# Patient Record
Sex: Female | Born: 1955 | Race: Black or African American | Hispanic: No | Marital: Married | Smoking: Never smoker
Health system: Southern US, Community
[De-identification: ages and names within clinical notes are randomized; demographics above are authoritative.]

## PROBLEM LIST (undated history)

## (undated) DIAGNOSIS — F419 Anxiety disorder, unspecified: Secondary | ICD-10-CM

## (undated) DIAGNOSIS — M542 Cervicalgia: Secondary | ICD-10-CM

## (undated) DIAGNOSIS — G629 Polyneuropathy, unspecified: Secondary | ICD-10-CM

## (undated) DIAGNOSIS — R768 Other specified abnormal immunological findings in serum: Secondary | ICD-10-CM

## (undated) DIAGNOSIS — E785 Hyperlipidemia, unspecified: Secondary | ICD-10-CM

## (undated) DIAGNOSIS — D649 Anemia, unspecified: Secondary | ICD-10-CM

## (undated) DIAGNOSIS — R7689 Other specified abnormal immunological findings in serum: Secondary | ICD-10-CM

## (undated) DIAGNOSIS — J189 Pneumonia, unspecified organism: Secondary | ICD-10-CM

## (undated) DIAGNOSIS — M1990 Unspecified osteoarthritis, unspecified site: Secondary | ICD-10-CM

## (undated) DIAGNOSIS — T8859XA Other complications of anesthesia, initial encounter: Secondary | ICD-10-CM

## (undated) DIAGNOSIS — E042 Nontoxic multinodular goiter: Secondary | ICD-10-CM

## (undated) DIAGNOSIS — T4145XA Adverse effect of unspecified anesthetic, initial encounter: Secondary | ICD-10-CM

## (undated) DIAGNOSIS — IMO0002 Reserved for concepts with insufficient information to code with codable children: Secondary | ICD-10-CM

## (undated) DIAGNOSIS — M797 Fibromyalgia: Secondary | ICD-10-CM

## (undated) DIAGNOSIS — K219 Gastro-esophageal reflux disease without esophagitis: Secondary | ICD-10-CM

## (undated) HISTORY — DX: Nontoxic multinodular goiter: E04.2

## (undated) HISTORY — DX: Other specified abnormal immunological findings in serum: R76.89

## (undated) HISTORY — DX: Hyperlipidemia, unspecified: E78.5

## (undated) HISTORY — DX: Fibromyalgia: M79.7

## (undated) HISTORY — DX: Other specified abnormal immunological findings in serum: R76.8

## (undated) HISTORY — DX: Cervicalgia: M54.2

## (undated) HISTORY — DX: Polyneuropathy, unspecified: G62.9

## (undated) HISTORY — PX: DILATION AND CURETTAGE OF UTERUS: SHX78

## (undated) HISTORY — DX: Reserved for concepts with insufficient information to code with codable children: IMO0002

## (undated) HISTORY — DX: Anemia, unspecified: D64.9

## (undated) HISTORY — PX: TONSILLECTOMY: SUR1361

## (undated) HISTORY — PX: ABDOMINAL HYSTERECTOMY: SUR658

## (undated) HISTORY — PX: ABDOMINAL HYSTERECTOMY: SHX81

## (undated) HISTORY — DX: Unspecified osteoarthritis, unspecified site: M19.90

---

## 1997-10-09 ENCOUNTER — Encounter: Payer: Self-pay | Admitting: Emergency Medicine

## 1997-10-10 ENCOUNTER — Inpatient Hospital Stay (HOSPITAL_COMMUNITY): Admission: EM | Admit: 1997-10-10 | Discharge: 1997-10-10 | Payer: Self-pay | Admitting: Emergency Medicine

## 1998-01-31 ENCOUNTER — Ambulatory Visit (HOSPITAL_COMMUNITY): Admission: RE | Admit: 1998-01-31 | Discharge: 1998-01-31 | Payer: Self-pay | Admitting: Psychology

## 1998-09-12 ENCOUNTER — Encounter: Payer: Self-pay | Admitting: Internal Medicine

## 1998-09-12 ENCOUNTER — Ambulatory Visit (HOSPITAL_COMMUNITY): Admission: RE | Admit: 1998-09-12 | Discharge: 1998-09-12 | Payer: Self-pay | Admitting: Internal Medicine

## 1998-10-08 ENCOUNTER — Other Ambulatory Visit: Admission: RE | Admit: 1998-10-08 | Discharge: 1998-10-08 | Payer: Self-pay | Admitting: Obstetrics and Gynecology

## 1999-11-30 ENCOUNTER — Other Ambulatory Visit: Admission: RE | Admit: 1999-11-30 | Discharge: 1999-11-30 | Payer: Self-pay | Admitting: Obstetrics and Gynecology

## 2000-01-19 HISTORY — PX: MYOMECTOMY: SHX85

## 2000-03-13 ENCOUNTER — Encounter: Payer: Self-pay | Admitting: Internal Medicine

## 2000-03-13 ENCOUNTER — Ambulatory Visit (HOSPITAL_COMMUNITY): Admission: RE | Admit: 2000-03-13 | Discharge: 2000-03-13 | Payer: Self-pay | Admitting: Neurology

## 2000-03-13 ENCOUNTER — Encounter: Payer: Self-pay | Admitting: Neurology

## 2000-03-19 ENCOUNTER — Ambulatory Visit (HOSPITAL_COMMUNITY): Admission: RE | Admit: 2000-03-19 | Discharge: 2000-03-19 | Payer: Self-pay | Admitting: Orthopedic Surgery

## 2000-03-19 ENCOUNTER — Encounter: Payer: Self-pay | Admitting: Orthopedic Surgery

## 2000-08-17 ENCOUNTER — Encounter (INDEPENDENT_AMBULATORY_CARE_PROVIDER_SITE_OTHER): Payer: Self-pay

## 2000-08-17 ENCOUNTER — Ambulatory Visit (HOSPITAL_COMMUNITY): Admission: RE | Admit: 2000-08-17 | Discharge: 2000-08-17 | Payer: Self-pay | Admitting: Obstetrics and Gynecology

## 2000-10-26 ENCOUNTER — Inpatient Hospital Stay (HOSPITAL_COMMUNITY): Admission: RE | Admit: 2000-10-26 | Discharge: 2000-10-28 | Payer: Self-pay | Admitting: Obstetrics and Gynecology

## 2000-10-26 ENCOUNTER — Encounter (INDEPENDENT_AMBULATORY_CARE_PROVIDER_SITE_OTHER): Payer: Self-pay

## 2001-01-25 ENCOUNTER — Encounter (INDEPENDENT_AMBULATORY_CARE_PROVIDER_SITE_OTHER): Payer: Self-pay

## 2001-01-25 ENCOUNTER — Observation Stay (HOSPITAL_COMMUNITY): Admission: RE | Admit: 2001-01-25 | Discharge: 2001-01-26 | Payer: Self-pay | Admitting: Obstetrics and Gynecology

## 2002-01-18 HISTORY — PX: KNEE ARTHROSCOPY: SUR90

## 2002-12-21 ENCOUNTER — Emergency Department (HOSPITAL_COMMUNITY): Admission: EM | Admit: 2002-12-21 | Discharge: 2002-12-21 | Payer: Self-pay | Admitting: Emergency Medicine

## 2002-12-21 ENCOUNTER — Ambulatory Visit (HOSPITAL_COMMUNITY): Admission: RE | Admit: 2002-12-21 | Discharge: 2002-12-21 | Payer: Self-pay | Admitting: Orthopedic Surgery

## 2003-01-30 ENCOUNTER — Encounter: Payer: Self-pay | Admitting: Internal Medicine

## 2003-03-12 ENCOUNTER — Other Ambulatory Visit: Admission: RE | Admit: 2003-03-12 | Discharge: 2003-03-12 | Payer: Self-pay | Admitting: Obstetrics and Gynecology

## 2003-12-09 ENCOUNTER — Encounter: Payer: Self-pay | Admitting: Internal Medicine

## 2004-05-13 ENCOUNTER — Ambulatory Visit: Payer: Self-pay | Admitting: Internal Medicine

## 2004-05-19 ENCOUNTER — Ambulatory Visit: Payer: Self-pay | Admitting: Internal Medicine

## 2005-03-08 ENCOUNTER — Ambulatory Visit: Payer: Self-pay | Admitting: Internal Medicine

## 2005-03-10 ENCOUNTER — Ambulatory Visit (HOSPITAL_COMMUNITY): Admission: RE | Admit: 2005-03-10 | Discharge: 2005-03-10 | Payer: Self-pay | Admitting: Internal Medicine

## 2005-05-18 LAB — CONVERTED CEMR LAB: Pap Smear: NORMAL

## 2005-05-19 ENCOUNTER — Encounter: Payer: Self-pay | Admitting: Internal Medicine

## 2005-06-09 ENCOUNTER — Ambulatory Visit: Payer: Self-pay | Admitting: Cardiology

## 2005-06-09 ENCOUNTER — Emergency Department (HOSPITAL_COMMUNITY): Admission: EM | Admit: 2005-06-09 | Discharge: 2005-06-10 | Payer: Self-pay | Admitting: Emergency Medicine

## 2005-06-21 ENCOUNTER — Ambulatory Visit: Payer: Self-pay | Admitting: Internal Medicine

## 2005-06-22 ENCOUNTER — Ambulatory Visit: Payer: Self-pay | Admitting: Internal Medicine

## 2005-07-01 ENCOUNTER — Ambulatory Visit: Payer: Self-pay

## 2006-05-03 ENCOUNTER — Ambulatory Visit: Payer: Self-pay | Admitting: Internal Medicine

## 2006-10-31 IMAGING — CR DG CHEST 2V
2 series · 2 of 2 positions shown · non-contrast
Comparison: None.

CLINICAL DATA: 49-year-old with chest pain and anxiety.  
 CHEST - 2 VIEW:

[w chest pa]
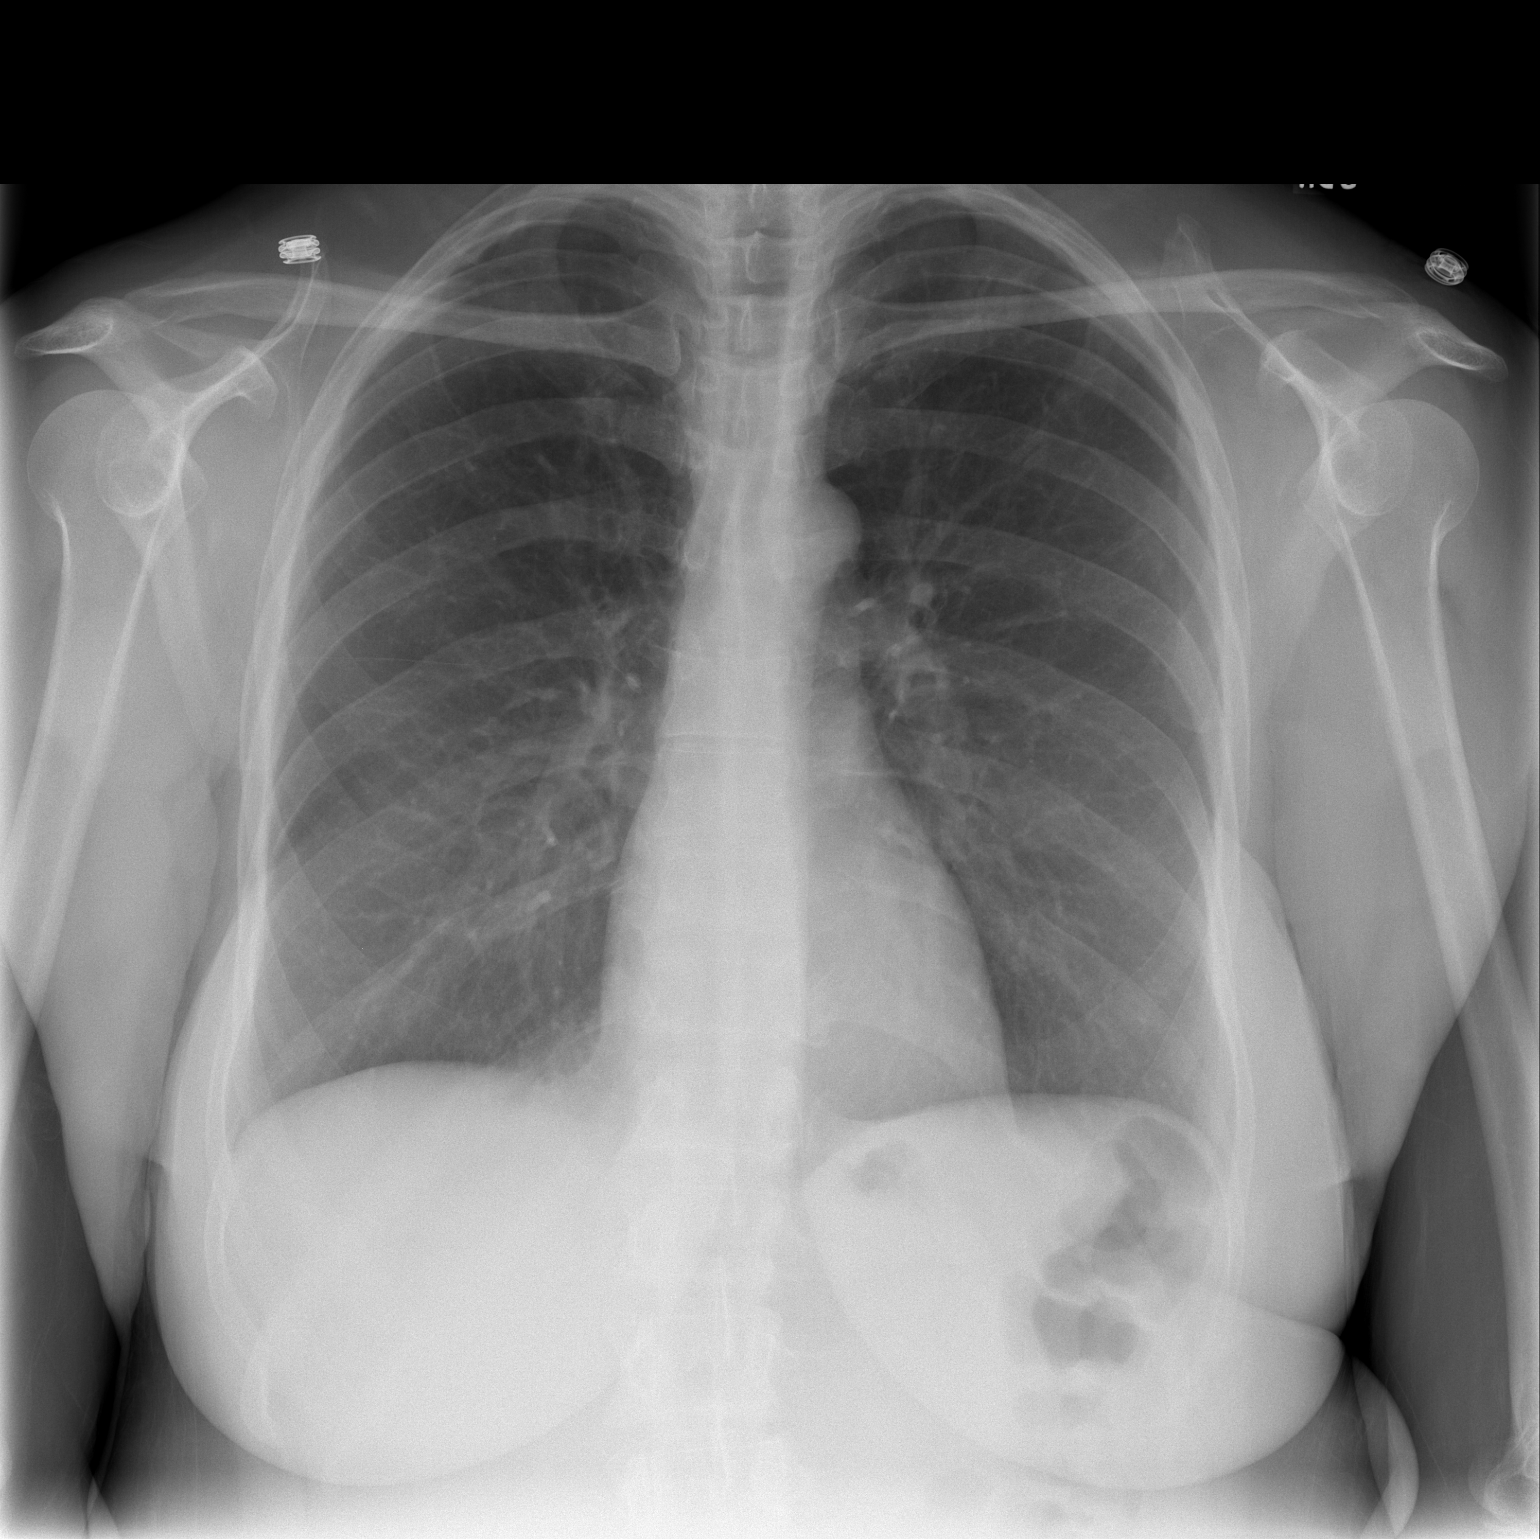

[w chest lat]
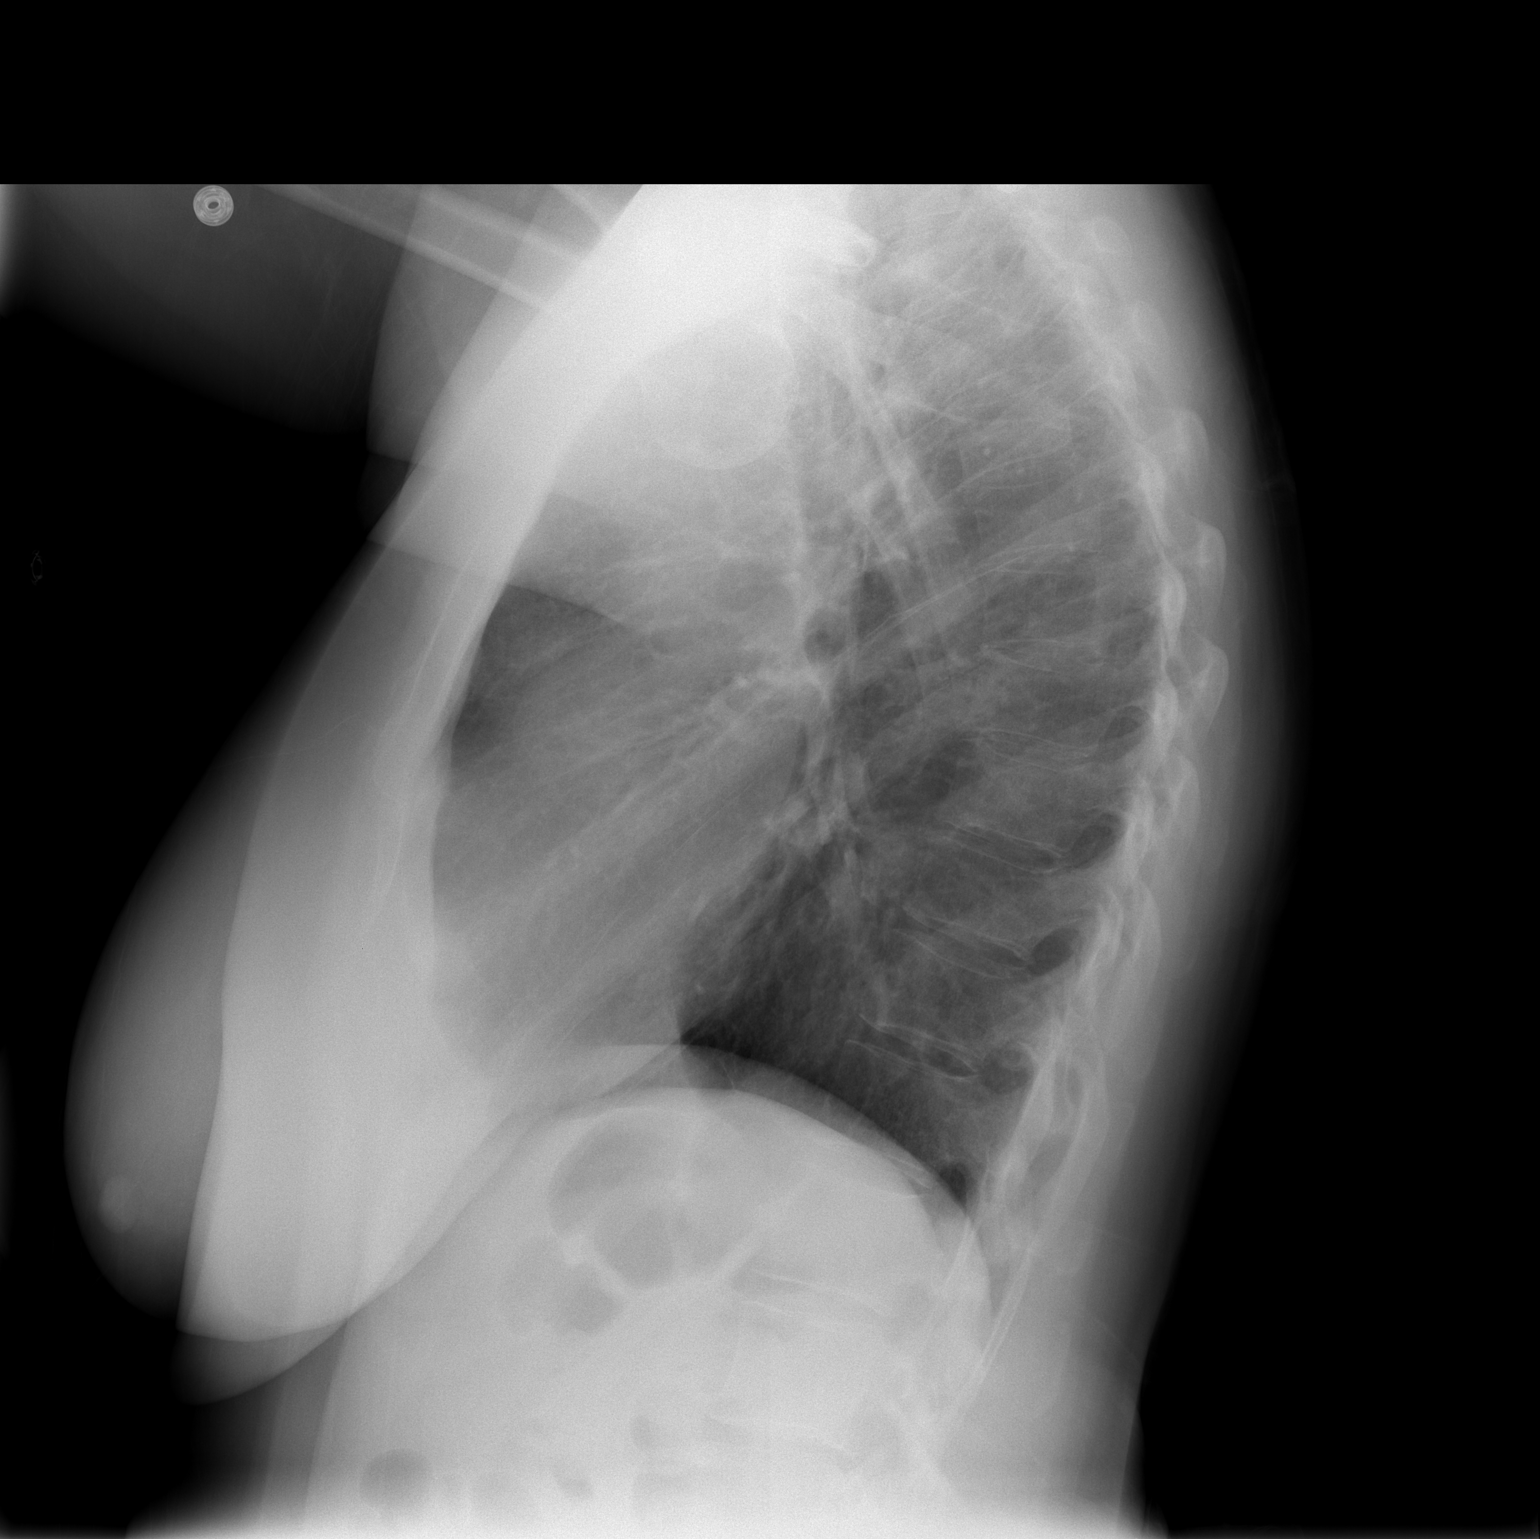

[2 of 2 positions shown; findings below may reference images not displayed]

The heart size and mediastinal contours are within normal limits.  Both lungs are clear.  The visualized skeletal structures are unremarkable.
IMPRESSION: No active cardiopulmonary disease.

## 2007-01-05 ENCOUNTER — Encounter (INDEPENDENT_AMBULATORY_CARE_PROVIDER_SITE_OTHER): Payer: Self-pay | Admitting: *Deleted

## 2007-01-05 ENCOUNTER — Ambulatory Visit: Payer: Self-pay | Admitting: Internal Medicine

## 2007-01-05 DIAGNOSIS — F519 Sleep disorder not due to a substance or known physiological condition, unspecified: Secondary | ICD-10-CM | POA: Insufficient documentation

## 2007-01-05 DIAGNOSIS — M545 Low back pain, unspecified: Secondary | ICD-10-CM | POA: Insufficient documentation

## 2007-01-05 DIAGNOSIS — E785 Hyperlipidemia, unspecified: Secondary | ICD-10-CM | POA: Insufficient documentation

## 2007-01-05 DIAGNOSIS — D509 Iron deficiency anemia, unspecified: Secondary | ICD-10-CM | POA: Insufficient documentation

## 2007-01-05 DIAGNOSIS — J309 Allergic rhinitis, unspecified: Secondary | ICD-10-CM | POA: Insufficient documentation

## 2007-01-05 DIAGNOSIS — M797 Fibromyalgia: Secondary | ICD-10-CM | POA: Insufficient documentation

## 2007-01-05 DIAGNOSIS — K581 Irritable bowel syndrome with constipation: Secondary | ICD-10-CM | POA: Insufficient documentation

## 2007-01-05 DIAGNOSIS — L989 Disorder of the skin and subcutaneous tissue, unspecified: Secondary | ICD-10-CM | POA: Insufficient documentation

## 2007-01-05 LAB — CONVERTED CEMR LAB
ALT: 16 units/L (ref 0–35)
AST: 18 units/L (ref 0–37)
Alkaline Phosphatase: 45 units/L (ref 39–117)
Bilirubin Urine: NEGATIVE
Bilirubin, Direct: 0.2 mg/dL (ref 0.0–0.3)
CO2: 27 meq/L (ref 19–32)
Calcium: 10.1 mg/dL (ref 8.4–10.5)
Chloride: 103 meq/L (ref 96–112)
Cholesterol: 204 mg/dL (ref 0–200)
Creatinine, Ser: 0.6 mg/dL (ref 0.4–1.2)
Direct LDL: 111.9 mg/dL
Eosinophils Absolute: 0 10*3/uL (ref 0.0–0.6)
Eosinophils Relative: 1 % (ref 0.0–5.0)
Ketones, ur: NEGATIVE mg/dL
Lymphocytes Relative: 42.1 % (ref 12.0–46.0)
MCV: 85.7 fL (ref 78.0–100.0)
Monocytes Absolute: 0.3 10*3/uL (ref 0.2–0.7)
Monocytes Relative: 9.4 % (ref 3.0–11.0)
Neutro Abs: 1.8 10*3/uL (ref 1.4–7.7)
Potassium: 3.7 meq/L (ref 3.5–5.1)
RDW: 11.8 % (ref 11.5–14.6)
Sodium: 137 meq/L (ref 135–145)
Triglycerides: 45 mg/dL (ref 0–149)
Urine Glucose: NEGATIVE mg/dL
Urobilinogen, UA: 0.2 (ref 0.0–1.0)
pH: 7 (ref 5.0–8.0)

## 2007-03-16 ENCOUNTER — Ambulatory Visit: Payer: Self-pay | Admitting: Gastroenterology

## 2007-03-16 LAB — CONVERTED CEMR LAB
Ferritin: 100.9 ng/mL (ref 10.0–291.0)
Folate: 11.4 ng/mL
Iron: 119 ug/dL (ref 42–145)
Saturation Ratios: 36.8 % (ref 20.0–50.0)
Sed Rate: 11 mm/hr (ref 0–25)
Transferrin: 230.8 mg/dL (ref >=212.0)
Vitamin B-12: 447 pg/mL (ref 211–911)

## 2007-04-10 ENCOUNTER — Ambulatory Visit: Payer: Self-pay | Admitting: Gastroenterology

## 2007-04-24 ENCOUNTER — Ambulatory Visit: Payer: Self-pay | Admitting: Gastroenterology

## 2007-04-24 ENCOUNTER — Encounter: Payer: Self-pay | Admitting: Internal Medicine

## 2007-05-11 ENCOUNTER — Encounter: Payer: Self-pay | Admitting: Internal Medicine

## 2007-09-08 ENCOUNTER — Telehealth (INDEPENDENT_AMBULATORY_CARE_PROVIDER_SITE_OTHER): Payer: Self-pay | Admitting: *Deleted

## 2007-10-03 ENCOUNTER — Ambulatory Visit: Payer: Self-pay | Admitting: Internal Medicine

## 2007-10-03 DIAGNOSIS — G609 Hereditary and idiopathic neuropathy, unspecified: Secondary | ICD-10-CM | POA: Insufficient documentation

## 2007-10-18 ENCOUNTER — Encounter: Payer: Self-pay | Admitting: Internal Medicine

## 2008-05-11 ENCOUNTER — Encounter: Payer: Self-pay | Admitting: Internal Medicine

## 2008-07-11 ENCOUNTER — Ambulatory Visit: Payer: Self-pay | Admitting: Internal Medicine

## 2008-08-15 ENCOUNTER — Encounter: Payer: Self-pay | Admitting: Internal Medicine

## 2008-11-04 ENCOUNTER — Telehealth: Payer: Self-pay | Admitting: Gastroenterology

## 2009-01-18 HISTORY — PX: BUNIONECTOMY: SHX129

## 2009-06-04 ENCOUNTER — Ambulatory Visit: Payer: Self-pay | Admitting: Internal Medicine

## 2009-06-05 LAB — CONVERTED CEMR LAB
Alkaline Phosphatase: 66 units/L (ref 39–117)
Basophils Absolute: 0 10*3/uL (ref 0.0–0.1)
Basophils Relative: 0.9 % (ref 0.0–3.0)
Bilirubin, Direct: 0.1 mg/dL (ref 0.0–0.3)
Creatinine, Ser: 0.5 mg/dL (ref 0.4–1.2)
Direct LDL: 127.9 mg/dL
Eosinophils Absolute: 0.1 10*3/uL (ref 0.0–0.7)
GFR calc non Af Amer: 170.6 mL/min (ref >60)
Hemoglobin, Urine: NEGATIVE
Hemoglobin: 12.4 g/dL (ref 12.0–15.0)
Ketones, ur: NEGATIVE mg/dL
Lymphs Abs: 2 10*3/uL (ref 0.7–4.0)
MCHC: 33.7 g/dL (ref 30.0–36.0)
MCV: 85.5 fL (ref 78.0–100.0)
Monocytes Absolute: 0.4 10*3/uL (ref 0.1–1.0)
Monocytes Relative: 8.5 % (ref 3.0–12.0)
Neutro Abs: 2.2 10*3/uL (ref 1.4–7.7)
Neutrophils Relative %: 46.3 % (ref 43.0–77.0)
Platelets: 333 10*3/uL (ref 150.0–400.0)
Potassium: 4.1 meq/L (ref 3.5–5.1)
RDW: 12.9 % (ref 11.5–14.6)
TSH: 1.29 microintl units/mL (ref 0.35–5.50)
Total Protein, Urine: NEGATIVE mg/dL
Urine Glucose: NEGATIVE mg/dL
VLDL: 10.6 mg/dL (ref 0.0–40.0)
WBC: 4.7 10*3/uL (ref 4.5–10.5)
pH: 7 (ref 5.0–8.0)

## 2010-02-02 ENCOUNTER — Encounter: Payer: Self-pay | Admitting: Internal Medicine

## 2010-02-17 NOTE — Assessment & Plan Note (Signed)
Summary: f/u appt/#/cd   Vital Signs:  Patient profile:   55 year old female Height:      63.5 inches Weight:      167.13 pounds BMI:     29.25 O2 Sat:      95 % on Room air Temp:     97.6 degrees F oral Pulse rate:   66 / minute BP sitting:   110 / 70  (left arm) Cuff size:   regular  Vitals Entered ByZella Ball Ewing (2009/06/13 4:13 PM)  O2 Flow:  Room air  Preventive Care Screening  Colonoscopy:    Date:  04/19/2007    Next Due:  04/2017    Results:  normal   Mammogram:    Date:  05/24/2009    Results:  normal   Pap Smear:    Date:  06/18/2008    Results:  normal   CC: followup/RE   CC:  followup/RE.  History of Present Illness: overalldoing well, no complaints.  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 3 Times Daily 2)  Savella 50 Mg Tabs (Milnacipran Hcl) .Marland Kitchen.. 1 By Mouth Two Times A Day 3)  Tussionex Pennkinetic Er 8-10 Mg/23ml Lqcr (Chlorpheniramine-Hydrocodone) .Marland Kitchen.. 1 Tsp By Mouth Two Times A Day As Needed  Current Medications (verified): 1)  Lyrica 100 Mg Caps (Pregabalin) .... 5 Times Daily  Allergies (verified): 1)  ! Penicillin 2)  ! Azithromycin (Azithromycin) 3)  ! * Cymbalta 4)  ! Ambien Cr (Zolpidem Tartrate)  Past History:  Past Medical History: Last updated: 07/11/2008 fibromyalgia DJD Low back pain chronic cervicalgia DDD  cervical Allergic rhinitis Anemia-iron deficiency Hyperlipidemia ANA positive multinodular thyroid Peripheral neuropathy  Past Surgical History: Last updated: 01/05/2007 Hysterectomy  Family History: Last updated: 06/13/2009 father and uncle died with heart disease in their 27's sickle cell trait lung cancer goiter mother with DM aunt with colon cancer  Social History: Last updated: 2009-06-13 Never Smoked Alcohol use-yes Married 2 children work - [employer]  Risk Factors: Smoking Status: never (01/05/2007)  Family History: Reviewed history from 01/05/2007 and no changes required. father and uncle died with heart disease in their 75's sickle cell trait lung cancer goiter mother with DM aunt with colon cancer  Social History: Reviewed history from 10/03/2007 and no changes required. Never Smoked Alcohol use-yes Married 2 children work - [employer]  Review of Systems  The patient denies anorexia, fever, weight loss, vision loss, decreased hearing, hoarseness, chest pain, syncope, dyspnea on exertion, peripheral edema, prolonged cough, headaches, hemoptysis, abdominal pain, melena, hematochezia, severe indigestion/heartburn, hematuria, muscle weakness, transient blindness, difficulty walking, depression, unusual weight change, abnormal bleeding, enlarged lymph nodes, angioedema, and breast masses.         all otherwise negative per pt -  still wtih right distal leg skin lesion - requests derm eval  Physical Exam  General:  alert and  overweight-appearing.   Head:  normocephalic and atraumatic.   Eyes:  vision grossly intact, pupils equal, and pupils round.   Ears:  R ear normal and L ear normal.   Nose:  no external deformity and no nasal discharge.   Mouth:  no gingival abnormalities and pharynx pink and moist.   Neck:  supple and no masses.   Lungs:  normal respiratory effort and normal breath sounds.   Heart:  normal rate and regular rhythm.    Abdomen:  soft, non-tender, and normal bowel sounds.   Msk:  no joint tenderness and no joint swelling.   Extremities:  no edema, no erythema  Neurologic:  cranial nerves II-XII intact and strength normal in all extremities.   Skin:  color normal.  , has several lesions to right ant distal thigh, dark, small somewhat raised Psych:  memory intact for recent and remote, normally interactive, and slightly anxious.     Impression & Recommendations:  Problem # 1:  Preventive Health Care (ICD-V70.0)  Overall doing well, age appropriate education and counseling updated and referral for appropriate preventive services done unless declined, immunizations up to date or declined, diet counseling done if overweight, urged to quit smoking if smokes , most recent labs reviewed and current ordered if appropriate, ecg reviewed or declined (interpretation per ECG scanned in the EMR if done); information regarding Medicare Prevention requirements given if appropriate; speciality referrals updated as appropriate   Orders: TLB-BMP (Basic Metabolic Panel-BMET) (80048-METABOL) TLB-CBC Platelet - w/Differential (85025-CBCD) TLB-Hepatic/Liver Function Pnl (80076-HEPATIC) TLB-Lipid Panel (80061-LIPID) TLB-TSH (Thyroid Stimulating Hormone) (84443-TSH) TLB-Udip ONLY (81003-UDIP) T-Vitamin D (25-Hydroxy) (32355-73220)  Problem # 2:  SKIN LESION (ICD-709.9)  right distal leg - refer to derm  Orders: Dermatology Referral (Derma)  Complete Medication List: 1)  Lyrica 100 Mg Caps (Pregabalin) .... 5 times daily  Patient Instructions: 1)  Continue all previous medications as before this visit  2)  Please go to the Lab in the basement for your blood and/or urine tests today 3)  You will be contacted about the referral(s) to: dermatology 4)  Please schedule a follow-up appointment in 1 year or sooner if needed

## 2010-02-18 ENCOUNTER — Encounter: Payer: Self-pay | Admitting: Internal Medicine

## 2010-02-19 NOTE — Letter (Signed)
Summary: [facility]  [facility]   Imported By: Lennie Odor 02/11/2010 15:15:49  _____________________________________________________________________  External Attachment:    Type:   Image     Comment:   External Document

## 2010-02-20 ENCOUNTER — Encounter: Payer: Self-pay | Admitting: Internal Medicine

## 2010-03-05 NOTE — Letter (Signed)
Summary: [facility]  [facility]   Imported By: Sherian Rein 02/27/2010 11:27:55  _____________________________________________________________________  External Attachment:    Type:   Image     Comment:   External Document

## 2010-03-05 NOTE — Letter (Signed)
Summary: [facility]  External Correspondence   Imported By: Sherian Rein 02/25/2010 15:47:46  _____________________________________________________________________  External Attachment:    Type:   Image     Comment:   External Document

## 2010-06-02 NOTE — Assessment & Plan Note (Signed)
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 She is referred today through the courtesy of Dr.  Jonny Ruiz for evaluation of refractory constipation going back to childhood.   Gabrielle Benson has had constipation and had to use laxatives and enemas all  of her life. She has recently had left lower quadrant pain radiating  into her left back and down her left hip area with some hemorrhoidal  type bleeding. She may go as long as a week without having a bowel  movement and then has associated abdominal gas, bloating and left lower  quadrant discomfort, but no nausea or vomiting or systemic complaints.  She has voluntarily lost 30 pounds in the last 18 months because of her  caloric restriction and has a good appetite and denies any specific food  intolerances. She has no upper GI or hepatobiliary complaints except for  some occasional dysphagia in her throat related to apparently scoliosis  and thyromegaly with a known asymptomatic thyroid goiter. The patient  has never had barium studies or colonoscopy examinations.   She denies clay-colored stools, dark urine, icterus, fever or chills.  She has no sense of inflammatory bowel disease except for occasional  aphthous erosions in her mouth.   PAST MEDICAL HISTORY:  1. Chronic thyroid dysfunction and an asymptomatic goiter.  2. Essential hypertension.  3. Hypercholesterolemia.  4. Fibromyalgia.  5. History of chronic anemia.  6. History of degenerative disease of her back and degenerative      arthritis.  7. History of positive ANA of questionable significance.  8. History of previous partial hysterectomy and myomectomy.  9. She has suffered from uterine  fibroids.   CURRENT MEDICATIONS:  1. Lyrica 50 mg twice a day.  2. P.r.n. trazodone.  3. Dulcolax.  4. Fleets enemas.  5. Preparation H.   In the past has had reactions to:  PENICILLIN AND AMPICILLIN.   FAMILY HISTORY:  Remarkable for an aunt with colon cancer in her mid  62s. Family history is also remarkable for diabetes in her mother,  atherosclerosis in her father and breast cancer in an aunt.   SOCIAL HISTORY:  She is married and lives with her husband and children.  She has college education. She does not smoke and uses ethanol socially.   REVIEW OF SYSTEMS:  Remarkable for chronic low back pain, scoliosis of  her spine and chronic fatigue. She denies any current cardiovascular,  pulmonary, genitourinary, neurologic, orthopedic or psychiatric  problems.   She is a healthy-appearing African American female in no acute distress,  appearing her stated age. She is 5 feet, 3 inches and weighs 146 pounds.  Blood pressure 110/70 and pulse was 78 and regular. I cannot appreciate  stigmata of chronic liver disease. She did have a symmetrically +1-+2  enlarged thyroid goiter which was nontender.  CHEST: Was entirely clear.  She was  in a regular rhythm without murmurs, gallops or rubs.  There is no abdominal distention, organomegaly, masses, or tenderness.  Bowel sounds were positive.  Inspection of the rectum was unremarkable as was rectal examination. She  did have a rather weak anal squeeze pressure. There were no rectal  masses or tenderness and stool was guaiac negative. There was no  evidence of a fecal impaction.  Peripheral extremities were unremarkable.  Mental status was normal.   ASSESSMENT:  1. 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 There is certainly      nothing to suggest inflammatory bowel disease, but she has not had      colonoscopy screening. Recent blood work and thyroid function  tests      were normal except for slight anemia with a hemoglobin of 11.5.  2. Normochromic normocytic anemia, rule out mixed anemia.  3. History of scoliosis and cervical neck deformity causing some minor      dysphagia. The patient has no history of chronic acid reflux      symptoms.  4. History of PENICILLIN AND AMPICILLIN ALLERGY.  5. Family history of colon carcinoma in an aunt in her mid 53s.  6. Family history of breast cancer in an aunt.  7. History of fibromyalgia and degenerative disease of her neck and      spine.  8  Nodular goiter   RECOMMENDATIONS:  1. High fiber diet with daily Benefiber and liberal p.o. fluids.  2. Trial of MiraLax 8 ounces at bedtime.  3. Check anemia profile.  4. Outpatient colonoscopy.  5. Consider Amitiza 24 mcg twice a day.  6. Continue other medications per Dr. Jonny Ruiz.     Gabrielle Benson. Gabrielle Motto, MD, Caleen Essex, FAGA  Electronically Signed    DRP/MedQ  DD: 03/16/2007  DT: 03/16/2007  Job #: [id number]   cc:   Corwin Levins, MD  Eliberto Ivory Rosalio Macadamia, M.D.

## 2010-06-05 NOTE — Op Note (Signed)
[facility]  Patient:    Gabrielle Benson, Gabrielle Benson Visit Number: [id number] MRN: [id number]          Service Type: GYN Location: 9300 [id number] Attending Physician:  Morene Antu Dictated by:   Sherry A. Rosalio Macadamia, M.D. Proc. Date: 10/26/00 Admit Date:  10/26/2000                             Operative Report  PREOPERATIVE DIAGNOSES:       1. Fibroid uterus.                               2. Pelvic pain.                               3. Menorrhagia.  POSTOPERATIVE DIAGNOSES:      1. Fibroid uterus.                               2. Pelvic pain.                               3. Menorrhagia.  PROCEDURE:                    Myomectomy.  SURGEON:                      Sherry A. Rosalio Macadamia, M.D.  ASSISTANT:                    Sheronette A. Cherly Hensen, M.D.  ANESTHESIA:                   General.  INDICATIONS:                  This is a 55 year old, G4, P2-0-2-2, woman who has had known fibroids for many years. The patient started with excessively heavy bleeding approximately a year ago. She was evaluated with ultrasound which diagnosed large fibroids present. Initially, the patient was treated with Lupron x 3 doses, then she was brought to the operating room in August for a D&C/hysteroscopy with a resectoscope. Large amount of submucosal fibroid tissue was removed. Postoperatively, the patient did well, however, as the Lupron was wearing off the patient started getting significant pelvic pain. Because of the presence of the pelvic pain and increased size fibroids, the patient has requested removal of the fibroids. Therefore, she is brought to the operating room for myomectomy. The patient requests not having her uterus removed unless absolutely necessary.  FINDINGS:                     A 10 to 11-week size uterus with multiple fibroids present, the largest being approximately 6 to 7 cm each. Normal fallopian tubes and ovaries, normal  appendix.  DESCRIPTION OF PROCEDURE:     The patient was brought into the operating room and given adequate general anesthesia. She was placed in a frog leg position. Her abdomen and vagina were washed with Betadine. The bladder was catheterized. A speculum was placed within the vagina. The cervix was grasped with a single tooth tenaculum. Pediatric Foley was introduced into the endometrial cavity and balloon was inflated. Dye was  attempted to be introduced, however, the mechanism was leaking, therefore, the pediatric Foley was removed and a single acorn cannula was placed in the endocervical cavity. The speculum was removed. The surgeons gown and gloves were changed. The patient was draped in a sterile fashion. Using 1% Marcaine the area that was later incised was infiltrated. A Pfannenstiel incision was made and brought down sharply to the fascia. The fascia was incised. The fascia was elevated off the rectus muscles sharply. Bleeders were cauterized. Rectus muscles were separated bluntly. The peritoneum was identified and entered bluntly. The peritoneal incision was made superiorly and inferiorly. Balfour retractor was placed in the abdominal cavity. The bowel was packed off with wet lap pads. The uterus was inspected. It was felt that the largest fibroid was posterior, fundal. Using Pitressin solution, the serosa over the fibroid was infiltrated. The serosa was then incised. A corkscrew was placed in the fibroid. The fibroid was dissected with blunt and sharp dissection. Both fibroids that were degenerating seem to be attached and the fibroids were removed together. Dye was injected into the acorn cannula and the endometrial cavity was identified. It was felt that the fibroids had been removed from the endometrial cavity as well. The uterus was inspected. Some bleeders were cauterized and some significant bleeders were closed with O Vicryl figure-of-eight stitches  for hemostasis.  The uterus was then closed by identifying the endometrial cavity and closing the myometrium above it. This was done in interrupted stitches. Once the defect was completely closed in this fashion except for the serosa, the serosa was then closed with 3-0 Maxon in a baseball-type stitch. Adequate hemostasis was present. Two small superficial fibroids were cauterized to dessicate them. Although there were some other 1 to 2-cm fibroids present, we decided not to remove those. It was felt that they were not involved with either her pain or her heavy bleeding. It was felt that is these fibroids were removed, it could cause more difficulty with her attempting pregnancy in the future; therefore, they were left in place. The abdominal cavity was irrigated with large amounts of warm saline. Fallopian tubes and ovaries were inspected and felt to be normal. The appendix was visualized; it was normal. All packs were removed from the abdomen. The Balfour retractor was removed. Interceed was placed against the uterine incision to prevent adhesions. Bleeders were cauterized along the peritoneal and fascial edges. The fascia was cauterized for any bleeders. The fascia was then closed with O Vicryl in running lock stitches running laterally to midline. Incision was irrigated. Bleeders were cauterized. The skin was closed with staples. A sterile bandage was placed over the wound. The acorn tenaculum and single tooth tenaculum were removed from the vagina. The patient was then awakened. She was extubated. She was moved from the operating table to a stretcher in stable condition.  COMPLICATIONS:                None.  ESTIMATED BLOOD LOSS:         50 cc. Dictated by:   Sherry A. Rosalio Macadamia, M.D. Attending Physician:  Morene Antu DD:  10/26/00 TD:  10/26/00 Job: [id number]

## 2010-06-05 NOTE — Op Note (Signed)
[facility]  Patient:    Gabrielle Benson, Gabrielle Benson                 MRN: [id number] Proc. Date: 08/17/00 Adm. Date:  [id number] Attending:  Morene Antu                           Operative Report  PREOPERATIVE DIAGNOSES:       1. Menorrhagia.                               2. Fibroid uterus.  POSTOPERATIVE DIAGNOSES:      1. Menorrhagia.                               2. Fibroid uterus.  PROCEDURE:                    Dilation and curettage hysteroscopy with the resectoscope.  SURGEON:                      Sherry A. Rosalio Macadamia, M.D.  ANESTHESIA:                   General.  INDICATIONS:                  This is a 55 year old G79, P2-0-2-2 woman who is complaining of excessively heavy bleeding for several years.  It has become significantly worse over the previous nine months.  The patient had ultrasound performed, which showed multiple fibroids.  The largest was 6.3 x 6.5 x 5.3 and was felt to be submucosal.  The patient was treated with Lupron therapy for approximately four months and repeat ultrasound was performed.  The fibroids had decreased in size.  The submucosal fibroid was 5.7 x 4.4 x 5.7 and was felt to be at the fundus of the uterus.  The patient had elected to have a D&C hysteroscopy with resectoscopic excision of fibroids.  Therefore, she is brought to the operating room for this procedure.  FINDINGS:                     Nine-week-sized, anteflexed uterus.  No adnexal mass.  Small submucosal fibroid present at the top of the endometrial cavity. A large intramural fibroid at the fundus and to the left of the cavity that is partially submucosal.  DESCRIPTION OF PROCEDURE:     The patient was brought into the operating room and given adequate general anesthesia.  She was placed in the dorsal lithotomy position.  The perineum and vagina were washed with Betadine.  The bladder was in-and-out catheterized.  Pelvic examination was  performed.  The patient was draped in a sterile fashion.  A speculum was placed within the vagina. Paracervical block was administered with 1% Nesacaine.  The anterior lip of the cervix was grasped with a single-tooth tenaculum.  The cervix was sounded. The cervix was dilated with Shawnie Pons dilators to a #29.  The hysteroscope was introduced into the endometrial cavity.  Pictures were obtained.  Using a double-loop, right-angle resector, the small submucosal fibroid and a portion of the submucosal/intramural fibroid were removed.  All bleeders were cauterized.  Pictures were obtained before and after surgery.  The entire submucosal intramural fibroid could not be removed because it was felt to be dangerous  to digoxin into that portion of the cavity.  Once adequate hemostasis was present, all instruments were removed from the vagina.  The patient was taken out of the dorsal lithotomy position.  She was awakened, extubated and moved from the operating table to a stretcher in stable condition.  COMPLICATIONS:                None.  ESTIMATED BLOOD LOSS:         Less than 5 cc.  SORBITOL DIFFERENTIAL:        -20 cc. DD:  08/17/00 TD:  08/17/00 Job: [id number]

## 2010-06-05 NOTE — Discharge Summary (Signed)
[facility]  Patient:    Gabrielle Benson, Gabrielle Benson Visit Number: [id number] MRN: [id number]          Service Type: GYN Location: 9300 [id number] Attending Physician:  Morene Antu Dictated by:   Sherry A. Rosalio Macadamia, M.D. Admit Date:  10/26/2000 Discharge Date: 10/28/2000                             Discharge Summary  PROBLEM:  Fibroid uterus.  HISTORY OF PRESENT ILLNESS:  The patient is a 55 year old, G4, P2-0-2-2 woman who has had known fibroids for many years.  The patient has had excessively heavy bleeding which has started approximately one year ago.  She was evaluated with an ultrasound which diagnosed large fibroids.  She was initially treated with Lupron, and had a D&C hysteroscopy with resectoscope. A large amount of submucosal fibroid tissue was removed, however, as the Lupron was wearing off the patient started to have significant pain and heavy bleeding again.  The fibroids increased in size as the Lupron was wearing off. Therefore, the patient is brought to the operating room for myomectomy.  The patient request conservative surgery only.  PHYSICAL EXAMINATION:  HEENT:  Within normal limits.  NECK:  Without any lymphadenopathy, thyroid without nodule.  CHEST:  Clear to auscultation.  HEART:  Regular rhythm without murmur.  BREASTS:  Without mass.  ABDOMEN:  Soft, nontender, without mass.  PELVIC:  External genitalia within normal limits.  Vagina within normal limits.  Cervix normal.  Uterus 9 to 10 weeks size.  Adnexa without mass.  HOSPITAL COURSE:  The patient was admitted and brought to the operating room where an abdominal myomectomy was performed.  There were no complications from surgery, and postoperatively the patient did well.  She remained afebrile, and her vital signs remained stable.  Preoperative hematocrit was 35.7, postoperative hematocrit on the first postoperative day was 27.9.  Pathology report was  reported several weeks after surgery including cellular, mytotically active leiomyoma as read out by Dr. Ursula Beath at [facility].  The patient was discharged to home on her second postoperative day.  ASSESSMENT:  Stable, status post abdominal myomectomy.  PLAN:  Follow up in the office in three weeks.  The patient will call if she has a temperature greater than 101, heavy bleeding, or severe pain.  DISCHARGE MEDICATIONS:  She will use Advil, Aleve, or Darvocet-N 100 for pain relief postoperatively. Dictated by:   Sherry A. Rosalio Macadamia, M.D. Attending Physician:  Morene Antu DD:  12/07/00 TD:  12/07/00 Job: [id number]

## 2010-06-05 NOTE — Op Note (Signed)
[facility]  Patient:    Gabrielle Benson, Gabrielle Benson Visit Number: [id number] MRN: [id number]          Service Type: DSU Location: [id number] Attending Physician:  Morene Antu Dictated by:   Sherry A. Rosalio Macadamia, M.D. Proc. Date: 01/25/01 Admit Date:  01/25/2001                             Operative Report  PREOPERATIVE DIAGNOSES:       Cellular mitotically active leiomyoma.  POSTOPERATIVE DIAGNOSES:      Cellular mitotically active leiomyoma.  PROCEDURE:                    Laparoscopically assisted vaginal hysterectomy.  SURGEON:                      Sherry A. Rosalio Macadamia, M.D., Sheronette A. Cherly Hensen, M.D.  ANESTHESIA:                   General.  INDICATIONS:                  This is a 55 year old G27, P2-0-2-2 woman who has had a history of fibroid uterus.  Patient underwent a myomectomy in October 2002.  Diagnosis showed cellular mitotically active leiomyoma.  Although this was not a true sarcoma, it was felt to be dangerous to leave the uterus in place with probable remaining tissue from this fibroid.  It was felt that it could develop into a more serious problem.  Therefore, the patient was brought to the operating room for LAVH.  FINDINGS:                     A 9 week sized uterus with soft tissue present, normal tubes and ovaries, normal appendix, omental adhesions to the anterior abdominal wall.  PROCEDURE:                    Patient was brought into the operating room and given adequate general anesthesia.  She was placed in the dorsal lithotomy position.  Abdomen and vagina were washed with Hibiclens.  Foley catheter was inserted into the bladder.  Speculum was placed within the vagina.  Cervix was grasped with a single tooth Hulka tenaculum.  Speculum was removed.  Patient was draped in a sterile fashion.  Surgeons gown and gloves were changed. Subumbilical area was infiltrated with 0.25% Marcaine.  A subumbilical incision  was made.  This was brought down sharply to the fascia.  Fascia was grasped with Kocher clamps.  Fascia was incised.  Peritoneal cavity was entered bluntly.  There were some omental adhesions above the incision.  The fascia was grasped with 0 Vicryl figure-of-eight stitches x2.  The Hasson trocar was introduced into the peritoneal cavity.  Carbon dioxide was insufflated.  A right lateral incision was made after infiltrating with 0.25% Marcaine and visualizing for any vessels.  A trocar was placed under direct visualization.  Same procedure was performed on the left.  The pelvis was inspected.  The right round ligament was grasped and cauterized with tripolar and cut in the middle.  The right fallopian tube was grasped, cut, cauterized x3, and cut in the middle and the utero-ovarian ligaments were continued to be cauterized and cut in this fashion.  The ureter was identified well below the dissection.  The dissection and cautery was continued to the  uterus just above the uterine arteries.  The same procedure was then performed on the left.  A small incision was made at the junction of the lower uterine segment in the peritoneum.  This was hydroinsufflated and the peritoneum was incised. Adequate hemostasis was felt to be present.  Irrigation was suctioned.  All carbon dioxide was allowed to escape and the vaginal procedure was then performed.  The patient was draped for the vaginal procedure.  Weighted speculum was placed within the vagina.  Using 1% Xylocaine with epinephrine the cervix was infiltrated circumferentially.  Cervix was incised circumferentially.  The vaginal mucosa was dissected off of the cervix with blunt dissection.  The posterior cul-de-sac was identified and entered sharply.  Uterosacral ligaments were clamped, cut, and suture ligated with 0 Vicryl ligatures.  The posterior cuff was then closed with 0 Vicryl in a running lock stitched.  Long nosed weighted speculum was  then placed within the vagina into the peritoneal cavity.  The bladder was dissected off of the cervix with blunt dissection and the peritoneal cavity was entered anteriorly. Using a LigaSure the cardinal ligaments were clamped, cauterized, and then cut on alternating sides.  Uterine arteries were cauterized in a double cautery before cutting.  The uterus was then removed through the vagina.  Small amount of bleeding was present around the cuff.  This was closed with 0 Vicryl in figure-of-eight stitches.  Adequate hemostasis was present.  The vaginal cuff was then closed with 0 Vicryl figure-of-eight stitches in a vertical fashion. Adequate hemostasis was present.  All instruments were removed from the vagina.  Surgeons gloves were changed.  The laparoscope was reintroduced into the abdominal cavity and the carbon dioxide was reinsufflated.  The pelvis was inspected.  A very small amount of bleeding near the right ovary and round ligament.  These were cauterized.  Adequate hemostasis was present.  Pictures were obtained throughout.  Carbon dioxide was allowed to escape.  The lateral ports were removed under direct visualization with no bleeding present.  The remaining carbon dioxide was allowed to escape.  The Hasson trocar was removed.  The fascia was closed by tying off the stay sutures that were present.  No further suturing was necessary.  The skin incision was closed subumbilically with 4-0 Monocryl in a subcuticular stitch.  Using Dermabond all three incisions were closed with Dermabond.  Adequate hemostasis was present.  The patient was taken out of the dorsal lithotomy position.  She was awakened.  She was moved from the operating table to a stretcher in stable condition.  COMPLICATIONS:                None.  ESTIMATED BLOOD LOSS:         100 cc. Dictated by:   Sherry A. Rosalio Macadamia, M.D. Attending Physician:  Morene Antu DD:  01/25/01 TD:  01/25/01 Job:  [id number]

## 2010-06-05 NOTE — Assessment & Plan Note (Signed)
[facility]                                 ON-CALL NOTE   HADASA, GASNER                 MRN:          [id number]  DATE:04/02/2006                            DOB:          03-Oct-1955    PRIMARY CARE PHYSICIAN:  Dr. Jonny Ruiz.   Patient called in today, stating that she had attempted to get a  prescription refill for Ambien CR.  She did call the office and was told  that the Ambien CR was filled on 03/23/2006.  She had attempted to pick  up a prescription at [facility] last night, and they advised her that  they no longer use electronic prescription and that they will need a  written or faxed prescription from her primary care physician.   PLAN:  I was able to obtain her information from [facility] and  confirmed that her Ambien CR 12.5 mg one p.o. q.h.s. p.r.n. was filled  on 0305/2008 #30 with three refills at the [facility] [address].  I did  advise patient that as far as I was aware [facility] continues to [facility] from [facility] and that I would recommend  that she [facility] [address] advise them that the prescription  has been filled electronically since 03/23/2006; if there is still  issues she should call the office back during the week to discuss the  above.     Leanne Chang, M.D.  Electronically Signed    LA/MedQ  DD: 04/02/2006  DT: 04/03/2006  Job #: [id number]

## 2010-06-05 NOTE — H&P (Signed)
Gabrielle Benson, Gabrielle Benson NO.:  [id number]   MEDICAL RECORD NO.:  [id number]          PATIENT TYPE:  INP   LOCATION:  0106                         FACILITY:  [facility]   PHYSICIAN:  Lorain Childes, M.D. LHCDATE OF BIRTH:  1955-03-25   DATE OF ADMISSION:  06/09/2005  DATE OF DISCHARGE:                                HISTORY & PHYSICAL   PRIMARY CARE PHYSICIAN:  Corwin Levins, M.D. [facility]   CHIEF COMPLAINT:  Chest pain.   HISTORY OF PRESENT ILLNESS:  The patient is a 55 year old female with  diffuse pain syndrome, who has had increased stress recently and reports  chest pain beginning at 4:30 p.m.  The patient reports the pain is constant  and cramping radiating to her left arm.  She also complains of some mild  shortness of breath and lightheadedness.  This persisted throughout her  evaluation and now chest pain is improved after she received some Ativan.  She reports that she continues to have her chronic back pain and  fibromyalgia pains, but this is unchanged from her baseline.  She had a  similar episode of chest pain in the setting of increased stress back in  1999.  Cardiac enzymes were checked at the time and were negative.  She had  a stress test done that was also negative.  The patient reports that her  work is very stressful and typically she does not get mad, but today she  became quite upset and then had this chest pain episode.  In the emergency  room, she has received Ativan initially which has improved her discomfort.  Her blood pressure was also noted to be elevated at 180/100.  She received  Clonidine 0.2 mg p.o.  Her blood pressure now has improved.   PAST MEDICAL HISTORY:  1.  Chest pain evaluated in 1999 here.  EKG was normal.  She had negative      stress test and negative cardiac enzymes.  2.  Fibromyalgia.  She has been on multiple narcotics which do not      significantly help her symptoms.  She stopped taking all of those.  She      also has  been on Neurontin which did not help her pain.  She is being      managed currently with Flexeril and Ambien at bedtime to assist her with      sleep.  3.  Degenerative disk disease mostly effecting her neck causing chronic      headaches.  4.  History of fibroids.  5.  Status post laparoscopically-assisted vaginal hysterectomy done in      January of 2003.  6.  Status post right knee surgery.   SOCIAL HISTORY:  She lives in [place] with her husband.  She has a  daughter and a son who are healthy.  She works as a Print production planner.  She  reports significant stress with this job.  She denies any tobacco.  She  drinks maybe one glass of wine on the weekends.  She denies any drugs or  herbal medication use.  She follows a regular diet.  For exercise, she  belongs to [employer] which she has not been going for the past couple of weeks  because she has been busy.   MEDICATIONS:  1.  Ambien 10 mg p.o. q.h.s.  2.  Flexeril 10 mg p.o. q.h.s.   ALLERGIES:  PENICILLIN, AMPICILLIN.   FAMILY HISTORY:  Mother is alive at the age of 38, but on her mother's side  she reports a history of multiple cancers.  Father died at the age 79 from  an MI.  She had an uncle who also died at the age of 76 from an MI.  She has  no siblings.   REVIEW OF SYSTEMS:  She denies any fevers or chills.  No sweats.  She has  been losing weight, but this is intentional by watching her diet and  exercising.  She has chronic headaches related to her neck pain, but these  have not changed recently.  She denies any visual changes, no focal  weakness.  She has numbness which is intermittent and unchanged.  She  reports chest pain as stated in the HPI.  No shortness of breath, no dyspnea  on exertion, no orthopnea, no PND, no lower extremity edema, no  palpitations, no syncopal events, no coughing or wheezing.  She denies any  urinary symptoms, no hematuria, no dysuria.  NEUROPSYCH:  No focal weakness.  She does report some  numbness and tingling which is chronic and unchanged.  She denies any nausea or vomiting.  No diarrhea, no bright red blood per  rectum, no melena, no GERD symptoms.  All other systems are negative.   PHYSICAL EXAMINATION:  VITAL SIGNS:  Temperature 97.1, pulse 82,  respirations 18, blood pressure currently 144/73, she is saturating 100% on  room air.  GENERAL:  She is an anxious female, comfortable, lying in bed in no acute  distress.  HEENT:  Normocephalic and atraumatic.  Oropharynx is clear.  NECK:  JVP is approximately 6-7 cm.  She has no bruits.  She has no  lymphadenopathy.  CARDIOVASCULAR:  Normal S1, split S2. She has no S3 and no S4. She is  regular rate and rhythm.  She has no murmurs appreciated.  Her PMI is  nondisplaced.  Her pulses are equal throughout and 2+.  LUNGS:  Clear to auscultation bilaterally.  ABDOMEN:  Soft and nontender, positive bowel sounds throughout.  No  organomegaly.  EXTREMITIES:  She has no edema.  She has 2+ distal pulses.  NEUROLOGY:  She is alert and oriented x3.  Cranial nerves II-XII grossly  intact.  Strength is 5/5 upper and lower extremities bilaterally.   Chest x-ray shows no acute disease.   EKG shows rate of 78, sinus rhythm, normal axis, normal intervals, no  ischemic changes.  This is unchanged from EKG dated 1999, September 23.   LABORATORY DATA:  White count 5.9, hematocrit 37.4, platelet count 375.  Potassium 3.4, creatinine 0.7, glucose 109.  CK-MB 1.3, troponin less than  0.05, myoglobin 49.5.   ASSESSMENT:  The patient is a 55 year old female with family history of  early coronary artery disease, who presents with chest pain beginning after  a stressful event.   Problem 1.  Chest pain.  She has both typical and atypical features and is  improved after Ativan.  I suspect this is more stressful anxiety related,  however, she does have a significant family history.  I will assign her to observation status and follow on  telemetry.  We will cycle her cardiac  enzymes  to rule her out.  I will give her aspirin and nitroglycerin p.r.n.  We will discuss a stress test with cardiology evaluation as an outpatient or  inpatient.   Problem 2.  Chronic pain.  We will continue her home medications of Flexeril  and Ambien at bedtime.   Problem 3.  Hypertension.  Her blood pressure is elevated on arrival.  This  is likely related to her stress and the pain.  Her blood pressure has now  improved.  I have written her for some nitro paste and we will monitor it  closely and start an agent if needed.           ______________________________  Lorain Childes, M.D. [facility]     CGF/MEDQ  D:  06/10/2005  T:  06/10/2005  Job:  [id number]

## 2010-06-12 ENCOUNTER — Encounter: Payer: Self-pay | Admitting: Internal Medicine

## 2010-06-18 ENCOUNTER — Telehealth: Payer: Self-pay | Admitting: Gastroenterology

## 2010-06-18 NOTE — Telephone Encounter (Signed)
lmom for pt to call back. Last OV 03/16/2007; last COLON 04/24/2007 that was normal.

## 2010-06-18 NOTE — Telephone Encounter (Signed)
Pt called for same problem in 2010 per [employer]. Pt uses enemas, Dulcolax and Suppositories for constipation. Today, pt used an enema and had some rectal bleeding and every time she has wiped today, there has been blood on the tissue. Pt given an appt with Willette Cluster, NP on 06/23/10 at 3pm.

## 2010-06-23 ENCOUNTER — Telehealth: Payer: Self-pay | Admitting: Gastroenterology

## 2010-06-23 ENCOUNTER — Ambulatory Visit: Payer: Self-pay | Admitting: Nurse Practitioner

## 2011-03-11 NOTE — Telephone Encounter (Signed)
COMPLETED

## 2011-03-13 ENCOUNTER — Encounter: Payer: Self-pay | Admitting: Internal Medicine

## 2011-03-13 DIAGNOSIS — Z0001 Encounter for general adult medical examination with abnormal findings: Secondary | ICD-10-CM | POA: Insufficient documentation

## 2011-03-13 DIAGNOSIS — Z0000 Encounter for general adult medical examination without abnormal findings: Secondary | ICD-10-CM | POA: Insufficient documentation

## 2011-03-18 ENCOUNTER — Ambulatory Visit (INDEPENDENT_AMBULATORY_CARE_PROVIDER_SITE_OTHER): Payer: 59 | Admitting: Internal Medicine

## 2011-03-18 ENCOUNTER — Encounter: Payer: Self-pay | Admitting: Internal Medicine

## 2011-03-18 VITALS — BP 120/80 | HR 76 | Temp 98.2°F | Ht 63.0 in | Wt 170.2 lb

## 2011-03-18 DIAGNOSIS — J309 Allergic rhinitis, unspecified: Secondary | ICD-10-CM

## 2011-03-18 DIAGNOSIS — Z0000 Encounter for general adult medical examination without abnormal findings: Secondary | ICD-10-CM

## 2011-03-18 DIAGNOSIS — E559 Vitamin D deficiency, unspecified: Secondary | ICD-10-CM | POA: Insufficient documentation

## 2011-03-18 MED ORDER — FEXOFENADINE HCL 180 MG PO TABS: 180.0000 mg | ORAL_TABLET | Freq: Every day | ORAL | Status: DC

## 2011-03-18 MED ORDER — ALBUTEROL SULFATE HFA 108 (90 BASE) MCG/ACT IN AERS: 2.0000 | INHALATION_SPRAY | Freq: Four times a day (QID) | RESPIRATORY_TRACT | Status: DC | PRN

## 2011-03-18 MED ORDER — FLUTICASONE PROPIONATE 50 MCG/ACT NA SUSP: 2.0000 | Freq: Every day | NASAL | Status: DC

## 2011-03-18 MED ORDER — METHYLPREDNISOLONE ACETATE PF 80 MG/ML IJ SUSP
120.0000 mg | Freq: Once | INTRAMUSCULAR | Status: AC
  Administered 2011-03-18: 120 mg via INTRAMUSCULAR

## 2011-03-18 NOTE — Progress Notes (Signed)
Subjective:    Patient ID: Gabrielle Benson, female    DOB: 11/14/1955, 56 y.o.   MRN: [id number]  HPI  Here for wellness and f/u;  Overall doing ok;  Pt denies CP, worsening SOB, DOE, wheezing, orthopnea, PND, worsening LE edema, palpitations, dizziness or syncope.  Pt denies neurological change such as new Headache, facial or extremity weakness.  Pt denies polydipsia, polyuria, or low sugar symptoms. Pt states overall good compliance with treatment and medications, good tolerability, and trying to follow lower cholesterol diet.  Pt denies worsening depressive symptoms, suicidal ideation or panic. No fever, wt loss, night sweats, loss of appetite, or other constitutional symptoms.  Pt states good ability with ADL's, low fall risk, home safety reviewed and adequate, no significant changes in hearing or vision, and occasionally active with exercise.  Does have several wks ongoing nasal allergy symptoms with clear congestion, itch and sneeze, without fever, pain, ST, or wheezing, but with cough worse at night to lie down , no fever. Did see GYn recent for routine care Past Medical History  Diagnosis Date  . Fibromyalgia   . DJD (degenerative joint disease)   . Cervicalgia     Chronic  . DDD (degenerative disc disease)     Cervical  . Allergic rhinitis   . Anemia     Iron deficiency  . Hyperlipidemia   . ANA positive   . Multinodular thyroid   . Neuropathy, peripheral   . Vitamin d deficiency 03/18/2011   Past Surgical History  Procedure Date  . Abdominal hysterectomy     reports that she has never smoked. She does not have any smokeless tobacco history on file. She reports that she drinks alcohol. Her drug history not on file. family history includes Colon cancer in an unspecified family member; Diabetes in her mother; Heart disease in her father; Lung cancer in an unspecified family member; and Sickle cell trait in an unspecified family member. Allergies  Allergen Reactions  .  Azithromycin   . Duloxetine     REACTION: dizzy, nervous  . Penicillins   . Zolpidem Tartrate     REACTION: memory problem   No current outpatient prescriptions on file prior to visit.    Review of Systems Review of Systems  Constitutional: Negative for diaphoresis, activity change, appetite change and unexpected weight change.  HENT: Negative for hearing loss, ear pain, facial swelling, mouth sores and neck stiffness.   Eyes: Negative for pain, redness and visual disturbance.  Respiratory: Negative for shortness of breath and wheezing.   Cardiovascular: Negative for chest pain and palpitations.  Gastrointestinal: Negative for diarrhea, blood in stool, abdominal distention and rectal pain.  Genitourinary: Negative for hematuria, flank pain and decreased urine volume.  Musculoskeletal: Negative for myalgias and joint swelling.  Skin: Negative for color change and wound.  Neurological: Negative for syncope and numbness.  Hematological: Negative for adenopathy.  Psychiatric/Behavioral: Negative for hallucinations, self-injury, decreased concentration and agitation.      Objective:   Physical Exam BP 120/80  Pulse 76  Temp(Src) 98.2 F (36.8 C) (Oral)  Ht 5\' 3"  (1.6 m)  Wt 170 lb 4 oz (77.225 kg)  BMI 30.16 kg/m2  SpO2 97% Physical Exam  VS noted Constitutional: Pt is oriented to person, place, and time. Appears well-developed and well-nourished.  HENT:  Head: Normocephalic and atraumatic.  Right Ear: External ear normal.  Left Ear: External ear normal.  Nose: Nose normal.  Mouth/Throat: Oropharynx is clear and moist.  Bilat tm's mild  erythema.  Sinus nontender.  Pharynx mild erythema Eyes: Conjunctivae and EOM are normal. Pupils are equal, round, and reactive to light.  Neck: Normal range of motion. Neck supple. No JVD present. No tracheal deviation present.  Cardiovascular: Normal rate, regular rhythm, normal heart sounds and intact distal pulses.   Pulmonary/Chest:  Effort normal and breath sounds normal.  Abdominal: Soft. Bowel sounds are normal. There is no tenderness.  Musculoskeletal: Normal range of motion. Exhibits no edema.  Lymphadenopathy:  Has no cervical adenopathy.  Neurological: Pt is alert and oriented to person, place, and time. Pt has normal reflexes. No cranial nerve deficit.  Skin: Skin is warm and dry. No rash noted.  Psychiatric:  Has  normal mood and affect. Behavior is normal.     Assessment & Plan:

## 2011-03-18 NOTE — Assessment & Plan Note (Signed)

## 2011-03-18 NOTE — Patient Instructions (Addendum)
Please remember to followup with your GYN for the yearly pap smear and/or mammogram Please go to LAB in the Basement for the blood and/or urine tests to be done at your convenience Please call the phone number [phone] (the PhoneTree System) for results of testing in 2-3 days;  When calling, simply dial the number, and when prompted enter the MRN number above (the Medical Record Number) and the # key, then the message should start. You had the steroid shot today Take all new medications as prescribed Continue all other medications as before Please return in 1 year for your yearly visit, or sooner if needed, with Lab testing done 3-5 days before

## 2011-03-21 ENCOUNTER — Encounter: Payer: Self-pay | Admitting: Internal Medicine

## 2011-03-21 NOTE — Assessment & Plan Note (Signed)
Mild to mod, for allegra/flonase asd,  to f/u any worsening symptoms or concerns  

## 2011-04-01 ENCOUNTER — Telehealth: Payer: Self-pay | Admitting: *Deleted

## 2011-04-01 MED ORDER — HYDROCODONE-HOMATROPINE 5-1.5 MG/5ML PO SYRP: 5.0000 mL | ORAL_SOLUTION | Freq: Four times a day (QID) | ORAL | Status: AC | PRN

## 2011-04-01 NOTE — Telephone Encounter (Signed)
Called the patient left message prescription sent to pharmacy.

## 2011-04-01 NOTE — Telephone Encounter (Signed)
Done hardcopy to robin  

## 2011-04-01 NOTE — Telephone Encounter (Signed)
Left msg on vm saw md on 03/18/11. Was giving injection for her cough & post nasal drip. Pt states she is still having the symptoms and not able to perform. Wanting to get rx for cough syrup call into her pharmacy... 04/01/11@3 :33pm/LMB

## 2011-05-07 ENCOUNTER — Telehealth: Payer: Self-pay

## 2011-05-07 MED ORDER — FEXOFENADINE-PSEUDOEPHED ER 60-120 MG PO TB12: 1.0000 | ORAL_TABLET | Freq: Two times a day (BID) | ORAL | Status: AC

## 2011-05-07 NOTE — Telephone Encounter (Signed)
Pt called stating that Allegra is not helping allergy sxs. Pt is requesting new Rx for Allegra D, okay to change?

## 2011-05-07 NOTE — Telephone Encounter (Signed)
Pt advised of Rx/pharmacy 

## 2011-05-07 NOTE — Telephone Encounter (Signed)
Done per emr 

## 2011-06-18 ENCOUNTER — Encounter: Payer: Self-pay | Admitting: Internal Medicine

## 2011-07-01 ENCOUNTER — Encounter: Payer: Self-pay | Admitting: Internal Medicine

## 2011-07-01 ENCOUNTER — Ambulatory Visit (INDEPENDENT_AMBULATORY_CARE_PROVIDER_SITE_OTHER): Payer: 59 | Admitting: Internal Medicine

## 2011-07-01 ENCOUNTER — Other Ambulatory Visit (INDEPENDENT_AMBULATORY_CARE_PROVIDER_SITE_OTHER): Payer: 59

## 2011-07-01 VITALS — BP 112/62 | HR 76 | Temp 98.0°F | Ht 63.0 in | Wt 167.8 lb

## 2011-07-01 DIAGNOSIS — J309 Allergic rhinitis, unspecified: Secondary | ICD-10-CM

## 2011-07-01 DIAGNOSIS — Z23 Encounter for immunization: Secondary | ICD-10-CM

## 2011-07-01 DIAGNOSIS — Z0000 Encounter for general adult medical examination without abnormal findings: Secondary | ICD-10-CM

## 2011-07-01 DIAGNOSIS — F419 Anxiety disorder, unspecified: Secondary | ICD-10-CM | POA: Insufficient documentation

## 2011-07-01 DIAGNOSIS — E785 Hyperlipidemia, unspecified: Secondary | ICD-10-CM

## 2011-07-01 DIAGNOSIS — F411 Generalized anxiety disorder: Secondary | ICD-10-CM

## 2011-07-01 LAB — TSH: TSH: 0.46 u[IU]/mL (ref 0.35–5.50)

## 2011-07-01 LAB — CBC WITH DIFFERENTIAL/PLATELET
Eosinophils Relative: 0.6 % (ref 0.0–5.0)
HCT: 36.3 % (ref 36.0–46.0)
Lymphocytes Relative: 21.9 % (ref 12.0–46.0)
Lymphs Abs: 1.2 10*3/uL (ref 0.7–4.0)
Monocytes Relative: 7.6 % (ref 3.0–12.0)
Platelets: 345 10*3/uL (ref 150.0–400.0)
WBC: 5.4 10*3/uL (ref 4.5–10.5)

## 2011-07-01 LAB — URINALYSIS, ROUTINE W REFLEX MICROSCOPIC
Bilirubin Urine: NEGATIVE
Hgb urine dipstick: NEGATIVE
Total Protein, Urine: NEGATIVE
Urine Glucose: NEGATIVE

## 2011-07-01 LAB — BASIC METABOLIC PANEL
BUN: 13 mg/dL (ref 6–23)
CO2: 30 mEq/L (ref 19–32)
Calcium: 9.9 mg/dL (ref 8.4–10.5)
Creatinine, Ser: 0.7 mg/dL (ref 0.4–1.2)
GFR: 120.02 mL/min (ref >60.00)
Glucose, Bld: 76 mg/dL (ref 70–99)
Sodium: 144 mEq/L (ref 135–145)

## 2011-07-01 LAB — HEPATIC FUNCTION PANEL
ALT: 15 U/L (ref 0–35)
AST: 20 U/L (ref 0–37)
Alkaline Phosphatase: 71 U/L (ref 39–117)
Bilirubin, Direct: 0.1 mg/dL (ref 0.0–0.3)
Total Bilirubin: 0.5 mg/dL (ref 0.3–1.2)

## 2011-07-01 LAB — LDL CHOLESTEROL, DIRECT: Direct LDL: 122.7 mg/dL

## 2011-07-01 MED ORDER — FLUTICASONE PROPIONATE 50 MCG/ACT NA SUSP: 2.0000 | Freq: Every day | NASAL | Status: DC

## 2011-07-01 NOTE — Progress Notes (Signed)
Subjective:    Patient ID: Gabrielle Benson, female    DOB: 1955/11/22, 56 y.o.   MRN: [id number]  HPI  Here to f/u;  Does have several wks ongoing nasal allergy symptoms with clear congestion, itch and sneeze, without fever, pain, ST, cough or wheezing.  Has appt with Dr Walhalla Callas for eval in July.  Has significant post nasal gtt with cough, and fleeting sharp chest pains and back pains.  Out of flonase, asks for refill.  Pt denies other chest pain, increased sob or doe, wheezing, orthopnea, PND, increased LE swelling, palpitations, dizziness or syncope.  Pt denies new neurological symptoms such as new headache, or facial or extremity weakness or numbness   Pt denies polydipsia, polyuria  Pt states overall good compliance with meds, trying to follow lower cholesterol diet, wt overall stable.  Denies worsening depressive symptoms, suicidal ideation, or panic, though has ongoing anxiety, not increased recently.  Cont's to work at [employer] for [employer].  Asks to get labs done she could not last visit Past Medical History  Diagnosis Date  . Fibromyalgia   . DJD (degenerative joint disease)   . Cervicalgia     Chronic  . DDD (degenerative disc disease)     Cervical  . Allergic rhinitis   . Anemia     Iron deficiency  . Hyperlipidemia   . ANA positive   . Multinodular thyroid   . Neuropathy, peripheral   . Vitamin d deficiency 03/18/2011   Past Surgical History  Procedure Date  . Abdominal hysterectomy     reports that she has never smoked. She does not have any smokeless tobacco history on file. She reports that she drinks alcohol. Her drug history not on file. family history includes Colon cancer in an unspecified family member; Diabetes in her mother; Heart disease in her father; Lung cancer in an unspecified family member; and Sickle cell trait in an unspecified family member. Allergies  Allergen Reactions  . Azithromycin   . Duloxetine     REACTION: dizzy, nervous  . Penicillins   .  Zolpidem Tartrate     REACTION: memory problem   \ Current Outpatient Prescriptions on File Prior to Visit  Medication Sig Dispense Refill  . albuterol (PROVENTIL HFA;VENTOLIN HFA) 108 (90 BASE) MCG/ACT inhaler Inhale 2 puffs into the lungs every 6 (six) hours as needed for wheezing.  1 Inhaler  1  . fexofenadine-pseudoephedrine (ALLEGRA-D 12 HOUR) 60-120 MG per tablet Take 1 tablet by mouth 2 (two) times daily.  60 tablet  1  . Milnacipran HCl (SAVELLA) 25 MG TABS Take by mouth 3 (three) times daily.      . pregabalin (LYRICA) 50 MG capsule Take 50 mg by mouth 3 (three) times daily.      Marland Kitchen DISCONTD: fluticasone (FLONASE) 50 MCG/ACT nasal spray Place 2 sprays into the nose daily.  16 g  5   Review of Systems Review of Systems  Constitutional: Negative for diaphoresis and unexpected weight change.  HENT: Negative for drooling and tinnitus.   Eyes: Negative for photophobia and visual disturbance.  Respiratory: Negative for choking and stridor.   Gastrointestinal: Negative for vomiting and blood in stool.  Genitourinary: Negative for hematuria and decreased urine volume.  Musculoskeletal: Negative for gait problem.  Psychiatric/Behavioral: Negative for decreased concentration. The patient is not hyperactive.      Objective:   Physical Exam BP 112/62  Pulse 76  Temp 98 F (36.7 C) (Oral)  Ht 5\' 3"  (1.6 m)  Wt  167 lb 12 oz (76.091 kg)  BMI 29.72 kg/m2  SpO2 96% Physical Exam  VS noted Constitutional: Pt appears well-developed and well-nourished.  HENT: Head: Normocephalic.  Right Ear: External ear normal.  Left Ear: External ear normal.  Bilat tm's mild erythema.  Sinus nontender.  Pharynx mild erythema Eyes: Conjunctivae and EOM are normal. Pupils are equal, round, and reactive to light.  Neck: Normal range of motion. Neck supple.  Cardiovascular: Normal rate and regular rhythm.   Pulmonary/Chest: Effort normal and breath sounds normal.  Neurological: Pt is alert. Not  confused Skin: Skin is warm. No erythema.  Psychiatric: Pt behavior is normal. Thought content normal. 1+ nervous    Assessment & Plan:

## 2011-07-01 NOTE — Patient Instructions (Addendum)
You had the tetanus shot today Your EKG was ok today Please go to LAB in the Basement for the blood and/or urine tests to be done today You will be contacted by phone if any changes need to be made immediately.  Otherwise, you will receive a letter about your results with an explanation. Continue all other medications as before Your flonase refill was done as reqeusted Please have the pharmacy call with any other refills you may need. Please continue your efforts at being more active, low cholesterol diet, and weight control. Please return in 1 year for your yearly visit, or sooner if needed, with Lab testing done 3-5 days before

## 2011-07-01 NOTE — Assessment & Plan Note (Signed)
Mild to mod, declines further tx,  to f/u any worsening symptoms or concerns

## 2011-07-01 NOTE — Assessment & Plan Note (Signed)
stable overall by hx and exam,  and pt to continue medical treatment as before, goal ldl < 100, for f/u lab next visit

## 2011-07-01 NOTE — Assessment & Plan Note (Signed)
For flonase re-start, mild to mod, has f/u planned with allergy next month

## 2011-07-01 NOTE — Assessment & Plan Note (Signed)

## 2011-07-12 ENCOUNTER — Ambulatory Visit: Payer: 59 | Admitting: Internal Medicine

## 2012-04-19 ENCOUNTER — Other Ambulatory Visit: Payer: Self-pay | Admitting: Pain Medicine

## 2012-04-19 DIAGNOSIS — M542 Cervicalgia: Secondary | ICD-10-CM

## 2012-04-29 ENCOUNTER — Ambulatory Visit
Admission: RE | Admit: 2012-04-29 | Discharge: 2012-04-29 | Disposition: A | Discharge status: Home / Self Care | Payer: 59 | Source: Ambulatory Visit | Attending: Pain Medicine | Admitting: Pain Medicine

## 2012-04-29 DIAGNOSIS — M542 Cervicalgia: Secondary | ICD-10-CM

## 2012-05-12 ENCOUNTER — Other Ambulatory Visit: Payer: Self-pay | Admitting: Pain Medicine

## 2012-05-12 ENCOUNTER — Ambulatory Visit
Admission: RE | Admit: 2012-05-12 | Discharge: 2012-05-12 | Disposition: A | Discharge status: Home / Self Care | Payer: 59 | Source: Ambulatory Visit | Attending: Pain Medicine | Admitting: Pain Medicine

## 2012-05-12 ENCOUNTER — Ambulatory Visit: Payer: 59 | Admitting: Internal Medicine

## 2012-05-12 DIAGNOSIS — M79642 Pain in left hand: Secondary | ICD-10-CM

## 2012-05-12 DIAGNOSIS — M25532 Pain in left wrist: Secondary | ICD-10-CM

## 2012-06-17 LAB — HM MAMMOGRAPHY

## 2012-06-27 ENCOUNTER — Encounter: Payer: 59 | Admitting: Internal Medicine

## 2012-11-02 ENCOUNTER — Ambulatory Visit (INDEPENDENT_AMBULATORY_CARE_PROVIDER_SITE_OTHER): Payer: 59 | Admitting: Internal Medicine

## 2012-11-02 ENCOUNTER — Encounter: Payer: Self-pay | Admitting: Internal Medicine

## 2012-11-02 VITALS — BP 132/90 | HR 70 | Temp 98.2°F | Ht 63.0 in | Wt 173.0 lb

## 2012-11-02 DIAGNOSIS — K219 Gastro-esophageal reflux disease without esophagitis: Secondary | ICD-10-CM

## 2012-11-02 MED ORDER — PANTOPRAZOLE SODIUM 40 MG PO TBEC: 40.0000 mg | DELAYED_RELEASE_TABLET | Freq: Every day | ORAL | Status: DC

## 2012-11-02 NOTE — Progress Notes (Signed)
Subjective:    Patient ID: Gabrielle Benson, female    DOB: [birth year], 57 y.o.   MRN: [id number]  HPI  Here with 2-3 mo symptoms of ? difficulty swallowing with burning with hot solids or liquids, small pills get stuck just past the throat,  Chokes on liquids unless chin tuck for several yrs.  Denies worsening abd pain, dysphagia, n/v, bowel change or blood.  Not tried antacid.  Also Not really taking the nucynta.  S/p right knee cortisone per Dr Thomasena Edis yesterday, better now.  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 Fibromyalgia   . DJD (degenerative joint disease)   . Cervicalgia     Chronic  . DDD (degenerative disc disease)     Cervical  . Allergic rhinitis   . Anemia     Iron deficiency  . Hyperlipidemia   . ANA positive   . Multinodular thyroid   . Neuropathy, peripheral   . Vitamin D deficiency 03/18/2011   Past Surgical History  Procedure Laterality Date  . Abdominal hysterectomy      reports that she has never smoked. She does not have any smokeless tobacco history on file. She reports that she drinks alcohol. Her drug history is not on file. family history includes Colon cancer in an other family member; Diabetes in her mother; Heart disease in her father; Lung cancer in an other family member; Sickle cell trait in an other family member. Allergies  Allergen Reactions  . Azithromycin   . Duloxetine     REACTION: dizzy, nervous  . Penicillins   . Zolpidem Tartrate     REACTION: memory problem  \ Current Outpatient Prescriptions on File Prior to Visit  Medication Sig Dispense Refill  . albuterol (PROVENTIL HFA;VENTOLIN HFA) 108 (90 BASE) MCG/ACT inhaler Inhale 2 puffs into the lungs every 6 (six) hours as needed for wheezing.  1 Inhaler  1  . fluticasone (FLONASE) 50 MCG/ACT nasal spray Place 2 sprays into the nose daily.  48 g  3   No current  facility-administered medications on file prior to visit.   Review of Systems  Constitutional: Negative for unexpected weight change, or unusual diaphoresis  HENT: Negative for tinnitus.   Eyes: Negative for photophobia and visual disturbance.  Respiratory: Negative for choking and stridor.   Gastrointestinal: Negative for vomiting and blood in stool.  Genitourinary: Negative for hematuria and decreased urine volume.  Musculoskeletal: Negative for acute joint swelling Skin: Negative for color change and wound.  Neurological: Negative for tremors and numbness other than noted  Psychiatric/Behavioral: Negative for decreased concentration or  hyperactivity.       Objective:   Physical Exam BP 132/90  Pulse 70  Temp(Src) 98.2 F (36.8 C) (Oral)  Ht 5\' 3"  (1.6 m)  Wt 173 lb (78.472 kg)  BMI 30.65 kg/m2  SpO2 98% VS noted,  Constitutional: Pt appears well-developed and well-nourished.  HENT: Head: NCAT.  Right Ear: External ear normal.  Left Ear: External ear normal.  Eyes: Conjunctivae and EOM are normal. Pupils are equal, round, and reactive to light.  Neck: Normal range of motion. Neck supple.  Cardiovascular: Normal rate and regular rhythm.   Pulmonary/Chest: Effort normal and breath sounds normal.  Abd:  Soft, NT, non-distended, + BS Neurological: Pt is alert. Not confused  Skin: Skin is warm. No erythema.  Psychiatric: Pt behavior is normal. Thought content normal.  Assessment & Plan:

## 2012-11-02 NOTE — Patient Instructions (Signed)
Please take all new medication as prescribed  - the protonix Please call for GI referral if not improved in 1-2 wks Please continue all other medications as before, and refills have been done if requested. Please have the pharmacy call with any other refills you may need. Please keep your appointments with your specialists as you have planned  Please remember to sign up for My Chart if you have not done so, as this will be important to you in the future with finding out test results, communicating by private email, and scheduling acute appointments online when needed.

## 2012-11-02 NOTE — Assessment & Plan Note (Signed)
Mild to mod, for PPI,,  to f/u any worsening symptoms or concerns such as dysphagia, wt loss, n/v or blood

## 2012-12-01 ENCOUNTER — Ambulatory Visit (INDEPENDENT_AMBULATORY_CARE_PROVIDER_SITE_OTHER): Payer: 59 | Admitting: Gastroenterology

## 2012-12-01 ENCOUNTER — Encounter: Payer: Self-pay | Admitting: Gastroenterology

## 2012-12-01 VITALS — BP 110/80 | HR 80 | Ht 63.0 in | Wt 169.0 lb

## 2012-12-01 DIAGNOSIS — IMO0001 Reserved for inherently not codable concepts without codable children: Secondary | ICD-10-CM

## 2012-12-01 DIAGNOSIS — R109 Unspecified abdominal pain: Secondary | ICD-10-CM

## 2012-12-01 DIAGNOSIS — R1310 Dysphagia, unspecified: Secondary | ICD-10-CM

## 2012-12-01 DIAGNOSIS — K5900 Constipation, unspecified: Secondary | ICD-10-CM

## 2012-12-01 DIAGNOSIS — M797 Fibromyalgia: Secondary | ICD-10-CM

## 2012-12-01 DIAGNOSIS — K219 Gastro-esophageal reflux disease without esophagitis: Secondary | ICD-10-CM

## 2012-12-01 MED ORDER — SOD PICOSULFATE-MAG OX-CIT ACD 10-3.5-12 MG-GM-GM PO PACK: PACK | ORAL | Status: DC

## 2012-12-01 NOTE — Progress Notes (Signed)
History of Present Illness:  This is a 57 year old African American female who has had chronic constipation since childhood. She had colonoscopy that was unremarkable in April 2009. She currently goes long as a week without a bowel movement and develops pain in her pelvic areas radiating to her back bilaterally with some distention, and bloating but no severe abdominal pain, no nausea and vomiting. She denies melena hematochezia, has not been anemic. She also describes acid reflux with burning substernal chest pain and some intermittent dysphagia. She's been on PPI therapy for the last several weeks with mild improvement. Patient tries to follow a high fiber diet and allegedly drinks plenty of by mouth fluids. She has a history of fibromyalgia and peripheral neuropathy and is on multiple medications listed and reviewed in her record, but she is not on codeine preparations or other analgesics. She denies fever, chills, skin rashes, visual problems, but she has had some recurrent oral stomatitis. Other problems include scoliosis, nonspecific thyroid dysfunction, and a history of fibromyalgia. She does not abuse alcohol, cigarettes, or NSAIDs. There is been no anorexia, weight loss, or specific food intolerances noted.  I have reviewed this patient's present history, medical and surgical past history, allergies and medications.     ROS:   All systems were reviewed and are negative unless otherwise stated in the HPI.    Physical Exam: Blood pressure 110/80, pulse 80 and regular, and weight 169 with a BMI of 29.94. General well developed well nourished patient in no acute distress, appearing their stated age Eyes PERRLA, no icterus, fundoscopic exam per opthamologist Skin no lesions noted Neck supple, no adenopathy, no thyroid enlargement, no tenderness... thyroid is palpable but is nonnodular nontender. Chest clear to percussion and auscultation Heart no significant murmurs, gallops or rubs noted Abdomen  no hepatosplenomegaly masses or tenderness, BS normal.  Rectal inspection normal no fissures, or fistulae noted.  No masses or tenderness on digital exam. Stool guaiac negative. Squeeze pressure appears diminished. Extremities no acute joint lesions, edema, phlebitis or evidence of cellulitis. Neurologic patient oriented x 3, cranial nerves intact, no focal neurologic deficits noted. Psychological mental status normal and normal affect.  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 I've scheduled her for colonoscopy followup with a" double prep", and also at the time of her colonoscopy we'll do endoscopy because of her reflux and dysphasia. She will be a good candidate for probable Amitiza therapy. If she fails this we will do Sitz marker studies to exclude colonic atony. I've asked continue as tolerated high fiber diet with liberal by mouth fluids and to continue her laxatives as needed. Also asked her continue her Protonix and her antireflux regime pending endoscopic review.

## 2012-12-01 NOTE — Addendum Note (Signed)
Addended by: Ok Anis A on: 12/01/2012 09:40 AM   Modules accepted: Orders

## 2012-12-01 NOTE — Patient Instructions (Addendum)

## 2012-12-04 ENCOUNTER — Ambulatory Visit (AMBULATORY_SURGERY_CENTER): Payer: 59 | Admitting: Gastroenterology

## 2012-12-04 ENCOUNTER — Telehealth: Payer: Self-pay | Admitting: *Deleted

## 2012-12-04 ENCOUNTER — Encounter: Payer: Self-pay | Admitting: Gastroenterology

## 2012-12-04 VITALS — BP 131/91 | HR 75 | Temp 96.6°F | Resp 33 | Ht 63.0 in | Wt 169.0 lb

## 2012-12-04 DIAGNOSIS — K219 Gastro-esophageal reflux disease without esophagitis: Secondary | ICD-10-CM

## 2012-12-04 DIAGNOSIS — Z1211 Encounter for screening for malignant neoplasm of colon: Secondary | ICD-10-CM

## 2012-12-04 DIAGNOSIS — R1310 Dysphagia, unspecified: Secondary | ICD-10-CM

## 2012-12-04 DIAGNOSIS — K5900 Constipation, unspecified: Secondary | ICD-10-CM

## 2012-12-04 MED ORDER — SODIUM CHLORIDE 0.9 % IV SOLN: 500.0000 mL | INTRAVENOUS | Status: DC

## 2012-12-04 NOTE — Progress Notes (Signed)
Called to room to assist during endoscopic procedure.  Patient ID and intended procedure confirmed with present staff. Received instructions for my participation in the procedure from the performing physician.  

## 2012-12-04 NOTE — Patient Instructions (Addendum)
Samples of Amitiza given to you today,to take  Twice daily and other medicine for constipation .  Sitz marker study, COME BACK TOMORROW TO 3RD FLOOR FOR SITZ MARKER STUDY (ANYTIME)  BIOPSIES WERE DONE   INFORMATION ON ESOPHAGITIS & REFLUX (GERD) GIVEN TO YOU TODAY   YOU HAD AN ENDOSCOPIC PROCEDURE TODAY AT THE [facility]: Refer to the procedure report that was given to you for any specific questions about what was found during the examination.  If the procedure report does not answer your questions, please call your gastroenterologist to clarify.  If you requested that your care partner not be given the details of your procedure findings, then the procedure report has been included in a sealed envelope for you to review at your convenience later.  YOU SHOULD EXPECT: Some feelings of bloating in the abdomen. Passage of more gas than usual.  Walking can help get rid of the air that was put into your GI tract during the procedure and reduce the bloating. If you had a lower endoscopy (such as a colonoscopy or flexible sigmoidoscopy) you may notice spotting of blood in your stool or on the toilet paper. If you underwent a bowel prep for your procedure, then you may not have a normal bowel movement for a few days.  DIET: Your first meal following the procedure should be a light meal and then it is ok to progress to your normal diet.  A half-sandwich or bowl of soup is an example of a good first meal.  Heavy or fried foods are harder to digest and may make you feel nauseous or bloated.  Likewise meals heavy in dairy and vegetables can cause extra gas to form and this can also increase the bloating.  Drink plenty of fluids but you should avoid alcoholic beverages for 24 hours.  ACTIVITY: Your care partner should take you home directly after the procedure.  You should plan to take it easy, moving slowly for the rest of the day.  You can resume normal activity the day after the procedure  however you should NOT DRIVE or use heavy machinery for 24 hours (because of the sedation medicines used during the test).    SYMPTOMS TO REPORT IMMEDIATELY: A gastroenterologist can be reached at any hour.  During normal business hours, 8:30 AM to 5:00 PM Monday through Friday, call [phone].  After hours and on weekends, please call the GI answering service at [phone] who will take a message and have the physician on call contact you.   Following lower endoscopy (colonoscopy or flexible sigmoidoscopy):  Excessive amounts of blood in the stool  Significant tenderness or worsening of abdominal pains  Swelling of the abdomen that is new, acute  Fever of 100F or higher  Following upper endoscopy (EGD)  Vomiting of blood or coffee ground material  New chest pain or pain under the shoulder blades  Painful or persistently difficult swallowing  New shortness of breath  Fever of 100F or higher  Black, tarry-looking stools  FOLLOW UP: If any biopsies were taken you will be contacted by phone or by letter within the next 1-3 weeks.  Call your gastroenterologist if you have not heard about the biopsies in 3 weeks.  Our staff will call the home number listed on your records the next business day following your procedure to check on you and address any questions or concerns that you may have at that time regarding the information given to you following  your procedure. This is a courtesy call and so if there is no answer at the home number and we have not heard from you through the emergency physician on call, we will assume that you have returned to your regular daily activities without incident.  SIGNATURES/CONFIDENTIALITY: You and/or your care partner have signed paperwork which will be entered into your electronic medical record.  These signatures attest to the fact that that the information above on your After Visit Summary has been reviewed and is understood.  Full responsibility of  the confidentiality of this discharge information lies with you and/or your care-partner.

## 2012-12-04 NOTE — Telephone Encounter (Signed)
Zella Ball, RN upstairs will have patient come in tomorrow for sitz markers

## 2012-12-04 NOTE — Progress Notes (Signed)
Patient did not experience any of the following events: a burn prior to discharge; a fall within the facility; wrong site/side/patient/procedure/implant event; or a hospital transfer or hospital admission upon discharge from the facility. (G8907) Patient did not have preoperative order for IV antibiotic SSI prophylaxis. (G8918)  

## 2012-12-04 NOTE — Telephone Encounter (Signed)
Patient is still up stairs after procedure, patient will come back tomorrow for sitz markers per Dr. Jarold Motto and Aram Beecham, RN

## 2012-12-04 NOTE — Op Note (Signed)
[facility] [address] [place], [address]   ENDOSCOPY PROCEDURE REPORT  PATIENT: Gabrielle Benson, Gabrielle Benson  MR#: [id number] BIRTHDATE: 17-Sep-1955 , 57  yrs. old GENDER: Female ENDOSCOPIST: Mardella Layman, MD, [facility] REFERRED BY:  Oliver Barre, M.D. PROCEDURE DATE:  12/04/2012 PROCEDURE:  EGD w/ biopsy and EGD w/ biopsy for H.pylori ASA CLASS:     Class II INDICATIONS:  Chest pain.   Epigastric pain.   Dysphagia. MEDICATIONS: There was residual sedation effect present from prior procedure and Propofol (Diprivan) 280 mg IV TOPICAL ANESTHETIC:  DESCRIPTION OF PROCEDURE: After the risks benefits and alternatives of the procedure were thoroughly explained, informed consent was obtained.  The LB [id number] endoscope was introduced through the mouth and advanced to the second portion of the duodenum. Without limitations.  The instrument was slowly withdrawn as the mucosa was fully examined.        DUODENUM: The duodenal mucosa showed no abnormalities in the bulb and second portion of the duodenum.  Cold forceps biopsies were taken in the bulb and second portion.  STOMACH: The mucosa of the stomach appeared normal.  A CLO biopsy was performed.  ESOPHAGUS: There was LA Class B esophagitis noted.See pictures Retroflexed views revealed no abnormalities.     The scope was then withdrawn from the patient and the procedure completed.No definite stricture was noted in the esophagus, the patient was dilated easily with a #52 [place] Lahey dilator.  There was no bleeding or chest pain after this procedure.  COMPLICATIONS: There were no complications. ENDOSCOPIC IMPRESSION: 1.   The duodenal mucosa showed no abnormalities in the bulb and second portion of the duodenum .Marland Kitchenbiopsies done to r/o celiac disease 2.   The mucosa of the stomach appeared normal; biopsy .Marland KitchenCLo Bx. done 3.   There was LA Class B esophagitis noted..chronic GERD  RECOMMENDATIONS: 1.  Await  biopsy results 2.  Continue current medications  REPEAT EXAM:  eSigned:  Mardella Layman, MD, [facility] 12/04/2012 2:46 PM   CC:  PATIENT NAME:  Gabrielle Benson, Gabrielle Benson MR#: [id number]

## 2012-12-04 NOTE — Progress Notes (Signed)
Procedure ends, to recovery, report given and VSS. 

## 2012-12-04 NOTE — Op Note (Signed)
 [facility] [address] [place], [address]   COLONOSCOPY PROCEDURE REPORT  PATIENT: Gabrielle, Benson  MR#: [id number] BIRTHDATE: 1955-10-04 , 57  yrs. old GENDER: Female ENDOSCOPIST: Mardella Layman, MD, [facility] REFERRED BY: PROCEDURE DATE:  12/04/2012 PROCEDURE:   Colonoscopy, screening Prior Negative Screening - Now for repeat screening.  N/A History of Adenoma - Now for follow-up colonoscopy & has been > or = to 3 yrs.  N/A ASA CLASS:   Class II INDICATIONS:average risk screening, Constipation, and Change in bowel habits. MEDICATIONS: propofol (Diprivan) 300mg  IV  DESCRIPTION OF PROCEDURE:   After the risks benefits and alternatives of the procedure were thoroughly explained, informed consent was obtained.  A digital rectal exam revealed no abnormalities of the rectum.   The LB [id number]  endoscope was introduced through the anus and advanced to the cecum, which was identified by both the appendix and ileocecal valve. No adverse events experienced.   Limited by poor preparation.   The quality of the prep was poor, using MoviPrep  The instrument was then slowly withdrawn as the colon was fully examined.      COLON FINDINGS: A normal appearing cecum, ileocecal valve, and appendiceal orifice were identified.  The ascending, hepatic flexure, transverse, splenic flexure, descending, sigmoid colon and rectum appeared unremarkable.  No polyps or cancers were seen. noted was an extremely poor prep throughout the length of the colon despite a double prep for this procedure.  There were no mucosal polypoid lesions.  Over a liter of liquid feculent material was aspirated. Retroflexed views revealed no abnormalities. The time to cecum=5 minutes 03 seconds.  Withdrawal time=5 minutes 12 seconds. The scope was withdrawn and the procedure completed. COMPLICATIONS: There were no complications.  ENDOSCOPIC IMPRESSION: Normal colon...very poor prep in a  patient who has no anatomical clonic obstruction or other known abnormalities.  She has chronic functional constipation and laxative dependency, perhaps colonic atony.  RECOMMENDATIONS: 1.  Continue current medications 2.   Sitz markers study 3. trial of Amitiza 24 mcg bid and continue other medicine for constipation as prescribed 4. endoscopy scheduled for today  eSigned:  Mardella Layman, MD, [facility] 12/04/2012 2:38 PM   cc:   PATIENT NAME:  Gabrielle, Benson MR#: [id number]

## 2012-12-05 ENCOUNTER — Telehealth: Payer: Self-pay

## 2012-12-05 ENCOUNTER — Telehealth: Payer: Self-pay | Admitting: *Deleted

## 2012-12-05 DIAGNOSIS — K5900 Constipation, unspecified: Secondary | ICD-10-CM

## 2012-12-05 NOTE — Telephone Encounter (Signed)
No answer, left message

## 2012-12-05 NOTE — Telephone Encounter (Signed)
Patient will be here Thursday at 830 am to do Sitz markers I advised patient she will have to come next Tuesday--12-12-2012 to do an xray  I advised patient no stomach medications especially any medications that are laxatives or stool softeners. I advised patient we can discuss the do and do not's again on Thursday Patient verbalized understanding.

## 2012-12-06 ENCOUNTER — Encounter: Payer: Self-pay | Admitting: Gastroenterology

## 2012-12-06 LAB — HELICOBACTER PYLORI SCREEN-BIOPSY: UREASE: NEGATIVE

## 2012-12-08 ENCOUNTER — Encounter: Payer: Self-pay | Admitting: Gastroenterology

## 2012-12-11 ENCOUNTER — Telehealth: Payer: Self-pay | Admitting: Gastroenterology

## 2012-12-11 NOTE — Telephone Encounter (Signed)
Patient had Sitz mark test not capsule endo Left message for patient to call back

## 2012-12-11 NOTE — Telephone Encounter (Signed)
Patient will come tomorrow for xray for sitzmark late tomorrow pm

## 2012-12-12 ENCOUNTER — Ambulatory Visit (INDEPENDENT_AMBULATORY_CARE_PROVIDER_SITE_OTHER)
Admission: RE | Admit: 2012-12-12 | Discharge: 2012-12-12 | Disposition: A | Discharge status: Home / Self Care | Payer: 59 | Source: Ambulatory Visit | Attending: Gastroenterology | Admitting: Gastroenterology

## 2012-12-12 DIAGNOSIS — K5900 Constipation, unspecified: Secondary | ICD-10-CM

## 2012-12-13 ENCOUNTER — Encounter: Payer: 59 | Admitting: Internal Medicine

## 2012-12-19 ENCOUNTER — Telehealth: Payer: Self-pay | Admitting: *Deleted

## 2012-12-19 NOTE — Telephone Encounter (Signed)
Message copied by Florene Glen on Tue Dec 19, 2012  2:30 PM ------      Message from: Jarold Motto, DAVID R      Created: Wed Dec 13, 2012  8:16 AM       Refer to [facility] for colonic atony workup   Continue meds and prn Suprep use ------

## 2012-12-27 NOTE — Telephone Encounter (Signed)
lmom for pt to call back

## 2012-12-27 NOTE — Telephone Encounter (Signed)
Informed pt Dr Jarold Motto wants to refer her to [facility] for Colonic Atony; pt stated understanding. [facility] and pt is scheduled to see Dr Jacinto Reap on 01/04/13 at 2:45pm. Address [address], [place] [address]. Phone [phone], fax [phone] attn: Patrice. Pt stated understanding but may need to change the appt.

## 2013-02-15 ENCOUNTER — Encounter: Payer: 59 | Admitting: Internal Medicine

## 2013-02-20 ENCOUNTER — Telehealth: Payer: Self-pay

## 2013-02-20 DIAGNOSIS — Z0000 Encounter for general adult medical examination without abnormal findings: Secondary | ICD-10-CM

## 2013-02-20 NOTE — Telephone Encounter (Signed)
cpx labs entered  

## 2013-04-06 ENCOUNTER — Encounter: Payer: Self-pay | Admitting: Internal Medicine

## 2013-06-01 ENCOUNTER — Other Ambulatory Visit (INDEPENDENT_AMBULATORY_CARE_PROVIDER_SITE_OTHER): Payer: 59

## 2013-06-01 DIAGNOSIS — Z0000 Encounter for general adult medical examination without abnormal findings: Secondary | ICD-10-CM

## 2013-06-01 LAB — BASIC METABOLIC PANEL
BUN: 11 mg/dL (ref 6–23)
CHLORIDE: 101 meq/L (ref 96–112)
CO2: 28 mEq/L (ref 19–32)
Calcium: 10.4 mg/dL (ref 8.4–10.5)
Creatinine, Ser: 0.7 mg/dL (ref 0.4–1.2)
GFR: 113.21 mL/min (ref >60.00)
Glucose, Bld: 103 mg/dL — ABNORMAL HIGH (ref 70–99)
Potassium: 4 mEq/L (ref 3.5–5.1)
Sodium: 137 mEq/L (ref 135–145)

## 2013-06-01 LAB — HEPATIC FUNCTION PANEL
ALBUMIN: 4.1 g/dL (ref 3.5–5.2)
ALT: 18 U/L (ref 0–35)
AST: 25 U/L (ref 0–37)
Alkaline Phosphatase: 76 U/L (ref 39–117)
BILIRUBIN TOTAL: 0.5 mg/dL (ref 0.2–1.2)
Bilirubin, Direct: 0 mg/dL (ref 0.0–0.3)
Total Protein: 7.6 g/dL (ref 6.0–8.3)

## 2013-06-01 LAB — CBC WITH DIFFERENTIAL/PLATELET
Basophils Absolute: 0 10*3/uL (ref 0.0–0.1)
Basophils Relative: 0.7 % (ref 0.0–3.0)
Eosinophils Absolute: 0 10*3/uL (ref 0.0–0.7)
Eosinophils Relative: 0.8 % (ref 0.0–5.0)
HCT: 36.5 % (ref 36.0–46.0)
Hemoglobin: 11.8 g/dL — ABNORMAL LOW (ref 12.0–15.0)
Lymphocytes Relative: 34 % (ref 12.0–46.0)
Lymphs Abs: 1.8 10*3/uL (ref 0.7–4.0)
MCHC: 32.4 g/dL (ref 30.0–36.0)
MCV: 84.1 fl (ref 78.0–100.0)
MONO ABS: 0.4 10*3/uL (ref 0.1–1.0)
Monocytes Relative: 7.5 % (ref 3.0–12.0)
NEUTROS PCT: 57 % (ref 43.0–77.0)
Neutro Abs: 3 10*3/uL (ref 1.4–7.7)
PLATELETS: 323 10*3/uL (ref 150.0–400.0)
RBC: 4.33 Mil/uL (ref 3.87–5.11)
RDW: 13.5 % (ref 11.5–15.5)
WBC: 5.3 10*3/uL (ref 4.0–10.5)

## 2013-06-01 LAB — URINALYSIS, ROUTINE W REFLEX MICROSCOPIC
BILIRUBIN URINE: NEGATIVE
Hgb urine dipstick: NEGATIVE
KETONES UR: NEGATIVE
LEUKOCYTES UA: NEGATIVE
NITRITE: NEGATIVE
Specific Gravity, Urine: 1.015 (ref 1.000–1.030)
Total Protein, Urine: NEGATIVE
UROBILINOGEN UA: 0.2 (ref 0.0–1.0)
Urine Glucose: NEGATIVE
pH: 7.5 (ref 5.0–8.0)

## 2013-06-01 LAB — LIPID PANEL
CHOL/HDL RATIO: 3
Cholesterol: 234 mg/dL — ABNORMAL HIGH (ref 0–200)
HDL: 90.4 mg/dL (ref >39.00)
LDL CALC: 127 mg/dL — AB (ref 0–99)
Triglycerides: 84 mg/dL (ref 0.0–149.0)
VLDL: 16.8 mg/dL (ref 0.0–40.0)

## 2013-06-01 LAB — TSH: TSH: 1.37 u[IU]/mL (ref 0.35–4.50)

## 2013-06-08 ENCOUNTER — Ambulatory Visit (INDEPENDENT_AMBULATORY_CARE_PROVIDER_SITE_OTHER): Payer: 59 | Admitting: Internal Medicine

## 2013-06-08 ENCOUNTER — Encounter: Payer: 59 | Admitting: Internal Medicine

## 2013-06-08 ENCOUNTER — Encounter: Payer: Self-pay | Admitting: Internal Medicine

## 2013-06-08 VITALS — BP 118/80 | HR 67 | Temp 98.1°F | Ht 63.5 in | Wt 180.0 lb

## 2013-06-08 DIAGNOSIS — Z0000 Encounter for general adult medical examination without abnormal findings: Secondary | ICD-10-CM

## 2013-06-08 MED ORDER — MOMETASONE FUROATE 50 MCG/ACT NA SUSP: 2.0000 | Freq: Every day | NASAL | Status: DC

## 2013-06-08 NOTE — Progress Notes (Signed)
Subjective:    Patient ID: Gabrielle Benson, female    DOB: [birth year], 58 y.o.   MRN: [id number]  HPI  Here for wellness and f/u;  Overall doing ok;  Pt denies CP, worsening SOB, DOE, wheezing, orthopnea, PND, worsening LE edema, palpitations, dizziness or syncope.  Pt denies neurological change such as new headache, facial or extremity weakness.  Pt denies polydipsia, polyuria, or low sugar symptoms. Pt states overall good compliance with treatment and medications, good tolerability, and has been trying to follow lower cholesterol diet.  Pt denies worsening depressive symptoms, suicidal ideation or panic. No fever, night sweats, wt loss, loss of appetite, or other constitutional symptoms.  Pt states good ability with ADL's, has low fall risk, home safety reviewed and adequate, no other significant changes in hearing or vision, and only occasionally active with exercise.  S/p recent right hand bone surgury/cts per Dr Apolonio Schneiders. No other complaints, Has appt next month at [facility] for constipation per pt.  [facility], has gained several lbs with recent surgury and less activity. Past Medical History  Diagnosis Date  . Fibromyalgia   . DJD (degenerative joint disease)   . Cervicalgia     Chronic  . DDD (degenerative disc disease)     Cervical  . Allergic rhinitis   . Anemia     Iron deficiency  . Hyperlipidemia   . ANA positive   . Multinodular thyroid   . Neuropathy, peripheral   . Vitamin D deficiency 03/18/2011   Past Surgical History  Procedure Laterality Date  . Abdominal hysterectomy      reports that she has never smoked. She has never used smokeless tobacco. She reports that she drinks alcohol. She reports that she does not use illicit drugs. family history includes Colon cancer in an other family member; Diabetes in her mother; Heart disease in her father; Lung cancer in an other family member; Sickle cell trait in an other family member.  There is no history of Stomach cancer, Rectal cancer, or Esophageal cancer. Allergies  Allergen Reactions  . Azithromycin   . Duloxetine     REACTION: dizzy, nervous  . Penicillins   . Zolpidem Tartrate     REACTION: memory problem   Current Outpatient Prescriptions on File Prior to Visit  Medication Sig Dispense Refill  . cyclobenzaprine (FLEXERIL) 10 MG tablet Take 10 mg by mouth at bedtime.      . diclofenac sodium (VOLTAREN) 1 % GEL Apply topically.      Marland Kitchen ESTRACE VAGINAL 0.1 MG/GM vaginal cream       . methocarbamol (ROBAXIN) 500 MG tablet Take 500 mg by mouth 2 (two) times daily.      . mometasone (NASONEX) 50 MCG/ACT nasal spray Place 2 sprays into the nose daily.      . pantoprazole (PROTONIX) 40 MG tablet Take 1 tablet (40 mg total) by mouth daily.  90 tablet  3  . pregabalin (LYRICA) 150 MG capsule Take 150 mg by mouth 3 (three) times daily.      . Vitamin D, Ergocalciferol, (DRISDOL) 50000 UNITS CAPS capsule Take 50,000 Units by mouth.      . fluticasone (FLONASE) 50 MCG/ACT nasal spray Place 2 sprays into the nose daily.  48 g  3   No current facility-administered medications on file prior to visit.   Review of Systems Constitutional: Negative for increased diaphoresis, other activity, appetite or other siginficant weight change  HENT: Negative for worsening hearing loss, ear pain, facial swelling, mouth sores and neck stiffness.   Eyes: Negative for other worsening pain, redness or visual disturbance.  Respiratory: Negative for shortness of breath and wheezing.   Cardiovascular: Negative for chest pain and palpitations.  Gastrointestinal: Negative for diarrhea, blood in stool, abdominal distention or other pain Genitourinary: Negative for hematuria, flank pain or change in urine volume.  Musculoskeletal: Negative for myalgias or other joint complaints.  Skin: Negative for color change and wound.  Neurological: Negative for syncope and numbness. other than  noted Hematological: Negative for adenopathy. or other swelling Psychiatric/Behavioral: Negative for hallucinations, self-injury, decreased concentration or other worsening agitation.      Objective:   Physical Exam BP 118/80  Pulse 67  Temp(Src) 98.1 F (36.7 C) (Oral)  Ht 5' 3.5" (1.613 m)  Wt 180 lb (81.647 kg)  BMI 31.38 kg/m2  SpO2 96% VS noted,  Constitutional: Pt is oriented to person, place, and time. Appears well-developed and well-nourished.  Head: Normocephalic and atraumatic.  Right Ear: External ear normal.  Left Ear: External ear normal.  Nose: Nose normal.  Mouth/Throat: Oropharynx is clear and moist.  Eyes: Conjunctivae and EOM are normal. Pupils are equal, round, and reactive to light.  Neck: Normal range of motion. Neck supple. No JVD present. No tracheal deviation present.  Cardiovascular: Normal rate, regular rhythm, normal heart sounds and intact distal pulses.   Pulmonary/Chest: Effort normal and breath sounds without rales or wheezing  Abdominal: Soft. Bowel sounds are normal. NT. No HSM  Musculoskeletal: Normal range of motion. Exhibits no edema.  Lymphadenopathy:  Has no cervical adenopathy.  Neurological: Pt is alert and oriented to person, place, and time. Pt has normal reflexes. No cranial nerve deficit. Motor grossly intact Skin: Skin is warm and dry. No rash noted.  Psychiatric:  Has normal mood and affect. Behavior is normal.     Assessment & Plan:

## 2013-06-08 NOTE — Progress Notes (Signed)
Pre visit review using our clinic review tool, if applicable. No additional management support is needed unless otherwise documented below in the visit note. 

## 2013-06-08 NOTE — Patient Instructions (Signed)
Please continue all other medications as before, and refills have been done if requested. Please have the pharmacy call with any other refills you may need.  Please continue your efforts at being more active, low cholesterol diet, and weight control.  You are otherwise up to date with prevention measures today.  Your Lab work was [facility] , as well as your EKG today  Please keep your appointments with your specialists as you have planned  Please return in 1 year for your yearly visit, or sooner if needed, with Lab testing done 3-5 days before

## 2013-06-08 NOTE — Assessment & Plan Note (Signed)

## 2013-09-20 ENCOUNTER — Encounter: Payer: Self-pay | Admitting: Gastroenterology

## 2013-10-24 ENCOUNTER — Ambulatory Visit (INDEPENDENT_AMBULATORY_CARE_PROVIDER_SITE_OTHER): Payer: 59 | Admitting: Internal Medicine

## 2013-10-24 ENCOUNTER — Encounter: Payer: Self-pay | Admitting: Internal Medicine

## 2013-10-24 ENCOUNTER — Other Ambulatory Visit: Payer: Self-pay

## 2013-10-24 VITALS — BP 112/84 | HR 67 | Temp 98.1°F | Resp 12 | Wt 175.1 lb

## 2013-10-24 DIAGNOSIS — J069 Acute upper respiratory infection, unspecified: Secondary | ICD-10-CM

## 2013-10-24 DIAGNOSIS — B009 Herpesviral infection, unspecified: Secondary | ICD-10-CM

## 2013-10-24 MED ORDER — VALACYCLOVIR HCL 1 G PO TABS: ORAL_TABLET | ORAL | Status: DC

## 2013-10-24 MED ORDER — SULFAMETHOXAZOLE-TMP DS 800-160 MG PO TABS: 1.0000 | ORAL_TABLET | Freq: Two times a day (BID) | ORAL | Status: DC

## 2013-10-24 NOTE — Telephone Encounter (Signed)
A user error has taken place: encounter opened in error, closed for administrative reasons.

## 2013-10-24 NOTE — Progress Notes (Signed)
   Subjective:    Patient ID: Gabrielle Benson, female    DOB: 01-05-1956, 58 y.o.   MRN: [id number]  HPI   Her symptoms began 10/19/13 as chills and sweats associated with nasal stuffiness. She's had some green and yellow nasal discharge as well as frontal sinus pain.  She describes discomfort in the right greater than left ear.  She has some itchy, watery eyes.  She's also had night sweats.  As of 10/5 she began to have "cold sores" over the upper lip      Review of Systems   She denies sore throat, otic discharge, cough, sputum production, shortness of breath, or wheezing.       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 She also has a lesion over the left upper lip. There is minimal erythema of the oropharynx without exudates. Thyroid prominent w/o nodules.  General appearance:good health ;well nourished; no acute distress or increased work of breathing is present.  No  lymphadenopathy about the head, neck, or axilla noted.  Eyes: No conjunctival inflammation or lid edema is present. There is no scleral icterus. Ears:  External ear exam shows no significant lesions or deformities.  Otoscopic examination reveals clear canals, tympanic membranes are intact bilaterally without bulging, retraction, inflammation or discharge Nose:  External nasal examination shows no deformity or inflammation. Nasal mucosa are dy & erythematous without lesions or exudates. No septal dislocation or deviation.No obstruction to airflow.  Oral exam: Dental hygiene is good; lips and gums are healthy appearing other than the herpetic lesions. Neck:  No deformities, masses, or tenderness noted.   Supple with full range of motion without pain.  Heart:  Normal rate and regular rhythm. S1 and S2 normal without gallop, murmur, click, rub or other extra sounds.  Lungs:Chest clear to auscultation; no  wheezes, rhonchi,rales ,or rubs present.No increased work of breathing.   Extremities:  No cyanosis, edema, or clubbing  noted  Skin: Warm & dry w/o  tenting.         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  #2 nasal purulence; R/O rhinosinusitis  Plan: See orders and recommendations

## 2013-10-24 NOTE — Progress Notes (Signed)
Pre visit review using our clinic review tool, if applicable. No additional management support is needed unless otherwise documented below in the visit note. 

## 2013-10-24 NOTE — Patient Instructions (Addendum)

## 2013-10-24 NOTE — Progress Notes (Signed)
   Subjective:    Patient ID: Gabrielle Benson, female    DOB: 1955/11/08, 58 y.o.   MRN: [id number]  HPI  Symptoms started 10/19/13 with nasal congestion and purulence, chills, and sweats.  She also is having bilateral ear pain R>L, some extrinsic symptoms of itchy, watery eyes.  She has an occasional non productive cough.   She also has several cold sores that have developed.    She denies sore throat, ear discharge.  She has taken Nyquil, Theraflu, and Benadryl at home with moderate symptom relief.   She has not been taking Nasacort for several months.   Review of Systems     Objective:   Physical Exam        Assessment & Plan:

## 2013-10-25 ENCOUNTER — Telehealth: Payer: Self-pay

## 2013-10-25 MED ORDER — FLUTICASONE PROPIONATE 50 MCG/ACT NA SUSP: 2.0000 | Freq: Every day | NASAL | Status: DC

## 2013-10-25 NOTE — Telephone Encounter (Signed)
Fax from pharmacy requesting a refill on fluticasone prop 50 mcg spray not on current list.

## 2013-10-25 NOTE — Telephone Encounter (Signed)
Ok change from nasonex to flonase - done erx

## 2013-11-02 ENCOUNTER — Ambulatory Visit (INDEPENDENT_AMBULATORY_CARE_PROVIDER_SITE_OTHER): Payer: 59 | Admitting: Internal Medicine

## 2013-11-02 ENCOUNTER — Encounter: Payer: Self-pay | Admitting: Internal Medicine

## 2013-11-02 VITALS — BP 140/78 | HR 67 | Temp 98.3°F | Wt 171.0 lb

## 2013-11-02 DIAGNOSIS — R05 Cough: Secondary | ICD-10-CM

## 2013-11-02 DIAGNOSIS — E785 Hyperlipidemia, unspecified: Secondary | ICD-10-CM

## 2013-11-02 DIAGNOSIS — R059 Cough, unspecified: Secondary | ICD-10-CM | POA: Insufficient documentation

## 2013-11-02 DIAGNOSIS — B001 Herpesviral vesicular dermatitis: Secondary | ICD-10-CM | POA: Insufficient documentation

## 2013-11-02 MED ORDER — HYDROCOD POLST-CHLORPHEN POLST 10-8 MG/5ML PO LQCR: 5.0000 mL | Freq: Two times a day (BID) | ORAL | Status: DC | PRN

## 2013-11-02 NOTE — Assessment & Plan Note (Signed)
D/w pt   - stable overall by history and exam, recent data reviewed with pt, and pt to continue medical treatment as before,  to f/u any worsening symptoms or concerns Lab Results  Component Value Date   LDLCALC 127* 06/01/2013   For cont'd low chol diet, f/u lab next visit

## 2013-11-02 NOTE — Progress Notes (Signed)
Pre visit review using our clinic review tool, if applicable. No additional management support is needed unless otherwise documented below in the visit note. 

## 2013-11-02 NOTE — Assessment & Plan Note (Signed)
Improved, near resolved, no further antibx needed, tylenol prn discomfort,  to f/u any worsening symptoms or concerns

## 2013-11-02 NOTE — Patient Instructions (Signed)
Please take all new medication as prescribed- the cough medicine  OK to finish the antibiotic as you are doing  Your cold sore are improved  Please continue all other medications as before  Please have the pharmacy call with any other refills you may need.  Please keep your appointments with your specialists as you may have planned

## 2013-11-02 NOTE — Assessment & Plan Note (Signed)
With recent URI, overall improved but with persistent cough making quality of life difficult at home and work, for tussionex prn, finish current antibx,  to f/u any worsening symptoms or concerns

## 2013-11-02 NOTE — Progress Notes (Signed)
Subjective:    Patient ID: Gabrielle Benson, female    DOB: 1955-02-17, 58 y.o.   MRN: [id number]  HPI  Here to f/u, overall improved upper mid lip herpetic rash but still slight scabbed and sensitive, no futher new lesions, worsening pain or drainang, tolerated the acyclovir well.  Overall still signficant cough howevere, though no worsening fever, cough now some more productive though small, but greenish, no ST, CP, sob, wheezing but just cant sleep at night, and gets uncomfortable coughing at work.   Pt denies fever, wt loss, night sweats, loss of appetite, or other constitutional symptoms Past Medical History  Diagnosis Date  . Fibromyalgia   . DJD (degenerative joint disease)   . Cervicalgia     Chronic  . DDD (degenerative disc disease)     Cervical  . Allergic rhinitis   . Anemia     Iron deficiency  . Hyperlipidemia   . ANA positive   . Multinodular thyroid   . Neuropathy, peripheral   . Vitamin D deficiency 03/18/2011   Past Surgical History  Procedure Laterality Date  . Abdominal hysterectomy      reports that she has never smoked. She has never used smokeless tobacco. She reports that she drinks alcohol. She reports that she does not use illicit drugs. family history includes Colon cancer in an other family member; Diabetes in her mother; Heart disease in her father; Lung cancer in an other family member; Sickle cell trait in an other family member. There is no history of Stomach cancer, Rectal cancer, or Esophageal cancer. Allergies  Allergen Reactions  . Penicillins     rash  . Duloxetine     REACTION: dizzy, nervous  . Zolpidem Tartrate     REACTION: memory problem   Current Outpatient Prescriptions on File Prior to Visit  Medication Sig Dispense Refill  . Cyclobenzaprine HCl (AMRIX PO) Take by mouth.      . diclofenac sodium (VOLTAREN) 1 % GEL Apply topically.      Marland Kitchen ESTRACE VAGINAL 0.1 MG/GM vaginal cream       . fluticasone (FLONASE) 50 MCG/ACT nasal  spray Place 2 sprays into both nostrils daily.  16 g  2  . methocarbamol (ROBAXIN) 500 MG tablet Take 500 mg by mouth 2 (two) times daily.      . pantoprazole (PROTONIX) 40 MG tablet Take 1 tablet (40 mg total) by mouth daily.  90 tablet  3  . pregabalin (LYRICA) 150 MG capsule Take 150 mg by mouth 3 (three) times daily.      Marland Kitchen sulfamethoxazole-trimethoprim (BACTRIM DS) 800-160 MG per tablet Take 1 tablet by mouth 2 (two) times daily.  14 tablet  0  . valACYclovir (VALTREX) 1000 MG tablet 2 pills bid  4 tablet  0  . Vitamin D, Ergocalciferol, (DRISDOL) 50000 UNITS CAPS capsule Take 50,000 Units by mouth.       No current facility-administered medications on file prior to visit.    Review of Systems  Constitutional: Negative for unusual diaphoresis or other sweats  HENT: Negative for ringing in ear Eyes: Negative for double vision or worsening visual disturbance.  Respiratory: Negative for choking and stridor.   Gastrointestinal: Negative for vomiting or other signifcant bowel change Genitourinary: Negative for hematuria or decreased urine volume.  Musculoskeletal: Negative for other MSK pain or swelling Skin: Negative for color change and worsening wound.  Neurological: Negative for tremors and numbness other than noted  Psychiatric/Behavioral: Negative for decreased concentration or agitation  other than above       Objective:   Physical Exam BP 140/78  Pulse 67  Temp(Src) 98.3 F (36.8 C)  Wt 171 lb (77.565 kg)  SpO2 98% VS noted,  Constitutional: Pt appears well-developed, well-nourished.  HENT: Head: NCAT.  Right Ear: External ear normal.  Left Ear: External ear normal.  Eyes: . Pupils are equal, round, and reactive to light. Conjunctivae and EOM are normal Neck: Normal range of motion. Neck supple.  Cardiovascular: Normal rate and regular rhythm.   Bilat tm's with mild erythema.  Max sinus areas  tender.  Pharynx with mild erythema, no exudate Pulmonary/Chest: Effort  normal and breath sounds normal.  - no rales or wheezing Neurological: Pt is alert. Not confused , motor grossly intact Skin: Skin is warm. Mid upper lip rash scabbed, mild tender, non vesicular Psychiatric: Pt behavior is normal. No agitation.     Assessment & Plan:

## 2014-01-13 ENCOUNTER — Encounter (HOSPITAL_COMMUNITY): Payer: Self-pay | Admitting: Emergency Medicine

## 2014-01-13 ENCOUNTER — Emergency Department (INDEPENDENT_AMBULATORY_CARE_PROVIDER_SITE_OTHER)
Admission: EM | Admit: 2014-01-13 | Discharge: 2014-01-13 | Disposition: A | Discharge status: Home / Self Care | Payer: 59 | Source: Home / Self Care | Attending: Family Medicine | Admitting: Family Medicine

## 2014-01-13 DIAGNOSIS — R05 Cough: Secondary | ICD-10-CM

## 2014-01-13 DIAGNOSIS — J9809 Other diseases of bronchus, not elsewhere classified: Secondary | ICD-10-CM

## 2014-01-13 DIAGNOSIS — R059 Cough, unspecified: Secondary | ICD-10-CM

## 2014-01-13 DIAGNOSIS — R0982 Postnasal drip: Secondary | ICD-10-CM

## 2014-01-13 MED ORDER — PREDNISONE 20 MG PO TABS: ORAL_TABLET | ORAL | Status: DC

## 2014-01-13 MED ORDER — ALBUTEROL SULFATE HFA 108 (90 BASE) MCG/ACT IN AERS: 2.0000 | INHALATION_SPRAY | RESPIRATORY_TRACT | Status: DC | PRN

## 2014-01-13 NOTE — ED Notes (Signed)
Patient reports cough and uri symptoms since October.  In October saw pcp and was treated with doxycycline.  Since then has seen a physician at her job site.  Describes last medicine prescribed as "levor..."  Patient thinks she is improving and concerned she is taking the last pill today and does not return to work until Tuesday and this will be the first she can contact that doctor.  Patient says her son here and thought she would get seen while he was being seen

## 2014-01-13 NOTE — ED Provider Notes (Signed)
CSN: [id number]     Arrival date & time 01/13/14  1341 History   First MD Initiated Contact with Patient 01/13/14 1419     Chief Complaint  Patient presents with  . Cough   (Consider location/radiation/quality/duration/timing/severity/associated sxs/prior Treatment) HPI Comments: 58 year old female complaining of a cough for 3 months. She states she is producing a large amount of phlegm. She is also having sneezing, PND. She was given test and neck a couple months ago by her PCP which did help for the time being. She also had been given an antibiotic, doxycycline by the physician at her worksite. She continues to cough and spit up the PND. No fever.   Past Medical History  Diagnosis Date  . Fibromyalgia   . DJD (degenerative joint disease)   . Cervicalgia     Chronic  . DDD (degenerative disc disease)     Cervical  . Allergic rhinitis   . Anemia     Iron deficiency  . Hyperlipidemia   . ANA positive   . Multinodular thyroid   . Neuropathy, peripheral   . Vitamin D deficiency 03/18/2011   Past Surgical History  Procedure Laterality Date  . Abdominal hysterectomy     Family History  Problem Relation Age of Onset  . Heart disease Father   . Sickle cell trait    . Lung cancer    . Diabetes Mother   . Colon cancer      Aunt  . Stomach cancer Neg Hx   . Rectal cancer Neg Hx   . Esophageal cancer Neg Hx    History  Substance Use Topics  . Smoking status: Never Smoker   . Smokeless tobacco: Never Used  . Alcohol Use: 0.0 oz/week    3-4 Glasses of wine per week   OB History    No data available     Review of Systems  Constitutional: Negative for fever, activity change and fatigue.  HENT: Positive for postnasal drip. Negative for congestion and sore throat.   Respiratory: Positive for cough. Negative for wheezing.   Cardiovascular: Negative for chest pain.  Gastrointestinal: Negative.     Allergies  Penicillins; Duloxetine; and Zolpidem tartrate  Home  Medications   Prior to Admission medications   Medication Sig Start Date End Date Taking? Authorizing Provider  albuterol (PROVENTIL HFA;VENTOLIN HFA) 108 (90 BASE) MCG/ACT inhaler Inhale 2 puffs into the lungs every 4 (four) hours as needed for wheezing or shortness of breath. 01/13/14   Janne Napoleon, NP  chlorpheniramine-HYDROcodone [employer] PENNKINETIC ER) 10-8 MG/5ML LQCR Take 5 mLs by mouth every 12 (twelve) hours as needed for cough. 11/02/13   [name], MD  Cyclobenzaprine HCl (AMRIX PO) Take by mouth.    Historical Provider, MD  diclofenac sodium (VOLTAREN) 1 % GEL Apply topically.    Historical Provider, MD  ESTRACE VAGINAL 0.1 MG/GM vaginal cream  09/12/12   Historical Provider, MD  fluticasone (FLONASE) 50 MCG/ACT nasal spray Place 2 sprays into both nostrils daily. 10/25/13   [name], MD  methocarbamol (ROBAXIN) 500 MG tablet Take 500 mg by mouth 2 (two) times daily.    Historical Provider, MD  pantoprazole (PROTONIX) 40 MG tablet Take 1 tablet (40 mg total) by mouth daily. 11/02/12   [name], MD  predniSONE (DELTASONE) 20 MG tablet Take 3 tabs po on first day, 2 tabs second day, 2 tabs third day, 1 tab fourth day, 1 tab 5th day. Take with food. 01/13/14  Janne Napoleon, NP  pregabalin (LYRICA) 150 MG capsule Take 150 mg by mouth 3 (three) times daily.    Historical Provider, MD  sulfamethoxazole-trimethoprim (BACTRIM DS) 800-160 MG per tablet Take 1 tablet by mouth 2 (two) times daily. 10/24/13   Hendricks Limes, MD  valACYclovir (VALTREX) 1000 MG tablet 2 pills bid 10/24/13   Hendricks Limes, MD  Vitamin D, Ergocalciferol, (DRISDOL) 50000 UNITS CAPS capsule Take 50,000 Units by mouth.    Historical Provider, MD   BP 173/97 mmHg  Pulse 76  Temp(Src) 97.7 F (36.5 C) (Oral)  Resp 18  SpO2 97% Physical Exam  Constitutional: She is oriented to person, place, and time. She appears well-developed and well-nourished. No distress.  HENT:  Mouth/Throat: No oropharyngeal  exudate.  Bilateral TMs are normal. Oropharynx with copious amount of clear PND and cobblestoning.  Eyes: Conjunctivae and EOM are normal.  Neck: Normal range of motion. Neck supple.  Cardiovascular: Normal rate, regular rhythm and normal heart sounds.   Pulmonary/Chest: Effort normal and breath sounds normal. She has no rales.  All he with forced expiration and cough can the examiner here St. distant wheeze. Otherwise lungs clear.  Musculoskeletal: Normal range of motion. She exhibits no edema.  Lymphadenopathy:    She has no cervical adenopathy.  Neurological: She is alert and oriented to person, place, and time.  Skin: Skin is warm and dry.  Nursing note and vitals reviewed.   ED Course  Procedures (including critical care time) Labs Review Labs Reviewed - No data to display  Imaging Review No results found.   MDM   1. Cough   2. PND (post-nasal drip)   3. Recurrent bronchospasm    Cough, Adult Chlor-Trimeton 4 mg tablets, 1/2 to 1 tab every 4 hours as needed for drainage. Albuterol HFA as dir Prednisone taper Lots of fluids   Janne Napoleon, NP 01/13/14 1440

## 2014-01-13 NOTE — Discharge Instructions (Signed)
Cough, Adult Chlor-Trimeton 4 mg tablets, 1/2 to 1 tab every 4 hours as needed for drainage.  A cough is a reflex that helps clear your throat and airways. It can help heal the body or may be a reaction to an irritated airway. A cough may only last 2 or 3 weeks (acute) or may last more than 8 weeks (chronic).  CAUSES Acute cough:  Viral or bacterial infections. Chronic cough:  Infections.  Allergies.  Asthma.  Post-nasal drip.  Smoking.  Heartburn or acid reflux.  Some medicines.  Chronic lung problems (COPD).  Cancer. SYMPTOMS   Cough.  Fever.  Chest pain.  Increased breathing rate.  High-pitched whistling sound when breathing (wheezing).  Colored mucus that you cough up (sputum). TREATMENT   A bacterial cough may be treated with antibiotic medicine.  A viral cough must run its course and will not respond to antibiotics.  Your caregiver may recommend other treatments if you have a chronic cough. HOME CARE INSTRUCTIONS   Only take over-the-counter or prescription medicines for pain, discomfort, or fever as directed by your caregiver. Use cough suppressants only as directed by your caregiver.  Use a cold steam vaporizer or humidifier in your bedroom or home to help loosen secretions.  Sleep in a semi-upright position if your cough is worse at night.  Rest as needed.  Stop smoking if you smoke. SEEK IMMEDIATE MEDICAL CARE IF:   You have pus in your sputum.  Your cough starts to worsen.  You cannot control your cough with suppressants and are losing sleep.  You begin coughing up blood.  You have difficulty breathing.  You develop pain which is getting worse or is uncontrolled with medicine.  You have a fever. MAKE SURE YOU:   Understand these instructions.  Will watch your condition.  Will get help right away if you are not doing well or get worse. Document Released: 07/03/2010 Document Revised: 03/29/2011 Document Reviewed:  07/03/2010 [facility] Patient Information 2015 [place], [place]. This information is not intended to replace advice given to you by your health care provider. Make sure you discuss any questions you have with your health care provider.

## 2014-02-22 ENCOUNTER — Encounter: Payer: Self-pay | Admitting: Internal Medicine

## 2014-02-22 ENCOUNTER — Ambulatory Visit (INDEPENDENT_AMBULATORY_CARE_PROVIDER_SITE_OTHER): Payer: 59 | Admitting: Internal Medicine

## 2014-02-22 VITALS — BP 110/78 | HR 76 | Temp 98.1°F | Ht 64.0 in | Wt 179.0 lb

## 2014-02-22 DIAGNOSIS — J309 Allergic rhinitis, unspecified: Secondary | ICD-10-CM

## 2014-02-22 DIAGNOSIS — R079 Chest pain, unspecified: Secondary | ICD-10-CM | POA: Insufficient documentation

## 2014-02-22 DIAGNOSIS — R05 Cough: Secondary | ICD-10-CM

## 2014-02-22 DIAGNOSIS — R059 Cough, unspecified: Secondary | ICD-10-CM

## 2014-02-22 MED ORDER — AZITHROMYCIN 250 MG PO TABS: ORAL_TABLET | ORAL | Status: DC

## 2014-02-22 MED ORDER — HYDROCODONE-HOMATROPINE 5-1.5 MG/5ML PO SYRP: 5.0000 mL | ORAL_SOLUTION | Freq: Four times a day (QID) | ORAL | Status: DC | PRN

## 2014-02-22 NOTE — Progress Notes (Signed)
Pre visit review using our clinic review tool, if applicable. No additional management support is needed unless otherwise documented below in the visit note. 

## 2014-02-22 NOTE — Assessment & Plan Note (Signed)
Recurrrent, ? Acute on chronic bronchitis, no wheeze, but with worsening left chest pain, for zpack, cough med, consider cxr if not improved,  to f/u any worsening symptoms or concerns such as worsening cp or sob

## 2014-02-22 NOTE — Assessment & Plan Note (Signed)
?   Responsible for oither symptoms prior to this episode since oct 2015 without fever? No wheeze or sob at this time, cont nasonex, antihist and consider allergy referral if cont's

## 2014-02-22 NOTE — Assessment & Plan Note (Signed)
Fleeting sharp left without radiat or assoc symptoms, most c/w msk it seems, but consider cxr if persists or worsens

## 2014-02-22 NOTE — Progress Notes (Signed)
Subjective:    Patient ID: Gabrielle Benson, female    DOB: Dec 04, 1955, 59 y.o.   MRN: [id number]  HPI  Here with acute onset mild to mod 2-3 days ST, HA, general weakness and malaise, with copious prod cough greenish sputum, but Pt denies increased sob or doe, wheezing, orthopnea, PND, increased LE swelling, palpitations, dizziness or syncope. This on top of recurring other symptoms without cough that seems to keep recurring since oct 2015, despite her allergy tx with nasonex.  Also with 2 days fleeting sharp left CP with coughing only, non pleuritic, nonexertional , nonpositional, no raidation, no assoc diaphoresis, n/v, palp, dizziness Past Medical History  Diagnosis Date  . Fibromyalgia   . DJD (degenerative joint disease)   . Cervicalgia     Chronic  . DDD (degenerative disc disease)     Cervical  . Allergic rhinitis   . Anemia     Iron deficiency  . Hyperlipidemia   . ANA positive   . Multinodular thyroid   . Neuropathy, peripheral   . Vitamin D deficiency 03/18/2011   Past Surgical History  Procedure Laterality Date  . Abdominal hysterectomy      reports that she has never smoked. She has never used smokeless tobacco. She reports that she drinks alcohol. She reports that she does not use illicit drugs. family history includes Colon cancer in an other family member; Diabetes in her mother; Heart disease in her father; Lung cancer in an other family member; Sickle cell trait in an other family member. There is no history of Stomach cancer, Rectal cancer, or Esophageal cancer. Allergies  Allergen Reactions  . Penicillins     rash  . Duloxetine     REACTION: dizzy, nervous  . Zolpidem Tartrate     REACTION: memory problem   Current Outpatient Prescriptions on File Prior to Visit  Medication Sig Dispense Refill  . albuterol (PROVENTIL HFA;VENTOLIN HFA) 108 (90 BASE) MCG/ACT inhaler Inhale 2 puffs into the lungs every 4 (four) hours as needed for wheezing or shortness of  breath. 1 Inhaler 0  . chlorpheniramine-HYDROcodone (TUSSIONEX PENNKINETIC ER) 10-8 MG/5ML LQCR Take 5 mLs by mouth every 12 (twelve) hours as needed for cough. 115 mL 0  . Cyclobenzaprine HCl (AMRIX PO) Take by mouth.    . diclofenac sodium (VOLTAREN) 1 % GEL Apply topically.    Marland Kitchen ESTRACE VAGINAL 0.1 MG/GM vaginal cream     . fluticasone (FLONASE) 50 MCG/ACT nasal spray Place 2 sprays into both nostrils daily. 16 g 2  . methocarbamol (ROBAXIN) 500 MG tablet Take 500 mg by mouth 2 (two) times daily.    . pantoprazole (PROTONIX) 40 MG tablet Take 1 tablet (40 mg total) by mouth daily. 90 tablet 3  . pregabalin (LYRICA) 150 MG capsule Take 150 mg by mouth 3 (three) times daily.    . valACYclovir (VALTREX) 1000 MG tablet 2 pills bid 4 tablet 0  . Vitamin D, Ergocalciferol, (DRISDOL) 50000 UNITS CAPS capsule Take 50,000 Units by mouth.     No current facility-administered medications on file prior to visit.   Review of Systems  Constitutional: Negative for unusual diaphoresis or other sweats  HENT: Negative for ringing in ear Eyes: Negative for double vision or worsening visual disturbance.  Respiratory: Negative for choking and stridor.   Gastrointestinal: Negative for vomiting or other signifcant bowel change Genitourinary: Negative for hematuria or decreased urine volume.  Musculoskeletal: Negative for other MSK pain or swelling Skin: Negative for color change  and worsening wound.  Neurological: Negative for tremors and numbness other than noted  Psychiatric/Behavioral: Negative for decreased concentration or agitation other than above       Objective:   Physical Exam BP 110/78 mmHg  Pulse 76  Temp(Src) 98.1 F (36.7 C) (Oral)  Ht 5\' 4"  (1.626 m)  Wt 179 lb (81.194 kg)  BMI 30.71 kg/m2  SpO2 97% VS noted, mild ill Constitutional: Pt appears well-developed, well-nourished.  HENT: Head: NCAT.  Right Ear: External ear normal.  Left Ear: External ear normal.  Eyes: . Pupils are  equal, round, and reactive to light. Conjunctivae and EOM are normal Neck: Normal range of motion. Neck supple.  Bilat tm's with mild erythema.  Max sinus areas non tender.  Pharynx with mild erythema, no exudate Cardiovascular: Normal rate and regular rhythm.   Pulmonary/Chest: Effort normal and breath sounds somewhat decrease bilat without rales or wheezing.  Neurological: Pt is alert. Not confused , motor grossly intact Skin: Skin is warm. No rash Psychiatric: Pt behavior is normal. No agitation.      Assessment & Plan:

## 2014-02-22 NOTE — Patient Instructions (Signed)
Please take all new medication as prescribed - the antibiotic, and cough medicine  Please continue all other medications as before, and refills have been done if requested.  Please have the pharmacy call with any other refills you may need.  Please keep your appointments with your specialists as you may have planned      

## 2014-04-19 ENCOUNTER — Ambulatory Visit (INDEPENDENT_AMBULATORY_CARE_PROVIDER_SITE_OTHER)
Admission: RE | Admit: 2014-04-19 | Discharge: 2014-04-19 | Disposition: A | Discharge status: Home / Self Care | Payer: 59 | Source: Ambulatory Visit | Attending: Internal Medicine | Admitting: Internal Medicine

## 2014-04-19 ENCOUNTER — Ambulatory Visit (INDEPENDENT_AMBULATORY_CARE_PROVIDER_SITE_OTHER): Payer: 59 | Admitting: Internal Medicine

## 2014-04-19 ENCOUNTER — Encounter: Payer: Self-pay | Admitting: Internal Medicine

## 2014-04-19 VITALS — BP 120/84 | HR 77 | Temp 98.4°F | Resp 18 | Ht 64.0 in | Wt 175.1 lb

## 2014-04-19 DIAGNOSIS — R059 Cough, unspecified: Secondary | ICD-10-CM

## 2014-04-19 DIAGNOSIS — R05 Cough: Secondary | ICD-10-CM | POA: Diagnosis not present

## 2014-04-19 DIAGNOSIS — J309 Allergic rhinitis, unspecified: Secondary | ICD-10-CM

## 2014-04-19 MED ORDER — MONTELUKAST SODIUM 10 MG PO TABS: 10.0000 mg | ORAL_TABLET | Freq: Every day | ORAL | Status: DC

## 2014-04-19 MED ORDER — CETIRIZINE HCL 10 MG PO TABS: 10.0000 mg | ORAL_TABLET | Freq: Every day | ORAL | Status: DC

## 2014-04-19 MED ORDER — METHYLPREDNISOLONE ACETATE 80 MG/ML IJ SUSP
80.0000 mg | Freq: Once | INTRAMUSCULAR | Status: AC
  Administered 2014-04-19: 80 mg via INTRAMUSCULAR

## 2014-04-19 MED ORDER — PREDNISONE 10 MG PO TABS: ORAL_TABLET | ORAL | Status: DC

## 2014-04-19 NOTE — Patient Instructions (Signed)
You had the steroid shot today  Please take all new medication as prescribed  - the prednisone, zyrtec and singulair  Please go to the XRAY Department in the Basement (go straight as you get off the elevator) for the x-ray testing  You will be contacted by phone if any changes need to be made immediately.  Otherwise, you will receive a letter about your results with an explanation, but please check with MyChart first.  You will be contacted regarding the referral for: Allergy - Dr Donneta Romberg

## 2014-04-19 NOTE — Progress Notes (Signed)
Subjective:    Patient ID: Gabrielle Benson, female    DOB: 1955/08/03, 59 y.o.   MRN: [id number]  HPI  Does have 4-6 wks ongoing nasal allergy symptoms with clearish congestion and marked coughing so that co-workers are now tired of it, with itch and sneezing, without fever, pain, ST, swelling or wheezing. Asks for cxr, suggested by company MD as well.  Good compliacne with nasacort daily but not working as well now.  Has not see Dr Donneta Romberg Tommie Raymond recent Past Medical History  Diagnosis Date  . Fibromyalgia   . DJD (degenerative joint disease)   . Cervicalgia     Chronic  . DDD (degenerative disc disease)     Cervical  . Allergic rhinitis   . Anemia     Iron deficiency  . Hyperlipidemia   . ANA positive   . Multinodular thyroid   . Neuropathy, peripheral   . Vitamin D deficiency 03/18/2011   Past Surgical History  Procedure Laterality Date  . Abdominal hysterectomy      reports that she has never smoked. She has never used smokeless tobacco. She reports that she drinks alcohol. She reports that she does not use illicit drugs. family history includes Colon cancer in an other family member; Diabetes in her mother; Heart disease in her father; Lung cancer in an other family member; Sickle cell trait in an other family member. There is no history of Stomach cancer, Rectal cancer, or Esophageal cancer. Allergies  Allergen Reactions  . Penicillins     rash  . Duloxetine     REACTION: dizzy, nervous  . Zolpidem Tartrate     REACTION: memory problem   Current Outpatient Prescriptions on File Prior to Visit  Medication Sig Dispense Refill  . albuterol (PROVENTIL HFA;VENTOLIN HFA) 108 (90 BASE) MCG/ACT inhaler Inhale 2 puffs into the lungs every 4 (four) hours as needed for wheezing or shortness of breath. 1 Inhaler 0  . chlorpheniramine-HYDROcodone (TUSSIONEX PENNKINETIC ER) 10-8 MG/5ML LQCR Take 5 mLs by mouth every 12 (twelve) hours as needed for cough. 115 mL 0  .  Cyclobenzaprine HCl (AMRIX PO) Take by mouth.    . diclofenac sodium (VOLTAREN) 1 % GEL Apply topically.    Marland Kitchen ESTRACE VAGINAL 0.1 MG/GM vaginal cream     . methocarbamol (ROBAXIN) 500 MG tablet Take 500 mg by mouth 2 (two) times daily.    . pantoprazole (PROTONIX) 40 MG tablet Take 1 tablet (40 mg total) by mouth daily. 90 tablet 3  . pregabalin (LYRICA) 150 MG capsule Take 150 mg by mouth 3 (three) times daily.    . valACYclovir (VALTREX) 1000 MG tablet 2 pills bid 4 tablet 0  . Vitamin D, Ergocalciferol, (DRISDOL) 50000 UNITS CAPS capsule Take 50,000 Units by mouth.    Marland Kitchen HYDROcodone-homatropine (HYCODAN) 5-1.5 MG/5ML syrup Take 5 mLs by mouth every 6 (six) hours as needed for cough. (Patient not taking: Reported on 04/19/2014) 180 mL 0   No current facility-administered medications on file prior to visit.   Review of Systems  Constitutional: Negative for unusual diaphoresis or night sweats HENT: Negative for ringing in ear or discharge Eyes: Negative for double vision or worsening visual disturbance.  Respiratory: Negative for choking and stridor.   Gastrointestinal: Negative for vomiting or other signifcant bowel change Genitourinary: Negative for hematuria or change in urine volume.  Musculoskeletal: Negative for other MSK pain or swelling Skin: Negative for color change and worsening wound.  Neurological: Negative for tremors and numbness other  than noted  Psychiatric/Behavioral: Negative for decreased concentration or agitation other than above       Objective:   Physical Exam BP 120/84 mmHg  Pulse 77  Temp(Src) 98.4 F (36.9 C) (Oral)  Resp 18  Ht 5\' 4"  (1.626 m)  Wt 175 lb 1.9 oz (79.434 kg)  BMI 30.04 kg/m2  SpO2 98% VS noted,  Constitutional: Pt appears in no significant distress HENT: Head: NCAT.  Right Ear: External ear normal.  Left Ear: External ear normal.  Bilat tm's with mild erythema.  Max sinus areas non tender.  Pharynx with mild erythema, no exudate Eyes:  . Pupils are equal, round, and reactive to light. Conjunctivae and EOM are normal Neck: Normal range of motion. Neck supple.  Cardiovascular: Normal rate and regular rhythm.   Pulmonary/Chest: Effort normal and breath sounds without rales or wheezing.  Neurological: Pt is alert. Not confused , motor grossly intact Skin: Skin is warm. No rash, no LE edema Psychiatric: Pt behavior is normal. No agitation.     Assessment & Plan:

## 2014-04-19 NOTE — Assessment & Plan Note (Signed)
With marked seasonal flare , chronic recurrent, for depomedrol IM today, predpac asd, cont nasacort, add zyrtec and singulair, refer allergy as well

## 2014-04-19 NOTE — Progress Notes (Signed)
Pre visit review using our clinic review tool, if applicable. No additional management support is needed unless otherwise documented below in the visit note. 

## 2014-04-19 NOTE — Assessment & Plan Note (Signed)
Quite severe, for cxr as well

## 2014-05-10 ENCOUNTER — Encounter: Payer: Self-pay | Admitting: Internal Medicine

## 2014-05-31 ENCOUNTER — Other Ambulatory Visit: Payer: Self-pay | Admitting: Internal Medicine

## 2014-08-13 ENCOUNTER — Ambulatory Visit (INDEPENDENT_AMBULATORY_CARE_PROVIDER_SITE_OTHER): Payer: 59 | Admitting: Internal Medicine

## 2014-08-13 ENCOUNTER — Encounter: Payer: Self-pay | Admitting: Internal Medicine

## 2014-08-13 VITALS — BP 108/78 | HR 77 | Temp 98.1°F | Wt 180.0 lb

## 2014-08-13 DIAGNOSIS — Z0000 Encounter for general adult medical examination without abnormal findings: Secondary | ICD-10-CM

## 2014-08-13 DIAGNOSIS — K219 Gastro-esophageal reflux disease without esophagitis: Secondary | ICD-10-CM | POA: Diagnosis not present

## 2014-08-13 MED ORDER — PANTOPRAZOLE SODIUM 40 MG PO TBEC: 40.0000 mg | DELAYED_RELEASE_TABLET | Freq: Every day | ORAL | Status: DC

## 2014-08-13 NOTE — Progress Notes (Signed)
Subjective:    Patient ID: Gabrielle Benson, female    DOB: 15-Apr-1955, 59 y.o.   MRN: [id number]  HPI  Here for wellness and f/u;  Overall doing ok;  Pt denies Chest pain, worsening SOB, DOE, wheezing, orthopnea, PND, worsening LE edema, palpitations, dizziness or syncope.  Pt denies neurological change such as new headache, facial or extremity weakness.  Pt denies polydipsia, polyuria, or low sugar symptoms. Pt states overall good compliance with treatment and medications, good tolerability, and has been trying to follow appropriate diet.  Pt denies worsening depressive symptoms, suicidal ideation or panic. No fever, night sweats, wt loss, loss of appetite, or other constitutional symptoms.  Pt states good ability with ADL's, has low fall risk, home safety reviewed and adequate, no other significant changes in hearing or vision, and only occasionally active with exercise. For pap aug 19.  CXR apirl 2016 neg for acute. Saw Dr Demetrius Charity, suggested needs PPI to continue to help with the ongoing cough.  S/p coritisone while she was there Past Medical History  Diagnosis Date  . Fibromyalgia   . DJD (degenerative joint disease)   . Cervicalgia     Chronic  . DDD (degenerative disc disease)     Cervical  . Allergic rhinitis   . Anemia     Iron deficiency  . Hyperlipidemia   . ANA positive   . Multinodular thyroid   . Neuropathy, peripheral   . Vitamin D deficiency 03/18/2011   Past Surgical History  Procedure Laterality Date  . Abdominal hysterectomy      reports that she has never smoked. She has never used smokeless tobacco. She reports that she drinks alcohol. She reports that she does not use illicit drugs. family history includes Colon cancer in an other family member; Diabetes in her mother; Heart disease in her father; Lung cancer in an other family member; Sickle cell trait in an other family member. There is no history of Stomach cancer, Rectal cancer, or Esophageal  cancer. Allergies  Allergen Reactions  . Penicillins     rash  . Duloxetine     REACTION: dizzy, nervous  . Zolpidem Tartrate     REACTION: memory problem   Current Outpatient Prescriptions on File Prior to Visit  Medication Sig Dispense Refill  . cetirizine (ZYRTEC) 10 MG tablet Take 1 tablet (10 mg total) by mouth daily. 30 tablet 11  . Cyclobenzaprine HCl (AMRIX PO) Take by mouth.    . diclofenac sodium (VOLTAREN) 1 % GEL Apply topically.    Marland Kitchen ESTRACE VAGINAL 0.1 MG/GM vaginal cream     . fluticasone (FLONASE) 50 MCG/ACT nasal spray PLACE 2 SPRAYS INTO BOTH NOSTRILS DAILY. 16 g 2  . methocarbamol (ROBAXIN) 500 MG tablet Take 500 mg by mouth 2 (two) times daily.    . montelukast (SINGULAIR) 10 MG tablet Take 1 tablet (10 mg total) by mouth daily. 30 tablet 11  . pantoprazole (PROTONIX) 40 MG tablet Take 1 tablet (40 mg total) by mouth daily. 90 tablet 3  . predniSONE (DELTASONE) 10 MG tablet 3 tabs by mouth per day for 3 days,2tabs per day for 3 days,1tab per day for 3 days 18 tablet 0  . pregabalin (LYRICA) 150 MG capsule Take 150 mg by mouth 3 (three) times daily.    Marland Kitchen triamcinolone (NASACORT) 55 MCG/ACT AERO nasal inhaler Place 2 sprays into the nose daily.    . valACYclovir (VALTREX) 1000 MG tablet 2 pills bid 4 tablet 0  . Vitamin  D, Ergocalciferol, (DRISDOL) 50000 UNITS CAPS capsule Take 50,000 Units by mouth.    Marland Kitchen albuterol (PROVENTIL HFA;VENTOLIN HFA) 108 (90 BASE) MCG/ACT inhaler Inhale 2 puffs into the lungs every 4 (four) hours as needed for wheezing or shortness of breath. (Patient not taking: Reported on 08/13/2014) 1 Inhaler 0  . chlorpheniramine-HYDROcodone (TUSSIONEX PENNKINETIC ER) 10-8 MG/5ML LQCR Take 5 mLs by mouth every 12 (twelve) hours as needed for cough. (Patient not taking: Reported on 08/13/2014) 115 mL 0  . HYDROcodone-homatropine (HYCODAN) 5-1.5 MG/5ML syrup Take 5 mLs by mouth every 6 (six) hours as needed for cough. (Patient not taking: Reported on 04/19/2014)  180 mL 0   No current facility-administered medications on file prior to visit.   Review of Systems Constitutional: Negative for increased diaphoresis, other activity, appetite or siginficant weight change other than noted HENT: Negative for worsening hearing loss, ear pain, facial swelling, mouth sores and neck stiffness.   Eyes: Negative for other worsening pain, redness or visual disturbance.  Respiratory: Negative for shortness of breath and wheezing  Cardiovascular: Negative for chest pain and palpitations.  Gastrointestinal: Negative for diarrhea, blood in stool, abdominal distention or other pain Genitourinary: Negative for hematuria, flank pain or change in urine volume.  Musculoskeletal: Negative for myalgias or other joint complaints.  Skin: Negative for color change and wound or drainage.  Neurological: Negative for syncope and numbness. other than noted Hematological: Negative for adenopathy. or other swelling Psychiatric/Behavioral: Negative for hallucinations, SI, self-injury, decreased concentration or other worsening agitation.       Objective:   Physical Exam BP 108/78 mmHg  Pulse 77  Temp(Src) 98.1 F (36.7 C)  Wt 180 lb (81.647 kg)  SpO2 99% VS noted,  Constitutional: Pt is oriented to person, place, and time. Appears well-developed and well-nourished, in no significant distress Head: Normocephalic and atraumatic.  Right Ear: External ear normal.  Left Ear: External ear normal.  Nose: Nose normal.  Mouth/Throat: Oropharynx is clear and moist.  Eyes: Conjunctivae and EOM are normal. Pupils are equal, round, and reactive to light.  Neck: Normal range of motion. Neck supple. No JVD present. No tracheal deviation present or significant neck LA or mass Cardiovascular: Normal rate, regular rhythm, normal heart sounds and intact distal pulses.   Pulmonary/Chest: Effort normal and breath sounds without rales or wheezing  Abdominal: Soft. Bowel sounds are normal. NT.  No HSM  Musculoskeletal: Normal range of motion. Exhibits no edema.  Lymphadenopathy:  Has no cervical adenopathy.  Neurological: Pt is alert and oriented to person, place, and time. Pt has normal reflexes. No cranial nerve deficit. Motor grossly intact Skin: Skin is warm and dry. No rash noted.  Psychiatric:  Has normal mood and affect. Behavior is normal.     Assessment & Plan:

## 2014-08-13 NOTE — Progress Notes (Signed)
Pre visit review using our clinic review tool, if applicable. No additional management support is needed unless otherwise documented below in the visit note. 

## 2014-08-13 NOTE — Assessment & Plan Note (Signed)
For re-start PPI,  to f/u any worsening symptoms or concerns

## 2014-08-13 NOTE — Assessment & Plan Note (Signed)

## 2014-08-13 NOTE — Patient Instructions (Signed)
Please continue all other medications as before, and refills have been done if requested. - the protonix  Please have the pharmacy call with any other refills you may need.  Please continue your efforts at being more active, low cholesterol diet, and weight control.  You are otherwise up to date with prevention measures today.  Please keep your appointments with your specialists as you may have planned  Please go to the LAB in the Basement (turn left off the elevator) for the tests to be done tomorrow or soon  You will be contacted by phone if any changes need to be made immediately.  Otherwise, you will receive a letter about your results with an explanation, but please check with MyChart first.  Please remember to sign up for MyChart if you have not done so, as this will be important to you in the future with finding out test results, communicating by private email, and scheduling acute appointments online when needed.  Please return in 1 year for your yearly visit, or sooner if needed, with Lab testing done 3-5 days before

## 2014-09-07 ENCOUNTER — Ambulatory Visit: Payer: 59 | Admitting: Family Medicine

## 2014-09-07 ENCOUNTER — Encounter: Payer: Self-pay | Admitting: Family Medicine

## 2014-09-07 ENCOUNTER — Ambulatory Visit (INDEPENDENT_AMBULATORY_CARE_PROVIDER_SITE_OTHER): Payer: 59 | Admitting: Family Medicine

## 2014-09-07 VITALS — BP 118/78 | HR 73 | Temp 98.7°F | Ht 64.0 in | Wt 169.2 lb

## 2014-09-07 DIAGNOSIS — L089 Local infection of the skin and subcutaneous tissue, unspecified: Secondary | ICD-10-CM | POA: Diagnosis not present

## 2014-09-07 DIAGNOSIS — S80812A Abrasion, left lower leg, initial encounter: Secondary | ICD-10-CM

## 2014-09-07 DIAGNOSIS — L03116 Cellulitis of left lower limb: Secondary | ICD-10-CM

## 2014-09-07 MED ORDER — CEPHALEXIN 500 MG PO CAPS: 500.0000 mg | ORAL_CAPSULE | Freq: Three times a day (TID) | ORAL | Status: AC

## 2014-09-07 NOTE — Progress Notes (Signed)
   Subjective:    Patient ID: Gabrielle Benson, female    DOB: [birth year], 57 y.o.   MRN: [id number]  HPI Here for an abrasion on the left lower leg that has become infected. She struck the leg on a metal cabinet 4 days ago and it has become tender and red. No fever. She has been applying Neosporin. She got a tetanus booster yesterday in her GYN doctor's office.   Review of Systems  Constitutional: Negative.   Skin: Positive for wound.       Objective:   Physical Exam  Constitutional: She appears well-developed and well-nourished. No distress.  Skin:  Small abrasion on the left lower lateral leg which has a zone of erythema around it. This is warm and tender           Assessment & Plan:  Cellulitis. Cover with Keflex and recheck prn

## 2014-09-07 NOTE — Progress Notes (Signed)
Pre visit review using our clinic review tool, if applicable. No additional management support is needed unless otherwise documented below in the visit note. 

## 2014-09-12 ENCOUNTER — Telehealth: Payer: Self-pay | Admitting: Internal Medicine

## 2014-09-12 NOTE — Telephone Encounter (Signed)
Can she be more specific about the name and the reaction description, as for example worsening pain alone would most not likely be an allergic reaction

## 2014-09-12 NOTE — Telephone Encounter (Signed)
Pt was in on Sat with an abrasion on her leg and Dr. Sarajane Jews gave her an antibiotic which she had an allergic reaction to.  She wanted to let you know that she stopped taking the medication and her leg is healing very well.

## 2014-09-13 NOTE — Telephone Encounter (Signed)
Left message on machine for pt to return my call  

## 2014-09-30 ENCOUNTER — Other Ambulatory Visit (HOSPITAL_COMMUNITY): Payer: 59

## 2014-10-01 ENCOUNTER — Encounter (HOSPITAL_COMMUNITY)
Admission: RE | Admit: 2014-10-01 | Discharge: 2014-10-01 | Disposition: A | Discharge status: Home / Self Care | Payer: 59 | Source: Ambulatory Visit | Attending: Orthopedic Surgery | Admitting: Orthopedic Surgery

## 2014-10-01 ENCOUNTER — Encounter (HOSPITAL_COMMUNITY): Payer: Self-pay

## 2014-10-01 DIAGNOSIS — M179 Osteoarthritis of knee, unspecified: Secondary | ICD-10-CM | POA: Diagnosis not present

## 2014-10-01 DIAGNOSIS — Z01818 Encounter for other preprocedural examination: Secondary | ICD-10-CM | POA: Diagnosis present

## 2014-10-01 HISTORY — DX: Adverse effect of unspecified anesthetic, initial encounter: T41.45XA

## 2014-10-01 HISTORY — DX: Gastro-esophageal reflux disease without esophagitis: K21.9

## 2014-10-01 HISTORY — DX: Other complications of anesthesia, initial encounter: T88.59XA

## 2014-10-01 HISTORY — DX: Pneumonia, unspecified organism: J18.9

## 2014-10-01 HISTORY — DX: Anxiety disorder, unspecified: F41.9

## 2014-10-01 LAB — BASIC METABOLIC PANEL
ANION GAP: 5 (ref 5–15)
BUN: 8 mg/dL (ref 6–20)
CHLORIDE: 105 mmol/L (ref 101–111)
CO2: 30 mmol/L (ref 22–32)
Calcium: 9.7 mg/dL (ref 8.9–10.3)
Creatinine, Ser: 0.55 mg/dL (ref 0.44–1.00)
GFR calc Af Amer: >60 mL/min (ref >60)
GFR calc non Af Amer: >60 mL/min (ref >60)
Glucose, Bld: 108 mg/dL — ABNORMAL HIGH (ref 65–99)
POTASSIUM: 3.4 mmol/L — AB (ref 3.5–5.1)
SODIUM: 140 mmol/L (ref 135–145)

## 2014-10-01 LAB — URINALYSIS, ROUTINE W REFLEX MICROSCOPIC
BILIRUBIN URINE: NEGATIVE
GLUCOSE, UA: NEGATIVE mg/dL
HGB URINE DIPSTICK: NEGATIVE
KETONES UR: NEGATIVE mg/dL
Leukocytes, UA: NEGATIVE
Nitrite: NEGATIVE
PROTEIN: NEGATIVE mg/dL
Specific Gravity, Urine: 1.005 (ref 1.005–1.030)
Urobilinogen, UA: 0.2 mg/dL (ref 0.0–1.0)
pH: 6.5 (ref 5.0–8.0)

## 2014-10-01 LAB — SURGICAL PCR SCREEN
MRSA, PCR: NEGATIVE
Staphylococcus aureus: NEGATIVE

## 2014-10-01 LAB — CBC
HEMATOCRIT: 33.8 % — AB (ref 36.0–46.0)
HEMOGLOBIN: 10.8 g/dL — AB (ref 12.0–15.0)
MCH: 27.9 pg (ref 26.0–34.0)
MCHC: 32 g/dL (ref 30.0–36.0)
MCV: 87.3 fL (ref 78.0–100.0)
Platelets: 305 10*3/uL (ref 150–400)
RBC: 3.87 MIL/uL (ref 3.87–5.11)
RDW: 13 % (ref 11.5–15.5)
WBC: 4.7 10*3/uL (ref 4.0–10.5)

## 2014-10-01 LAB — PROTIME-INR
INR: 0.94 (ref 0.00–1.49)
Prothrombin Time: 12.8 seconds (ref 11.6–15.2)

## 2014-10-01 LAB — APTT: APTT: 35 s (ref 24–37)

## 2014-10-01 LAB — ABO/RH: ABO/RH(D): O POS

## 2014-10-01 NOTE — Progress Notes (Signed)
08/29/2014 and 09/26/2014- pre-operative clearance from Dr. Jenny Reichmann on chart.

## 2014-10-01 NOTE — Patient Instructions (Addendum)
Gabrielle Benson  10/01/2014   Your procedure is scheduled on: Monday 10/07/2014  Report to [facility] Main  Entrance take [place]  elevators to 3rd floor to  [place] at  [place] AM.  Call this number if you have problems the morning of surgery [phone]   Remember: ONLY 1 PERSON MAY GO WITH YOU TO SHORT STAY TO GET  READY MORNING OF [place].  Do not eat food or drink liquids :After Midnight.     Take these medicines the morning of surgery with A SIP OF WATER: SINGULAIR, ASTELIN NASAL SPRAY                               You may not have any metal on your body including hair pins and              piercings  Do not wear jewelry, make-up, lotions, powders or perfumes, deodorant             Do not wear nail polish.  Do not shave  48 hours prior to surgery.              Men may shave face and neck.   Do not bring valuables to the hospital. [place].  Contacts, dentures or bridgework may not be worn into surgery.  Leave suitcase in the car. After surgery it may be brought to your room.     Patients discharged the day of surgery will not be allowed to drive home.  Name and phone number of your driver:  Special Instructions: N/A              Please read over the following fact sheets you were given: _____________________________________________________________________             [facility] - Preparing for Surgery Before surgery, you can play an important role.  Because skin is not sterile, your skin needs to be as free of germs as possible.  You can reduce the number of germs on your skin by washing with CHG (chlorahexidine gluconate) soap before surgery.  CHG is an antiseptic cleaner which kills germs and bonds with the skin to continue killing germs even after washing. Please DO NOT use if you have an allergy to CHG or antibacterial soaps.  If your skin becomes reddened/irritated stop using the  CHG and inform your nurse when you arrive at Short Stay. Do not shave (including legs and underarms) for at least 48 hours prior to the first CHG shower.  You may shave your face/neck. Please follow these instructions carefully:  1.  Shower with CHG Soap the night before surgery and the  morning of Surgery.  2.  If you choose to wash your hair, wash your hair first as usual with your  normal  shampoo.  3.  After you shampoo, rinse your hair and body thoroughly to remove the  shampoo.                           4.  Use CHG as you would any other liquid soap.  You can apply chg directly  to the skin and wash  Gently with a scrungie or clean washcloth.  5.  Apply the CHG Soap to your body ONLY FROM THE NECK DOWN.   Do not use on face/ open                           Wound or open sores. Avoid contact with eyes, ears mouth and genitals (private parts).                       Wash face,  Genitals (private parts) with your normal soap.             6.  Wash thoroughly, paying special attention to the area where your surgery  will be performed.  7.  Thoroughly rinse your body with warm water from the neck down.  8.  DO NOT shower/wash with your normal soap after using and rinsing off  the CHG Soap.                9.  Pat yourself dry with a clean towel.            10.  Wear clean pajamas.            11.  Place clean sheets on your bed the night of your first shower and do not  sleep with pets. Day of Surgery : Do not apply any lotions/deodorants the morning of surgery.  Please wear clean clothes to the hospital/surgery center.  FAILURE TO FOLLOW THESE INSTRUCTIONS MAY RESULT IN THE CANCELLATION OF YOUR SURGERY PATIENT SIGNATURE_________________________________  NURSE SIGNATURE__________________________________  ________________________________________________________________________   Adam Phenix  An incentive spirometer is a tool that can help keep your lungs clear  and active. This tool measures how well you are filling your lungs with each breath. Taking long deep breaths may help reverse or decrease the chance of developing breathing (pulmonary) problems (especially infection) following:  A long period of time when you are unable to move or be active. BEFORE THE PROCEDURE   If the spirometer includes an indicator to show your best effort, your nurse or respiratory therapist will set it to a desired goal.  If possible, sit up straight or lean slightly forward. Try not to slouch.  Hold the incentive spirometer in an upright position. INSTRUCTIONS FOR USE   Sit on the edge of your bed if possible, or sit up as far as you can in bed or on a chair.  Hold the incentive spirometer in an upright position.  Breathe out normally.  Place the mouthpiece in your mouth and seal your lips tightly around it.  Breathe in slowly and as deeply as possible, raising the piston or the ball toward the top of the column.  Hold your breath for 3-5 seconds or for as long as possible. Allow the piston or ball to fall to the bottom of the column.  Remove the mouthpiece from your mouth and breathe out normally.  Rest for a few seconds and repeat Steps 1 through 7 at least 10 times every 1-2 hours when you are awake. Take your time and take a few normal breaths between deep breaths.  The spirometer may include an indicator to show your best effort. Use the indicator as a goal to work toward during each repetition.  After each set of 10 deep breaths, practice coughing to be sure your lungs are clear. If you have an incision (the cut made at the time of surgery),  support your incision when coughing by placing a pillow or rolled up towels firmly against it. Once you are able to get out of bed, walk around indoors and cough well. You may stop using the incentive spirometer when instructed by your caregiver.  RISKS AND COMPLICATIONS  Take your time so you do not get dizzy or  light-headed.  If you are in pain, you may need to take or ask for pain medication before doing incentive spirometry. It is harder to take a deep breath if you are having pain. AFTER USE  Rest and breathe slowly and easily.  It can be helpful to keep track of a log of your progress. Your caregiver can provide you with a simple table to help with this. If you are using the spirometer at home, follow these instructions: [place] IF:   You are having difficultly using the spirometer.  You have trouble using the spirometer as often as instructed.  Your pain medication is not giving enough relief while using the spirometer.  You develop fever of 100.5 F (38.1 C) or higher. SEEK IMMEDIATE MEDICAL CARE IF:   You cough up bloody sputum that had not been present before.  You develop fever of 102 F (38.9 C) or greater.  You develop worsening pain at or near the incision site. MAKE SURE YOU:   Understand these instructions.  Will watch your condition.  Will get help right away if you are not doing well or get worse. Document Released: 05/17/2006 Document Revised: 03/29/2011 Document Reviewed: 07/18/2006 Exit[employer] Patient Information 2014 [employer], [place].   ________________________________________________________________________  WHAT IS A BLOOD TRANSFUSION? Blood Transfusion Information  A transfusion is the replacement of blood or some of its parts. Blood is made up of multiple cells which provide different functions.  Red blood cells carry oxygen and are used for blood loss replacement.  White blood cells fight against infection.  Platelets control bleeding.  Plasma helps clot blood.  Other blood products are available for specialized needs, such as hemophilia or other clotting disorders. BEFORE THE TRANSFUSION  Who gives blood for transfusions?   Healthy volunteers who are fully evaluated to make sure their blood is safe. This is blood bank  blood. Transfusion therapy is the safest it has ever been in the practice of medicine. Before blood is taken from a donor, a complete history is taken to make sure that person has no history of diseases nor engages in risky social behavior (examples are intravenous drug use or sexual activity with multiple partners). The donor's travel history is screened to minimize risk of transmitting infections, such as malaria. The donated blood is tested for signs of infectious diseases, such as HIV and hepatitis. The blood is then tested to be sure it is compatible with you in order to minimize the chance of a transfusion reaction. If you or a relative donates blood, this is often done in anticipation of surgery and is not appropriate for emergency situations. It takes many days to process the donated blood. RISKS AND COMPLICATIONS Although transfusion therapy is very safe and saves many lives, the main dangers of transfusion include:   Getting an infectious disease.  Developing a transfusion reaction. This is an allergic reaction to something in the blood you were given. Every precaution is taken to prevent this. The decision to have a blood transfusion has been considered carefully by your caregiver before blood is given. Blood is not given unless the benefits outweigh the risks. AFTER THE TRANSFUSION  Right after receiving a blood transfusion, you will usually feel much better and more energetic. This is especially true if your red blood cells have gotten low (anemic). The transfusion raises the level of the red blood cells which carry oxygen, and this usually causes an energy increase.  The nurse administering the transfusion will monitor you carefully for complications. HOME CARE INSTRUCTIONS  No special instructions are needed after a transfusion. You may find your energy is better. Speak with your caregiver about any limitations on activity for underlying diseases you may have. SEEK MEDICAL CARE IF:    Your condition is not improving after your transfusion.  You develop redness or irritation at the intravenous (IV) site. SEEK IMMEDIATE MEDICAL CARE IF:  Any of the following symptoms occur over the next 12 hours:  Shaking chills.  You have a temperature by mouth above 102 F (38.9 C), not controlled by medicine.  Chest, back, or muscle pain.  People around you feel you are not acting correctly or are confused.  Shortness of breath or difficulty breathing.  Dizziness and fainting.  You get a rash or develop hives.  You have a decrease in urine output.  Your urine turns a dark color or changes to pink, red, or brown. Any of the following symptoms occur over the next 10 days:  You have a temperature by mouth above 102 F (38.9 C), not controlled by medicine.  Shortness of breath.  Weakness after normal activity.  The white part of the eye turns yellow (jaundice).  You have a decrease in the amount of urine or are urinating less often.  Your urine turns a dark color or changes to pink, red, or brown. Document Released: 01/02/2000 Document Revised: 03/29/2011 Document Reviewed: 08/21/2007 [facility] Patient Information 2014 [place], [place].  _______________________________________________________________________

## 2014-10-03 NOTE — H&P (Signed)
UNICOMPARTMENTAL KNEE ADMISSION H&P  Patient is being admitted for right lateral unicompartmental knee arthroplasty.  Subjective:  Chief Complaint:  Right knee lateral compartmental primary OA /pain    HPI: Gabrielle Benson, 59 y.o. female female, has a history of pain and functional disability in the right and has failed non-surgical conservative treatments for greater than 12 weeks to include NSAID's and/or analgesics, corticosteriod injections, viscosupplementation injections and activity modification.  Onset of symptoms was gradual, starting >10 years ago with gradually worsening course since that time. The patient noted prior procedures on the knee to include  arthroscopy and menisectomy on the right knee(s).  Patient currently rates pain in the right knee(s) at 10 out of 10 with activity. Patient has worsening of pain with activity and weight bearing, pain that interferes with activities of daily living, pain with passive range of motion, crepitus and joint swelling.  Patient has evidence of periarticular osteophytes and joint space narrowing of the lateral compartment by imaging studies.  There is no active infection.  Risks, benefits and expectations were discussed with the patient.  Risks including but not limited to the risk of anesthesia, blood clots, nerve damage, blood vessel damage, failure of the prosthesis, infection and up to and including death.  Patient understand the risks, benefits and expectations and wishes to proceed with surgery.   PCP: Cathlean Cower, MD  D/C Plans:      Home with HHPT  Post-op Meds:       No Rx given  Tranexamic Acid:      To be given - IV  Decadron:    It is to be given  FYI:     ASA post-op  Norco post-op    Past Medical History  Diagnosis Date  . Fibromyalgia   . DJD (degenerative joint disease)   . Cervicalgia     Chronic  . DDD (degenerative disc disease)     Cervical  . Allergic rhinitis   . Anemia     Iron deficiency  .  Hyperlipidemia   . ANA positive   . Multinodular thyroid   . Neuropathy, peripheral   . Vitamin D deficiency 03/18/2011  . Complication of anesthesia     hard to go to sleep with Anesthesia-had to have Benadryl with Colonoscopy  . GERD (gastroesophageal reflux disease)   . Pneumonia   . Anxiety      Past Surgical History  Procedure Laterality Date  . Abdominal hysterectomy    . Abdominal hysterectomy      partial  . Tonsillectomy      age 73  . Dilation and curettage of uterus    . Bunionectomy  2011    left foot  . Knee arthroscopy  2004    right  . Myomectomy  2002    prior to hysterectomy    Allergies  Allergen Reactions  . Duloxetine     REACTION: dizzy, nervous  . Zolpidem Tartrate     REACTION: memory problem  . Penicillins     Has patient had a PCN reaction causing immediate rash, facial/tongue/throat swelling, SOB or lightheadedness with hypotension: No Has patient had a PCN reaction causing severe rash involving mucus membranes or skin necrosis: No Has patient had a PCN reaction that required hospitalization: No Has patient had a PCN reaction occurring within the last 10 years: No If all of the above answers are "NO", then may proceed with Cephalosporin use.      Social History  Substance Use Topics  .  Smoking status: Never Smoker   . Smokeless tobacco: Never Used  . Alcohol Use: 0.0 oz/week    3-4 Glasses of wine per week    Family History  Problem Relation Age of Onset  . Heart disease Father   . Sickle cell trait    . Lung cancer    . Diabetes Mother   . Colon cancer      Aunt  . Stomach cancer Neg Hx   . Rectal cancer Neg Hx   . Esophageal cancer Neg Hx      Review of Systems  Constitutional: Negative.   Eyes: Negative.   Respiratory: Negative.   Cardiovascular: Negative.   Gastrointestinal: Positive for heartburn.  Genitourinary: Negative.   Musculoskeletal: Positive for joint pain and neck pain.  Skin: Negative.   Neurological:  Negative.   Endo/Heme/Allergies: Positive for environmental allergies.  Psychiatric/Behavioral: The patient is nervous/anxious.      Objective:   Physical Exam  Constitutional: She is oriented to person, place, and time and well-developed, well-nourished, and in no distress.  HENT:  Head: Normocephalic.  Eyes: Pupils are equal, round, and reactive to light.  Neck: Neck supple. No JVD present. No tracheal deviation present. No thyromegaly present.  Cardiovascular: Normal rate, regular rhythm, normal heart sounds and intact distal pulses.   Pulmonary/Chest: Effort normal and breath sounds normal. No stridor. No respiratory distress. She has no wheezes.  Abdominal: Soft. There is no tenderness. There is no guarding.  Musculoskeletal:       Right knee: She exhibits decreased range of motion, swelling and bony tenderness. She exhibits no ecchymosis, no deformity, no laceration and no erythema. Tenderness found. Lateral joint line tenderness noted. No medial joint line tenderness noted.  Lymphadenopathy:    She has no cervical adenopathy.  Neurological: She is alert and oriented to person, place, and time. A sensory deficit (neuropathy bilateral LEs) is present.  Skin: Skin is warm and dry.  Psychiatric: Affect normal.       Labs:  Estimated body mass index is 30.88 kg/(m^2) as calculated from the following:   Height as of 04/19/14: 5\' 4"  (1.626 m).   Weight as of 08/13/14: 81.647 kg (180 lb).   Imaging Review Plain radiographs demonstrate severe degenerative joint disease of the right knee(s) lateral compartment. The overall alignment is neutral. The bone quality appears to be good for age and reported activity level.  Assessment/Plan:  End stage arthritis, right knee lateral compartment  The patient history, physical examination, clinical judgment of the provider and imaging studies are consistent with end stage degenerative joint disease of the right knee(s) and lateral  unicompartmental knee arthroplasty is deemed medically necessary. The treatment options including medical management, injection therapy arthroscopy and arthroplasty were discussed at length. The risks and benefits of total knee arthroplasty were presented and reviewed. The risks due to aseptic loosening, infection, stiffness, patella tracking problems, thromboembolic complications and other imponderables were discussed. The patient acknowledged the explanation, agreed to proceed with the plan and consent was signed. Patient is being admitted for outpatient / observation treatment for surgery, pain control, PT, OT, prophylactic antibiotics, VTE prophylaxis, progressive ambulation and ADL's and discharge planning. The patient is planning to be discharged home with home health services.    West Pugh Tifanie Gardiner   PA-C  10/03/2014, 10:25 AM

## 2014-10-07 ENCOUNTER — Observation Stay (HOSPITAL_COMMUNITY)
Admission: RE | Admit: 2014-10-07 | Discharge: 2014-10-08 | Disposition: A | Discharge status: Home / Self Care | Payer: 59 | Source: Ambulatory Visit | Attending: Orthopedic Surgery | Admitting: Orthopedic Surgery

## 2014-10-07 ENCOUNTER — Ambulatory Visit (HOSPITAL_COMMUNITY): Payer: 59 | Admitting: Anesthesiology

## 2014-10-07 ENCOUNTER — Encounter (HOSPITAL_COMMUNITY): Payer: Self-pay

## 2014-10-07 ENCOUNTER — Encounter (HOSPITAL_COMMUNITY)
Admission: RE | Disposition: A | Discharge status: Home / Self Care | Payer: Self-pay | Source: Ambulatory Visit | Attending: Orthopedic Surgery

## 2014-10-07 DIAGNOSIS — M5030 Other cervical disc degeneration, unspecified cervical region: Secondary | ICD-10-CM | POA: Diagnosis not present

## 2014-10-07 DIAGNOSIS — M797 Fibromyalgia: Secondary | ICD-10-CM | POA: Diagnosis not present

## 2014-10-07 DIAGNOSIS — E559 Vitamin D deficiency, unspecified: Secondary | ICD-10-CM | POA: Insufficient documentation

## 2014-10-07 DIAGNOSIS — M1711 Unilateral primary osteoarthritis, right knee: Principal | ICD-10-CM | POA: Insufficient documentation

## 2014-10-07 DIAGNOSIS — E042 Nontoxic multinodular goiter: Secondary | ICD-10-CM | POA: Insufficient documentation

## 2014-10-07 DIAGNOSIS — Z7982 Long term (current) use of aspirin: Secondary | ICD-10-CM | POA: Insufficient documentation

## 2014-10-07 DIAGNOSIS — K219 Gastro-esophageal reflux disease without esophagitis: Secondary | ICD-10-CM | POA: Insufficient documentation

## 2014-10-07 DIAGNOSIS — E785 Hyperlipidemia, unspecified: Secondary | ICD-10-CM | POA: Insufficient documentation

## 2014-10-07 DIAGNOSIS — F419 Anxiety disorder, unspecified: Secondary | ICD-10-CM | POA: Insufficient documentation

## 2014-10-07 DIAGNOSIS — Z96651 Presence of right artificial knee joint: Secondary | ICD-10-CM

## 2014-10-07 DIAGNOSIS — D649 Anemia, unspecified: Secondary | ICD-10-CM | POA: Insufficient documentation

## 2014-10-07 DIAGNOSIS — E669 Obesity, unspecified: Secondary | ICD-10-CM | POA: Diagnosis not present

## 2014-10-07 DIAGNOSIS — Z880 Allergy status to penicillin: Secondary | ICD-10-CM | POA: Diagnosis not present

## 2014-10-07 DIAGNOSIS — Z6833 Body mass index (BMI) 33.0-33.9, adult: Secondary | ICD-10-CM | POA: Insufficient documentation

## 2014-10-07 DIAGNOSIS — Z888 Allergy status to other drugs, medicaments and biological substances status: Secondary | ICD-10-CM | POA: Insufficient documentation

## 2014-10-07 HISTORY — PX: PARTIAL KNEE ARTHROPLASTY: SHX2174

## 2014-10-07 LAB — TYPE AND SCREEN
ABO/RH(D): O POS
Antibody Screen: NEGATIVE

## 2014-10-07 SURGERY — ARTHROPLASTY, KNEE, UNICOMPARTMENTAL
Anesthesia: Spinal | Site: Knee | Laterality: Right

## 2014-10-07 MED ORDER — PROMETHAZINE HCL 25 MG/ML IJ SOLN: 6.2500 mg | INTRAMUSCULAR | Status: DC | PRN

## 2014-10-07 MED ORDER — METOCLOPRAMIDE HCL 10 MG PO TABS: 5.0000 mg | ORAL_TABLET | Freq: Three times a day (TID) | ORAL | Status: DC | PRN

## 2014-10-07 MED ORDER — AZELASTINE HCL 0.1 % NA SOLN
1.0000 | Freq: Two times a day (BID) | NASAL | Status: DC
  Administered 2014-10-07: 1 via NASAL
  Filled 2014-10-07: qty 30

## 2014-10-07 MED ORDER — DIPHENHYDRAMINE HCL 25 MG PO CAPS: 25.0000 mg | ORAL_CAPSULE | Freq: Four times a day (QID) | ORAL | Status: DC | PRN

## 2014-10-07 MED ORDER — ONDANSETRON HCL 4 MG/2ML IJ SOLN
INTRAMUSCULAR | Status: DC | PRN
  Administered 2014-10-07: 4 mg via INTRAVENOUS

## 2014-10-07 MED ORDER — DEXAMETHASONE SODIUM PHOSPHATE 10 MG/ML IJ SOLN
10.0000 mg | Freq: Once | INTRAMUSCULAR | Status: AC
  Administered 2014-10-07: 10 mg via INTRAVENOUS

## 2014-10-07 MED ORDER — CELECOXIB 200 MG PO CAPS
200.0000 mg | ORAL_CAPSULE | Freq: Two times a day (BID) | ORAL | Status: DC
  Administered 2014-10-07 – 2014-10-08 (×2): 200 mg via ORAL
  Filled 2014-10-07 (×3): qty 1

## 2014-10-07 MED ORDER — PHENOL 1.4 % MT LIQD
1.0000 | OROMUCOSAL | Status: DC | PRN
Start: 2014-10-07 — End: 2014-10-08
  Filled 2014-10-07: qty 177

## 2014-10-07 MED ORDER — FENTANYL CITRATE (PF) 250 MCG/5ML IJ SOLN
INTRAMUSCULAR | Status: DC | PRN
  Administered 2014-10-07 (×2): 50 ug via INTRAVENOUS

## 2014-10-07 MED ORDER — POLYETHYLENE GLYCOL 3350 17 G PO PACK
17.0000 g | PACK | Freq: Two times a day (BID) | ORAL | Status: DC
  Administered 2014-10-07 – 2014-10-08 (×2): 17 g via ORAL

## 2014-10-07 MED ORDER — ONDANSETRON HCL 4 MG PO TABS: 4.0000 mg | ORAL_TABLET | Freq: Four times a day (QID) | ORAL | Status: DC | PRN

## 2014-10-07 MED ORDER — ALUM & MAG HYDROXIDE-SIMETH 200-200-20 MG/5ML PO SUSP: 30.0000 mL | ORAL | Status: DC | PRN

## 2014-10-07 MED ORDER — LEVOCETIRIZINE DIHYDROCHLORIDE 5 MG PO TABS: 5.0000 mg | ORAL_TABLET | Freq: Every evening | ORAL | Status: DC

## 2014-10-07 MED ORDER — LACTATED RINGERS IV SOLN
INTRAVENOUS | Status: DC
  Administered 2014-10-07: 11:00:00 via INTRAVENOUS
  Administered 2014-10-07: 1000 mL via INTRAVENOUS

## 2014-10-07 MED ORDER — DOCUSATE SODIUM 100 MG PO CAPS
100.0000 mg | ORAL_CAPSULE | Freq: Two times a day (BID) | ORAL | Status: DC
  Administered 2014-10-07 – 2014-10-08 (×2): 100 mg via ORAL

## 2014-10-07 MED ORDER — MAGNESIUM CITRATE PO SOLN: 1.0000 | Freq: Once | ORAL | Status: DC | PRN

## 2014-10-07 MED ORDER — BUPIVACAINE-EPINEPHRINE (PF) 0.25% -1:200000 IJ SOLN
INTRAMUSCULAR | Status: AC
  Filled 2014-10-07: qty 30

## 2014-10-07 MED ORDER — METHOCARBAMOL 500 MG PO TABS
500.0000 mg | ORAL_TABLET | Freq: Four times a day (QID) | ORAL | Status: DC | PRN
  Administered 2014-10-08: 500 mg via ORAL
  Filled 2014-10-07: qty 1

## 2014-10-07 MED ORDER — BISACODYL 10 MG RE SUPP: 10.0000 mg | Freq: Every day | RECTAL | Status: DC | PRN

## 2014-10-07 MED ORDER — TRANEXAMIC ACID 1000 MG/10ML IV SOLN
1000.0000 mg | Freq: Once | INTRAVENOUS | Status: AC
  Administered 2014-10-07: 1000 mg via INTRAVENOUS
  Filled 2014-10-07: qty 10

## 2014-10-07 MED ORDER — ONDANSETRON HCL 4 MG/2ML IJ SOLN
INTRAMUSCULAR | Status: AC
  Filled 2014-10-07: qty 2

## 2014-10-07 MED ORDER — MENTHOL 3 MG MT LOZG: 1.0000 | LOZENGE | OROMUCOSAL | Status: DC | PRN

## 2014-10-07 MED ORDER — DEXAMETHASONE SODIUM PHOSPHATE 10 MG/ML IJ SOLN
10.0000 mg | Freq: Once | INTRAMUSCULAR | Status: AC
  Administered 2014-10-08: 10 mg via INTRAVENOUS
  Filled 2014-10-07: qty 1

## 2014-10-07 MED ORDER — CHLORHEXIDINE GLUCONATE 4 % EX LIQD: 60.0000 mL | Freq: Once | CUTANEOUS | Status: DC

## 2014-10-07 MED ORDER — FERROUS SULFATE 325 (65 FE) MG PO TABS
325.0000 mg | ORAL_TABLET | Freq: Three times a day (TID) | ORAL | Status: DC
  Administered 2014-10-08: 325 mg via ORAL
  Filled 2014-10-07 (×5): qty 1

## 2014-10-07 MED ORDER — MONTELUKAST SODIUM 10 MG PO TABS
10.0000 mg | ORAL_TABLET | Freq: Every day | ORAL | Status: DC
  Administered 2014-10-08: 10 mg via ORAL
  Filled 2014-10-07: qty 1

## 2014-10-07 MED ORDER — PROPOFOL 10 MG/ML IV BOLUS
INTRAVENOUS | Status: AC
  Filled 2014-10-07: qty 20

## 2014-10-07 MED ORDER — PANTOPRAZOLE SODIUM 40 MG PO TBEC
40.0000 mg | DELAYED_RELEASE_TABLET | Freq: Every day | ORAL | Status: DC
  Administered 2014-10-07 – 2014-10-08 (×2): 40 mg via ORAL
  Filled 2014-10-07 (×2): qty 1

## 2014-10-07 MED ORDER — PREGABALIN 75 MG PO CAPS
150.0000 mg | ORAL_CAPSULE | Freq: Three times a day (TID) | ORAL | Status: DC
  Administered 2014-10-07 – 2014-10-08 (×3): 150 mg via ORAL
  Filled 2014-10-07 (×3): qty 2

## 2014-10-07 MED ORDER — SODIUM CHLORIDE 0.9 % IV SOLN
INTRAVENOUS | Status: DC
  Administered 2014-10-07 – 2014-10-08 (×2): via INTRAVENOUS
  Filled 2014-10-07 (×4): qty 1000

## 2014-10-07 MED ORDER — DEXTROSE 5 % IV SOLN
500.0000 mg | Freq: Four times a day (QID) | INTRAVENOUS | Status: DC | PRN
  Filled 2014-10-07: qty 5

## 2014-10-07 MED ORDER — OXYCODONE HCL 5 MG/5ML PO SOLN
5.0000 mg | Freq: Once | ORAL | Status: DC | PRN
  Filled 2014-10-07: qty 5

## 2014-10-07 MED ORDER — HYDROCODONE-ACETAMINOPHEN 7.5-325 MG PO TABS
1.0000 | ORAL_TABLET | ORAL | Status: DC
  Administered 2014-10-07: 2 via ORAL
  Administered 2014-10-07: 1 via ORAL
  Administered 2014-10-07 – 2014-10-08 (×4): 2 via ORAL
  Filled 2014-10-07 (×4): qty 2
  Filled 2014-10-07: qty 1
  Filled 2014-10-07: qty 2

## 2014-10-07 MED ORDER — ESTROGENS CONJUGATED 0.625 MG PO TABS
0.6250 mg | ORAL_TABLET | Freq: Every day | ORAL | Status: DC
  Administered 2014-10-08: 0.625 mg via ORAL
  Filled 2014-10-07: qty 1

## 2014-10-07 MED ORDER — KETOROLAC TROMETHAMINE 30 MG/ML IJ SOLN
INTRAMUSCULAR | Status: AC
  Filled 2014-10-07: qty 1

## 2014-10-07 MED ORDER — PROPOFOL INFUSION 10 MG/ML OPTIME
INTRAVENOUS | Status: DC | PRN
  Administered 2014-10-07: 50 ug/kg/min via INTRAVENOUS

## 2014-10-07 MED ORDER — MIDAZOLAM HCL 2 MG/2ML IJ SOLN
INTRAMUSCULAR | Status: AC
  Filled 2014-10-07: qty 4

## 2014-10-07 MED ORDER — BUPIVACAINE-EPINEPHRINE 0.25% -1:200000 IJ SOLN
INTRAMUSCULAR | Status: DC | PRN
  Administered 2014-10-07: 30 mL

## 2014-10-07 MED ORDER — ASPIRIN EC 325 MG PO TBEC
325.0000 mg | DELAYED_RELEASE_TABLET | Freq: Two times a day (BID) | ORAL | Status: DC
  Administered 2014-10-08: 325 mg via ORAL
  Filled 2014-10-07 (×3): qty 1

## 2014-10-07 MED ORDER — FENTANYL CITRATE (PF) 100 MCG/2ML IJ SOLN
INTRAMUSCULAR | Status: AC
  Filled 2014-10-07: qty 4

## 2014-10-07 MED ORDER — BUPIVACAINE IN DEXTROSE 0.75-8.25 % IT SOLN
INTRATHECAL | Status: DC | PRN
  Administered 2014-10-07: 2 mL via INTRATHECAL

## 2014-10-07 MED ORDER — OXYCODONE HCL 5 MG PO TABS: 5.0000 mg | ORAL_TABLET | Freq: Once | ORAL | Status: DC | PRN

## 2014-10-07 MED ORDER — MIDAZOLAM HCL 5 MG/5ML IJ SOLN
INTRAMUSCULAR | Status: DC | PRN
  Administered 2014-10-07 (×2): 1 mg via INTRAVENOUS

## 2014-10-07 MED ORDER — HYDROMORPHONE HCL 1 MG/ML IJ SOLN: 0.5000 mg | INTRAMUSCULAR | Status: DC | PRN

## 2014-10-07 MED ORDER — PROPOFOL 10 MG/ML IV BOLUS
INTRAVENOUS | Status: DC | PRN
Start: 2014-10-07 — End: 2014-10-07

## 2014-10-07 MED ORDER — DEXAMETHASONE SODIUM PHOSPHATE 10 MG/ML IJ SOLN
INTRAMUSCULAR | Status: AC
  Filled 2014-10-07: qty 1

## 2014-10-07 MED ORDER — CEFAZOLIN SODIUM-DEXTROSE 2-3 GM-% IV SOLR
INTRAVENOUS | Status: AC
  Filled 2014-10-07: qty 50

## 2014-10-07 MED ORDER — SODIUM CHLORIDE 0.9 % IJ SOLN
INTRAMUSCULAR | Status: AC
  Filled 2014-10-07: qty 50

## 2014-10-07 MED ORDER — SODIUM CHLORIDE 0.9 % IJ SOLN
INTRAMUSCULAR | Status: DC | PRN
  Administered 2014-10-07: 30 mL

## 2014-10-07 MED ORDER — KETOROLAC TROMETHAMINE 30 MG/ML IJ SOLN
INTRAMUSCULAR | Status: DC | PRN
  Administered 2014-10-07: 30 mg

## 2014-10-07 MED ORDER — LORATADINE 10 MG PO TABS
10.0000 mg | ORAL_TABLET | Freq: Every day | ORAL | Status: DC
  Administered 2014-10-08: 10 mg via ORAL
  Filled 2014-10-07: qty 1

## 2014-10-07 MED ORDER — HYDROMORPHONE HCL 1 MG/ML IJ SOLN: 0.2500 mg | INTRAMUSCULAR | Status: DC | PRN

## 2014-10-07 MED ORDER — CEFAZOLIN SODIUM-DEXTROSE 2-3 GM-% IV SOLR
2.0000 g | INTRAVENOUS | Status: AC
  Administered 2014-10-07: 2 g via INTRAVENOUS

## 2014-10-07 MED ORDER — CEFAZOLIN SODIUM-DEXTROSE 2-3 GM-% IV SOLR
2.0000 g | Freq: Four times a day (QID) | INTRAVENOUS | Status: AC
  Administered 2014-10-07 (×2): 2 g via INTRAVENOUS
  Filled 2014-10-07 (×2): qty 50

## 2014-10-07 MED ORDER — METOCLOPRAMIDE HCL 5 MG/ML IJ SOLN: 5.0000 mg | Freq: Three times a day (TID) | INTRAMUSCULAR | Status: DC | PRN

## 2014-10-07 MED ORDER — ONDANSETRON HCL 4 MG/2ML IJ SOLN: 4.0000 mg | Freq: Four times a day (QID) | INTRAMUSCULAR | Status: DC | PRN

## 2014-10-07 SURGICAL SUPPLY — 49 items
BAG DECANTER FOR FLEXI CONT (MISCELLANEOUS) IMPLANT
BAG ZIPLOCK 12X15 (MISCELLANEOUS) IMPLANT
BANDAGE ELASTIC 6 VELCRO ST LF (GAUZE/BANDAGES/DRESSINGS) ×2 IMPLANT
BANDAGE ESMARK 6X9 LF (GAUZE/BANDAGES/DRESSINGS) ×1 IMPLANT
BLADE SAW RECIPROCATING 77.5 (BLADE) ×2 IMPLANT
BLADE SAW SGTL 13.0X1.19X90.0M (BLADE) ×2 IMPLANT
BNDG ESMARK 6X9 LF (GAUZE/BANDAGES/DRESSINGS) ×2
BOWL SMART MIX CTS (DISPOSABLE) ×2 IMPLANT
CAPT KNEE PARTIAL 1 ×2 IMPLANT
CEMENT HV SMART SET (Cement) ×2 IMPLANT
CUFF TOURN SGL QUICK 34 (TOURNIQUET CUFF) ×1
CUFF TRNQT CYL 34X4X40X1 (TOURNIQUET CUFF) ×1 IMPLANT
DRAPE EXTREMITY T 121X128X90 (DRAPE) ×2 IMPLANT
DRAPE POUCH INSTRU U-SHP 10X18 (DRAPES) ×2 IMPLANT
DRAPE U-SHAPE 47X51 STRL (DRAPES) ×2 IMPLANT
DRSG AQUACEL AG ADV 3.5X10 (GAUZE/BANDAGES/DRESSINGS) ×2 IMPLANT
DURAPREP 26ML APPLICATOR (WOUND CARE) ×4 IMPLANT
ELECT REM PT RETURN 9FT ADLT (ELECTROSURGICAL) ×2
ELECTRODE REM PT RTRN 9FT ADLT (ELECTROSURGICAL) ×1 IMPLANT
FACESHIELD WRAPAROUND (MASK) ×8 IMPLANT
GLOVE BIOGEL PI IND STRL 7.5 (GLOVE) ×7 IMPLANT
GLOVE BIOGEL PI IND STRL 8.5 (GLOVE) ×1 IMPLANT
GLOVE BIOGEL PI INDICATOR 7.5 (GLOVE) ×7
GLOVE BIOGEL PI INDICATOR 8.5 (GLOVE) ×1
GLOVE ECLIPSE 8.0 STRL XLNG CF (GLOVE) ×2 IMPLANT
GLOVE ORTHO TXT STRL SZ7.5 (GLOVE) ×4 IMPLANT
GOWN SPEC L3 XXLG W/TWL (GOWN DISPOSABLE) ×6 IMPLANT
GOWN STRL REUS W/TWL LRG LVL3 (GOWN DISPOSABLE) ×4 IMPLANT
KIT BASIN OR (CUSTOM PROCEDURE TRAY) ×2 IMPLANT
KNEE CAPITATED PARTIAL 1 ×1 IMPLANT
LEGGING LITHOTOMY PAIR STRL (DRAPES) ×2 IMPLANT
LIQUID BAND (GAUZE/BANDAGES/DRESSINGS) ×2 IMPLANT
MANIFOLD NEPTUNE II (INSTRUMENTS) ×2 IMPLANT
NDL SAFETY ECLIPSE 18X1.5 (NEEDLE) ×1 IMPLANT
NEEDLE HYPO 18GX1.5 SHARP (NEEDLE) ×1
PACK TOTAL JOINT (CUSTOM PROCEDURE TRAY) ×2 IMPLANT
PEN SKIN MARKING BROAD (MISCELLANEOUS) ×2 IMPLANT
SUCTION FRAZIER 12FR DISP (SUCTIONS) ×2 IMPLANT
SUT MNCRL AB 4-0 PS2 18 (SUTURE) ×2 IMPLANT
SUT VIC AB 1 CT1 36 (SUTURE) ×2 IMPLANT
SUT VIC AB 2-0 CT1 27 (SUTURE) ×2
SUT VIC AB 2-0 CT1 TAPERPNT 27 (SUTURE) ×2 IMPLANT
SUT VLOC 180 0 24IN GS25 (SUTURE) ×2 IMPLANT
SYR 50ML LL SCALE MARK (SYRINGE) ×2 IMPLANT
TOWEL OR 17X26 10 PK STRL BLUE (TOWEL DISPOSABLE) ×2 IMPLANT
TOWEL OR NON WOVEN STRL DISP B (DISPOSABLE) IMPLANT
TRAY FOLEY W/METER SILVER 14FR (SET/KITS/TRAYS/PACK) IMPLANT
TRAY FOLEY W/METER SILVER 16FR (SET/KITS/TRAYS/PACK) IMPLANT
YANKAUER SUCT BULB TIP NO VENT (SUCTIONS) IMPLANT

## 2014-10-07 NOTE — Anesthesia Procedure Notes (Signed)
Spinal Patient location during procedure: OR Staffing Anesthesiologist: JUDD, BENJAMIN Performed by: anesthesiologist  Preanesthetic Checklist Completed: patient identified, site marked, surgical consent, pre-op evaluation, timeout performed, IV checked, risks and benefits discussed and monitors and equipment checked Spinal Block Patient position: sitting Prep: Betadine Patient monitoring: heart rate, continuous pulse ox and blood pressure Location: L4-5 Injection technique: single-shot Needle Needle type: Spinocan  Needle gauge: 27 G Needle length: 9 cm Additional Notes Expiration date of kit checked and confirmed. Patient tolerated procedure well, without complications.

## 2014-10-07 NOTE — Anesthesia Preprocedure Evaluation (Addendum)
Anesthesia Evaluation  Patient identified by MRN, date of birth, ID band Patient awake    Reviewed: Allergy & Precautions, H&P , NPO status , Patient's Chart, lab work & pertinent test results  History of Anesthesia Complications Negative for: history of anesthetic complications  Airway Mallampati: II  TM Distance: >3 FB Neck ROM: full    Dental no notable dental hx.    Pulmonary neg pulmonary ROS,    Pulmonary exam normal breath sounds clear to auscultation       Cardiovascular negative cardio ROS Normal cardiovascular exam Rhythm:regular Rate:Normal     Neuro/Psych Anxiety  Neuromuscular disease    GI/Hepatic Neg liver ROS, GERD  ,  Endo/Other  negative endocrine ROS  Renal/GU negative Renal ROS     Musculoskeletal  (+) Arthritis , Fibromyalgia -  Abdominal (+) + obese,   Peds  Hematology  (+) anemia ,   Anesthesia Other Findings No blood thinning meds, coags normal  Reproductive/Obstetrics negative OB ROS                            Anesthesia Physical Anesthesia Plan  ASA: III  Anesthesia Plan: Spinal   Post-op Pain Management:    Induction: Intravenous  Airway Management Planned: Simple Face Mask  Additional Equipment:   Intra-op Plan:   Post-operative Plan:   Informed Consent: I have reviewed the patients History and Physical, chart, labs and discussed the procedure including the risks, benefits and alternatives for the proposed anesthesia with the patient or authorized representative who has indicated his/her understanding and acceptance.     Plan Discussed with: Anesthesiologist, CRNA and Surgeon  Anesthesia Plan Comments:         Anesthesia Quick Evaluation

## 2014-10-07 NOTE — Transfer of Care (Signed)
Immediate Anesthesia Transfer of Care Note  Patient: Gabrielle Benson  Procedure(s) Performed: Procedure(s): RIGHT UNI KNEE ARTHROPLASTY LATERALLY  (Right)  Patient Location: PACU  Anesthesia Type:MAC and Spinal  Level of Consciousness: awake, alert , oriented and patient cooperative  Airway & Oxygen Therapy: Patient Spontanous Breathing and Patient connected to face mask oxygen  Post-op Assessment: Report given to RN and Post -op Vital signs reviewed and stable  Post vital signs: Reviewed and stable  Last Vitals:  Filed Vitals:   10/07/14 0752  BP: 131/78  Pulse: 79  Temp: 36.9 C  Resp: 16    Complications: No apparent anesthesia complications

## 2014-10-07 NOTE — Interval H&P Note (Signed)
History and Physical Interval Note:  10/07/2014 9:00 AM  Gabrielle Benson  has presented today for surgery, with the diagnosis of right lateral compartmental oa   The various methods of treatment have been discussed with the patient and family. After consideration of risks, benefits and other options for treatment, the patient has consented to  Procedure(s): RIGHT UNI KNEE ARTHROPLASTY LATERALLY  (Right) as a surgical intervention .  The patient's history has been reviewed, patient examined, no change in status, stable for surgery.  I have reviewed the patient's chart and labs.  Questions were answered to the patient's satisfaction.     Mauri Pole

## 2014-10-07 NOTE — Anesthesia Postprocedure Evaluation (Signed)
  Anesthesia Post-op Note  Patient: Gabrielle Benson  Procedure(s) Performed: Procedure(s) (LRB): RIGHT UNI KNEE ARTHROPLASTY LATERALLY  (Right)  Patient Location: PACU  Anesthesia Type: Spinal  Level of Consciousness: awake and alert   Airway and Oxygen Therapy: Patient Spontanous Breathing  Post-op Pain: mild  Post-op Assessment: Post-op Vital signs reviewed, Patient's Cardiovascular Status Stable, Respiratory Function Stable, Patent Airway and No signs of Nausea or vomiting  Last Vitals:  Filed Vitals:   10/07/14 1157  BP: 117/71  Pulse: 76  Temp: 36.4 C  Resp: 13    Post-op Vital Signs: stable   Complications: No apparent anesthesia complications

## 2014-10-07 NOTE — Progress Notes (Signed)
PT Cancellation Note  Patient Details Name: Gabrielle Benson MRN: [id number] DOB: March 28, 1955   Cancelled Treatment:    Reason Eval/Treat Not Completed: Attempted PT eval POD 0-R LE sensation still decreased/impaired at this time. Will check back on tomorrow for PT eval. Thanks.   Weston Anna, MPT Pager: [phone]

## 2014-10-07 NOTE — Op Note (Signed)
Gabrielle Benson, Gabrielle Benson NO.:  [id number]  MEDICAL RECORD NO.:  [id number]  LOCATION:  7062                         FACILITY:  [facility]  PHYSICIAN:  Pietro Cassis. Alvan Dame, M.D.  DATE OF BIRTH:  07-10-1955  DATE OF PROCEDURE:  10/07/2014 DATE OF DISCHARGE:                              OPERATIVE REPORT   PREOPERATIVE DIAGNOSIS:  Right knee lateral compartment osteoarthritis.  POSTOPERATIVE DIAGNOSIS:  Right knee lateral compartment osteoarthritis.  PROCEDURE:  Right lateral partial knee arthroplasty utilizing a Biomet knee system with lateral Biomet Oxford with a size extra small femur size B4 femur to match this right lateral knee.  SURGEON:  Pietro Cassis. Alvan Dame, M.D.  ASSISTANT:  Danae Orleans, PA-C.  Note that Mr. Gabrielle Benson was present for the entirety of the case from preoperative position, perioperative management of operative extremity, general facilitation of the case, and primary wound closure.  ANESTHESIA:  Spinal.  SPECIMENS:  None.  DRAINS:  None.  BLOOD LOSS:  Less than 50 mL.  TOURNIQUET TIME:  48 minutes at 250 mmHg.  INDICATIONS FOR PROCEDURE:  Gabrielle Benson is a 59 year old female, who presented for evaluation of persistent lateral based pain following arthroscopic surgery, noted to have isolated lateral compartment degenerative changes.  She had failed physical therapy, cortisone injections, medications and had reduction of quality of life affecting basics of ADL as well as work related functions.  She wished at this point to proceed with advanced surgical procedure.  We discussed the possibilities of partial lateral knee arthroplasty as opposed to total knee arthroplasty as the lateral compartment was the only affected compartment.  Her anterior cruciate ligament remained intact.  We discussed the risks of infection, DVT, component failure, potential need to revise to total knee arthroplasty due to failed components or progressive degenerative  changes.  She understood these risks and wished to proceed in this fashion for the functional capabilities of a partial knee arthroplasty as opposed to total.  Consent was obtained.  PROCEDURE IN DETAIL:  The patient was brought to operative theater. Once adequate anesthesia, preop antibiotics, Ancef 2 g administered as well as 1 g of tranexamic acid and 10 mg Decadron.  She was positioned with the right thigh tourniquet placed.  Right leg was allowed to flex over the Oxford leg holder to allow for 100-120 degrees of flexion.  The right lower extremity was then prepped and draped in a sterile fashion. Time-out was performed identifying the patient, planned procedure, and extremity.  The leg was exsanguinated.  Tourniquet elevated to 250 mmHg.  A slightly lateral to midline incision was made around the proximal pole of patella to the tibial tubercle.  Soft tissue planes created.  A lateral arthrotomy was made.  An initial synovectomy over the anterior aspect of the knee as well as laterally was carried out removing the anterior half of the lateral meniscus.  With limited exposure on the proximal lateral tibia, I at this point sized the femur to be an extra-small femur.  The extramedullary guide was then positioned over the anterior aspect of the tibial spine parallel and resection of 1-2 mm of bone off the most affected lateral tibial plateau was made.  In order to do this, a longitudinal split  was made into the patella tendon and the reciprocating saw was used to perform the vertical cut on the proximal tibia.  This was done to ensure internal rotation of the tibial component  The oscillating saw was then used, the proximal tibia cut removed and examined to confirm the depth of penetration.  At this point, the knee was brought to extension without any retractors in place, and found that the size 4 Feeler gauge had appropriate tension and extension.  Given this, we then drilled a  hole into the distal femur lateral to the notch of the femur.  I then used a starting awl and then placed an intramedullary rod.  With the extra-small guide for the femoral component set on size 3, it was positioned onto the proximal lateral tibial cut and then oriented using the angled attachment to the intramedullary rod.  With this positioned over the middle aspect the lateral femoral condyle, the 2 drill holes were made. Following this, we tapped in the posterior cutting block and made the posterior femoral cut.  I then used 0 spigot, milled the distal femur. At this point, we did trial reductions with the extra small femur, the B tibial tray.  Again at this point in flexion, I found with a size 4 had an appropriate tension on the ligaments, however, in extension, I was still tight even with a size 1 spacer block in place.  Given this expected asymmetry and extension of flexion gaps point, I removed the trial components and then milled with a 3 spigot.  The distal femur bone was milled and excessive bone was removed throughout and then repeat trial reduction carried out.  Begin with extra small femur and B tibial tray, the 4 Feeler gauge still felt appropriate in flexion, however, in extension, it was still a little tight.  I was worried about excessive tightness laterally and thus selected to resect it with the 4 spigot in place.  Following this procedure, retrialed and was satisfied at this point that it has symmetric balance in extension and flexion with the size 4 Feeler gauge.  At this point, the trial components were removed.  Final tibial preparation was carried out by pinning the tibial tray in place using reciprocating saw.  I then created a trough for the keel component.  We then drilled the sclerotic bone with the drill and final components were opened and cement was mixed on the back table.  The final components were then cemented into place.  The knee was held in full  extension until cement cured and excessive cement was removed throughout the knee.  Once this was completed, we irrigated the knee again.  Tourniquet had been let down after 48 minutes.  The extensor mechanism was then reapproximated using a #1 Vicryl and 0 V-Loc suture.  The remainder of the wound was closed with 2-0 Vicryl and running 4-0 Monocryl.  The knee was then cleaned, dried, and dressed sterilely using surgical glue and Aquacel dressing.  She was then brought to recovery room in stable condition, tolerating the procedure well.     Pietro Cassis Alvan Dame, M.D.     MDO/MEDQ  D:  10/07/2014  T:  10/07/2014  Job:  [id number]

## 2014-10-07 NOTE — Brief Op Note (Signed)
10/07/2014  11:38 AM  PATIENT:  [name]  59 y.o. female  PRE-OPERATIVE DIAGNOSIS:  right lateral compartmental osteoarthritis   POST-OPERATIVE DIAGNOSIS:  right lateral compartmental osteoarthritis  PROCEDURE:  Procedure(s): RIGHT UNI KNEE ARTHROPLASTY LATERALLY  (Right)  Biomet - lateral Oxford  SURGEON:  Surgeon(s) and Role:    * Paralee Cancel, MD - Primary  PHYSICIAN ASSISTANT: Danae Orleans, PA-C  ANESTHESIA:   spinal  EBL:  Total I/O In: 1000 [I.V.:1000] Out: 100 [Blood:100]  BLOOD ADMINISTERED:none  DRAINS: none   LOCAL MEDICATIONS USED:  MARCAINE     SPECIMEN:  No Specimen  DISPOSITION OF SPECIMEN:  N/A  COUNTS:  YES  TOURNIQUET:   Total Tourniquet Time Documented: Thigh (Right) - 48 minutes Total: Thigh (Right) - 48 minutes   DICTATION: .Other Dictation: Dictation Number [id number]  PLAN OF CARE: Admit for overnight observation  PATIENT DISPOSITION:  PACU - hemodynamically stable.   Delay start of Pharmacological VTE agent (>24hrs) due to surgical blood loss or risk of bleeding: no

## 2014-10-08 DIAGNOSIS — M1711 Unilateral primary osteoarthritis, right knee: Secondary | ICD-10-CM | POA: Diagnosis not present

## 2014-10-08 LAB — BASIC METABOLIC PANEL
Anion gap: 7 (ref 5–15)
BUN: 11 mg/dL (ref 6–20)
CO2: 25 mmol/L (ref 22–32)
CREATININE: 0.53 mg/dL (ref 0.44–1.00)
Calcium: 9.7 mg/dL (ref 8.9–10.3)
Chloride: 105 mmol/L (ref 101–111)
GFR calc Af Amer: >60 mL/min (ref >60)
Glucose, Bld: 148 mg/dL — ABNORMAL HIGH (ref 65–99)
POTASSIUM: 4.1 mmol/L (ref 3.5–5.1)
SODIUM: 137 mmol/L (ref 135–145)

## 2014-10-08 LAB — CBC
HCT: 35.1 % — ABNORMAL LOW (ref 36.0–46.0)
Hemoglobin: 10.6 g/dL — ABNORMAL LOW (ref 12.0–15.0)
MCH: 26.5 pg (ref 26.0–34.0)
MCHC: 30.2 g/dL (ref 30.0–36.0)
MCV: 87.8 fL (ref 78.0–100.0)
PLATELETS: 305 10*3/uL (ref 150–400)
RBC: 4 MIL/uL (ref 3.87–5.11)
RDW: 13 % (ref 11.5–15.5)
WBC: 11.6 10*3/uL — ABNORMAL HIGH (ref 4.0–10.5)

## 2014-10-08 MED ORDER — FERROUS SULFATE 325 (65 FE) MG PO TABS: 325.0000 mg | ORAL_TABLET | Freq: Three times a day (TID) | ORAL | Status: DC

## 2014-10-08 MED ORDER — ASPIRIN 325 MG PO TBEC: 325.0000 mg | DELAYED_RELEASE_TABLET | Freq: Two times a day (BID) | ORAL | Status: AC

## 2014-10-08 MED ORDER — DOCUSATE SODIUM 100 MG PO CAPS: 100.0000 mg | ORAL_CAPSULE | Freq: Two times a day (BID) | ORAL | Status: DC

## 2014-10-08 MED ORDER — HYDROCODONE-ACETAMINOPHEN 7.5-325 MG PO TABS: 1.0000 | ORAL_TABLET | ORAL | Status: DC | PRN

## 2014-10-08 MED ORDER — POLYETHYLENE GLYCOL 3350 17 G PO PACK: 17.0000 g | PACK | Freq: Two times a day (BID) | ORAL | Status: DC

## 2014-10-08 MED ORDER — METHOCARBAMOL 500 MG PO TABS: 500.0000 mg | ORAL_TABLET | Freq: Four times a day (QID) | ORAL | Status: DC | PRN

## 2014-10-08 NOTE — Care Management Note (Signed)
Case Management Note  Patient Details  Name: Gabrielle Benson MRN: [id number] Date of Birth: Dec 20, 1955  Subjective/Objective:                   RIGHT UNI KNEE ARTHROPLASTY LATERALLY (Right) Action/Plan: Discharge planning  Expected Discharge Date:  10/08/14               Expected Discharge Plan:  [place]  In-House Referral:     Discharge planning Services  CM Consult  Post Acute Care Choice:  Home Health Choice offered to:  Patient  DME Arranged:  3-N-1, Walker rolling DME Agency:  [place]:  PT [place] Agency:  [place]  Status of Service:  Completed, signed off  Medicare Important Message Given:    Date Medicare IM Given:    Medicare IM give by:    Date Additional Medicare IM Given:    Additional Medicare Important Message give by:     If discussed at [place] of Stay Meetings, dates discussed:    Additional Comments: CM met with pt in room to offer choice of home health agency.  Pt chooses [facility] to render HHPT.  Address and contact information verified by pt.  Referral called to [employer], Tim.  CM called [facility] DME rep, Lecretia to please deliver the 3n1 and rolling walker to room prior to discharge.  No other Cm needs were communcated. Dellie Catholic, RN 10/08/2014, 1:29 PM

## 2014-10-08 NOTE — Evaluation (Addendum)
Occupational Therapy Evaluation Patient Details Name: Gabrielle Benson MRN: [id number] DOB: 06/09/1955 Today's Date: 10/08/2014    History of Present Illness 59 yo female s/p R UKR 10/07/14.   Clinical Impression   Patient admitted with above. Patient independent PTA. Patient currently functioning at an overall supervision level. D/C from acute OT services and no additional follow-up OT needs at this time. All appropriate education provided to patient and family (husband). Please re-order OT if needed.      Follow Up Recommendations  No OT follow up;Supervision - Intermittent    Equipment Recommendations  3 in 1 bedside comode    Recommendations for Other Services  None at this time   Precautions / Restrictions Precautions Precautions: Knee Restrictions Weight Bearing Restrictions: Yes RLE Weight Bearing: Weight bearing as tolerated    Mobility Bed Mobility General bed mobility comments: Pt found in recliner upon OT entering/exiting room, see PT note.   Transfers Overall transfer level: Needs assistance Equipment used: Rolling walker (2 wheeled) Transfers: Sit to/from Stand Sit to Stand: Supervision General transfer comment: Distant supervision for safety, no cueing needed.     Balance Overall balance assessment: Needs assistance Sitting-balance support: No upper extremity supported;Feet supported Sitting balance-Leahy Scale: Good     Standing balance support: Bilateral upper extremity supported;During functional activity Standing balance-Leahy Scale: Good    ADL Overall ADL's : Needs assistance/impaired General ADL Comments: Pt overall supervision>mod I with ADLs and functional mobility. Pt with complaints of chronic back pain, educated pt about AE to help decrease pain and maintain independence with LB ADLs, pt not interested at this time. Pt ambulated into BR for toilet transfer using 3-in-1 over toilet seat and walk-in shower transfer. Encouraged pt to use  3-in-1 in shower for safe showers.     Pertinent Vitals/Pain Pain Assessment: 0-10 Pain Score: 3  Pain Location: right knee Pain Descriptors / Indicators: Aching;Sore Pain Intervention(s): Limited activity within patient's tolerance;Monitored during session;Repositioned;Ice applied     Hand Dominance Right   Extremity/Trunk Assessment Upper Extremity Assessment Upper Extremity Assessment: Overall WFL for tasks assessed   Lower Extremity Assessment Lower Extremity Assessment: Defer to PT evaluation RLE Deficits / Details: strength at least 3/5-did not mmt   Cervical / Trunk Assessment Cervical / Trunk Assessment: Normal   Communication Communication Communication: No difficulties   Cognition Arousal/Alertness: Awake/alert Behavior During Therapy: WFL for tasks assessed/performed Overall Cognitive Status: Within Functional Limits for tasks assessed              Home Living Family/patient expects to be discharged to:: Private residence Living Arrangements: Spouse/significant other Available Help at Discharge: Family;Available PRN/intermittently (daughter, mother, and husband) Type of Home: House Home Access: Stairs to enter Technical brewer of Steps: 4 Entrance Stairs-Rails: Right;Left Home Layout: Two level;Bed/bath upstairs     Bathroom Shower/Tub: Occupational psychologist: Standard     Home Equipment: Crutches   Prior Functioning/Environment Level of Independence: Independent      OT Diagnosis: Generalized weakness;Acute pain   OT Problem List:  n/a, no acute OT needs identified    OT Treatment/Interventions:   n/a, no acute OT needs identified    OT Goals(Current goals can be found in the care plan section) Acute Rehab OT Goals Patient Stated Goal: go home today! OT Goal Formulation: All assessment and education complete, DC therapy  OT Frequency:   n/a, no acute OT needs identified    Barriers to D/C:  None known at this time    End  of Session Equipment Utilized During Treatment: Surveyor, mining Communication: Other (comment) (DME needs)  Activity Tolerance: Patient tolerated treatment well Patient left: in chair;with call bell/phone within reach;with family/visitor present   Time: 1007-1025 OT Time Calculation (min): 18 min Charges:  OT General Charges $OT Visit: 1 Procedure OT Evaluation $Initial OT Evaluation Tier I: 1 Procedure G-Codes: OT G-codes **NOT FOR INPATIENT CLASS** Functional Limitation: Self care Self Care Current Status (X4128): At least 1 percent but less than 20 percent impaired, limited or restricted Self Care Goal Status (N8676): At least 1 percent but less than 20 percent impaired, limited or restricted Self Care Discharge Status [phone]): At least 1 percent but less than 20 percent impaired, limited or restricted  CLAY,PATRICIA , MS, OTR/L, CLT Pager: [phone]  10/08/2014, 10:35 AM

## 2014-10-08 NOTE — Evaluation (Signed)
Physical Therapy Evaluation Patient Details Name: Mersedes Alber MRN: [id number] DOB: 04-03-1955 Today's Date: 10/08/2014   History of Present Illness  59 yo female s/p R UKR 10/07/14.  Clinical Impression  On eval, pt was supervision level for mobility-walked ~150 feet with RW. Pain rated 3/10. Reviewed and practiced LE exercises. Issued handout and instructed pt to perform exercises at least 2x/day. All education completed. Per Dr. Aurea Graff progress note, follow up therapy to be set up at his office.     Follow Up Recommendations Outpatient PT    Equipment Recommendations  Rolling walker with 5" wheels    Recommendations for Other Services OT consult     Precautions / Restrictions Precautions Precautions: Knee Restrictions Weight Bearing Restrictions: No RLE Weight Bearing: Weight bearing as tolerated      Mobility  Bed Mobility Overal bed mobility: Needs Assistance Bed Mobility: Supine to Sit     Supine to sit: Supervision        Transfers Overall transfer level: Needs assistance Equipment used: Rolling walker (2 wheeled) Transfers: Sit to/from Stand Sit to Stand: Supervision            Ambulation/Gait Ambulation/Gait assistance: Supervision Ambulation Distance (Feet): 150 Feet Assistive device: Rolling walker (2 wheeled) Gait Pattern/deviations: Step-to pattern;Step-through pattern     General Gait Details: for safety. VCs safety, sequence. slow gait speed.   Stairs Stairs: Yes Stairs assistance: Min guard Stair Management: Two rails;One rail Left;Step to pattern;Forwards Number of Stairs: 5 General stair comments: close guard for safety. VCs safety, technique, sequence.  Wheelchair Mobility    Modified Rankin (Stroke Patients Only)       Balance                                             Pertinent Vitals/Pain Pain Assessment: 0-10 Pain Score: 3  Pain Location: R knee Pain Descriptors / Indicators:  Aching;Sore Pain Intervention(s): Monitored during session;Repositioned;Ice applied    Home Living Family/patient expects to be discharged to:: Private residence Living Arrangements: Spouse/significant other   Type of Home: House Home Access: Stairs to enter Entrance Stairs-Rails: Psychiatric nurse of Steps: 4 Home Layout: Two level;Bed/bath upstairs Home Equipment: Crutches      Prior Function Level of Independence: Independent               Hand Dominance        Extremity/Trunk Assessment   Upper Extremity Assessment: Defer to OT evaluation           Lower Extremity Assessment: RLE deficits/detail RLE Deficits / Details: strength at least 3/5-did not mmt    Cervical / Trunk Assessment: Normal  Communication   Communication: No difficulties  Cognition Arousal/Alertness: Awake/alert Behavior During Therapy: WFL for tasks assessed/performed Overall Cognitive Status: Within Functional Limits for tasks assessed                      General Comments      Exercises Total Joint Exercises Ankle Circles/Pumps: AROM;Both;10 reps;Supine Quad Sets: AROM;Both;10 reps;Supine Hip ABduction/ADduction: AROM;Right;10 reps;Supine Straight Leg Raises: AROM;Right;10 reps;Supine Knee Flexion: AROM;Right;10 reps;Seated Goniometric ROM: ~5-110 degrees      Assessment/Plan    PT Assessment Patient needs continued PT services (OP PT)  PT Diagnosis Acute pain   PT Problem List Decreased strength;Decreased range of motion;Decreased balance;Decreased coordination;Pain;Decreased knowledge of use of DME  PT  Treatment Interventions DME instruction;Gait training;Functional mobility training;Therapeutic activities;Therapeutic exercise;Patient/family education;Balance training   PT Goals (Current goals can be found in the Care Plan section) Acute Rehab PT Goals Patient Stated Goal: regain independence PT Goal Formulation: All assessment and education  complete, DC therapy    Frequency 7X/week   Barriers to discharge        Co-evaluation               End of Session   Activity Tolerance: Patient tolerated treatment well Patient left: in chair;with call bell/phone within reach      Functional Assessment Tool Used: clinical judgement Functional Limitation: Mobility: Walking and moving around Mobility: Walking and Moving Around Current Status (O8325): At least 1 percent but less than 20 percent impaired, limited or restricted Mobility: Walking and Moving Around Goal Status [phone]): At least 1 percent but less than 20 percent impaired, limited or restricted Mobility: Walking and Moving Around Discharge Status [phone]): At least 1 percent but less than 20 percent impaired, limited or restricted    Time: 0850-0927 PT Time Calculation (min) (ACUTE ONLY): 37 min   Charges:   PT Evaluation $Initial PT Evaluation Tier I: 1 Procedure PT Treatments $Gait Training: 8-22 mins   PT G Codes:   PT G-Codes **NOT FOR INPATIENT CLASS** Functional Assessment Tool Used: clinical judgement Functional Limitation: Mobility: Walking and moving around Mobility: Walking and Moving Around Current Status (E9407): At least 1 percent but less than 20 percent impaired, limited or restricted Mobility: Walking and Moving Around Goal Status [phone]): At least 1 percent but less than 20 percent impaired, limited or restricted Mobility: Walking and Moving Around Discharge Status [phone]): At least 1 percent but less than 20 percent impaired, limited or restricted    Weston Anna, MPT Pager: [phone]

## 2014-10-08 NOTE — Discharge Instructions (Signed)

## 2014-10-08 NOTE — Progress Notes (Signed)
Patient ID: Gabrielle Benson, female   DOB: 1955-10-12, 59 y.o.   MRN: [id number] Subjective: 1 Day Post-Op Procedure(s) (LRB): RIGHT UNI KNEE ARTHROPLASTY LATERALLY  (Right)    Patient reports pain as mild.  No events overnight and with out of bed activity with nursing  Objective:   VITALS:   Filed Vitals:   10/08/14 0540  BP: 115/73  Pulse: 65  Temp: 98.2 F (36.8 C)  Resp: 16    Neurovascular intact Incision: dressing C/D/I  LABS  Recent Labs  10/08/14 0500  HGB 10.6*  HCT 35.1*  WBC 11.6*  PLT 305     Recent Labs  10/08/14 0500  NA 137  K 4.1  BUN 11  CREATININE 0.53  GLUCOSE 148*    No results for input(s): LABPT, INR in the last 72 hours.   Assessment/Plan: 1 Day Post-Op Procedure(s) (LRB): RIGHT UNI KNEE ARTHROPLASTY LATERALLY  (Right)   Advance diet Up with therapy Discharge home  Will set up therapy at our office to start when possible

## 2014-10-09 NOTE — Discharge Summary (Signed)
Physician Discharge Summary  Patient ID: Gabrielle Benson MRN: [id number] DOB/AGE: Dec 05, 1955 59 y.o.  Admit date: 10/07/2014 Discharge date: 10/08/2014   Procedures:  Procedure(s) (LRB): RIGHT UNI KNEE ARTHROPLASTY LATERALLY  (Right)  Attending Physician:  Dr. Paralee Cancel   Admission Diagnoses:   Right knee lateral compartmental primary OA /pain  Discharge Diagnoses:  Principal Problem:   S/P right UKR  Past Medical History  Diagnosis Date  . Fibromyalgia   . DJD (degenerative joint disease)   . Cervicalgia     Chronic  . DDD (degenerative disc disease)     Cervical  . Allergic rhinitis   . Anemia     Iron deficiency  . Hyperlipidemia   . ANA positive   . Multinodular thyroid   . Neuropathy, peripheral   . Vitamin D deficiency 03/18/2011  . Complication of anesthesia     hard to go to sleep with Anesthesia-had to have Benadryl with Colonoscopy  . GERD (gastroesophageal reflux disease)   . Pneumonia   . Anxiety     HPI:    Gabrielle Benson, 59 y.o. female female, has a history of pain and functional disability in the right and has failed non-surgical conservative treatments for greater than 12 weeks to include NSAID's and/or analgesics, corticosteriod injections, viscosupplementation injections and activity modification. Onset of symptoms was gradual, starting >10 years ago with gradually worsening course since that time. The patient noted prior procedures on the knee to include arthroscopy and menisectomy on the right knee(s). Patient currently rates pain in the right knee(s) at 10 out of 10 with activity. Patient has worsening of pain with activity and weight bearing, pain that interferes with activities of daily living, pain with passive range of motion, crepitus and joint swelling. Patient has evidence of periarticular osteophytes and joint space narrowing of the lateral compartment by imaging studies. There is no active infection. Risks, benefits and  expectations were discussed with the patient. Risks including but not limited to the risk of anesthesia, blood clots, nerve damage, blood vessel damage, failure of the prosthesis, infection and up to and including death. Patient understand the risks, benefits and expectations and wishes to proceed with surgery.   PCP: Cathlean Cower, MD   Discharged Condition: good  Hospital Course:  Patient underwent the above stated procedure on 10/07/2014. Patient tolerated the procedure well and brought to the recovery room in good condition and subsequently to the floor.  POD #1 BP: 115/73 ; Pulse: 65 ; Temp: 98.2 F (36.8 C) ; Resp: 16 Patient reports pain as mild. No events overnight and with out of bed activity with nursing. Dorsiflexion/plantar flexion intact, incision: dressing C/D/I, no cellulitis present and compartment soft.   LABS  Basename    HGB  10.6  HCT  35.1    Discharge Exam: General appearance: alert, cooperative and no distress Extremities: Homans sign is negative, no sign of DVT, no edema, redness or tenderness in the calves or thighs and no ulcers, gangrene or trophic changes  Disposition: Home with follow up in 2 weeks   Follow-up Information    Follow up with Mauri Pole, MD. Schedule an appointment as soon as possible for a visit in 2 weeks.   Specialty:  Orthopedic Surgery   Contact information:   [address] [place] [address] [phone]       Follow up with [facility].   Why:  home health physical therapy   Contact information:   [place]  [address] [place] [address] [phone]       Follow up with [facility].   Why:  rolling walker and 3n1 (over the commode seat)   Contact information:   [address] [place] [address] [phone]       Discharge Instructions    Call MD / Call 911    Complete by:  As directed   If you experience chest pain or shortness of breath, CALL 911  and be transported to the hospital emergency room.  If you develope a fever above 101 F, pus (white drainage) or increased drainage or redness at the wound, or calf pain, call your surgeon's office.     Change dressing    Complete by:  As directed   Maintain surgical dressing until follow up in the clinic. If the edges start to pull up, may reinforce with tape. If the dressing is no longer working, may remove and cover with gauze and tape, but must keep the area dry and clean.  Call with any questions or concerns.     Constipation Prevention    Complete by:  As directed   Drink plenty of fluids.  Prune juice may be helpful.  You may use a stool softener, such as Colace (over the counter) 100 mg twice a day.  Use MiraLax (over the counter) for constipation as needed.     Diet - low sodium heart healthy    Complete by:  As directed      Discharge instructions    Complete by:  As directed   Maintain surgical dressing until follow up in the clinic. If the edges start to pull up, may reinforce with tape. If the dressing is no longer working, may remove and cover with gauze and tape, but must keep the area dry and clean.  Follow up in 2 weeks at [facility] Call with any questions or concerns.     Increase activity slowly as tolerated    Complete by:  As directed   Weight bearing as tolerated with assist device (walker, cane, etc) as directed, use it as long as suggested by your surgeon or therapist, typically at least 4-6 weeks.     TED hose    Complete by:  As directed   Use stockings (TED hose) for 2 weeks on both leg(s).  You may remove them at night for sleeping.             Medication List    STOP taking these medications        albuterol 108 (90 BASE) MCG/ACT inhaler  Commonly known as:  PROVENTIL HFA;VENTOLIN HFA     fluticasone 50 MCG/ACT nasal spray  Commonly known as:  FLONASE     naproxen sodium 220 MG tablet  Commonly known as:  ANAPROX     predniSONE 10 MG  tablet  Commonly known as:  DELTASONE     valACYclovir 1000 MG tablet  Commonly known as:  VALTREX     VOLTAREN 1 % Gel  Generic drug:  diclofenac sodium      TAKE these medications        aspirin 325 MG EC tablet  Take 1 tablet (325 mg total) by mouth 2 (two) times daily.     azelastine 0.1 % nasal spray  Commonly known as:  ASTELIN  Place 1 spray into both nostrils 2 (two) times daily. Use in each nostril as directed     BIOFREEZE EX  Apply 1 application topically daily.     cetirizine 10 MG tablet  Commonly known as:  ZYRTEC  Take 1 tablet (10 mg total) by mouth daily.     docusate sodium 100 MG capsule  Commonly known as:  COLACE  Take 1 capsule (100 mg total) by mouth 2 (two) times daily.     estrogens (conjugated) 0.625 MG tablet  Commonly known as:  PREMARIN  Take 0.625 mg by mouth daily.     ferrous sulfate 325 (65 FE) MG tablet  Take 1 tablet (325 mg total) by mouth 3 (three) times daily after meals.     HYDROcodone-acetaminophen 7.5-325 MG per tablet  Commonly known as:  NORCO  Take 1-2 tablets by mouth every 4 (four) hours as needed for moderate pain.     levocetirizine 5 MG tablet  Commonly known as:  XYZAL  Take 5 mg by mouth every evening.     methocarbamol 500 MG tablet  Commonly known as:  ROBAXIN  Take 1 tablet (500 mg total) by mouth every 6 (six) hours as needed for muscle spasms.     montelukast 10 MG tablet  Commonly known as:  SINGULAIR  Take 1 tablet (10 mg total) by mouth daily.     pantoprazole 40 MG tablet  Commonly known as:  PROTONIX  Take 1 tablet (40 mg total) by mouth daily.     polyethylene glycol packet  Commonly known as:  MIRALAX / GLYCOLAX  Take 17 g by mouth 2 (two) times daily.     pregabalin 150 MG capsule  Commonly known as:  LYRICA  Take 150 mg by mouth 3 (three) times daily.         Signed: West Pugh. Babish   PA-C  10/09/2014, 9:12 AM

## 2014-10-28 ENCOUNTER — Encounter (HOSPITAL_COMMUNITY): Payer: Self-pay | Admitting: Emergency Medicine

## 2014-10-28 ENCOUNTER — Emergency Department (INDEPENDENT_AMBULATORY_CARE_PROVIDER_SITE_OTHER)
Admission: EM | Admit: 2014-10-28 | Discharge: 2014-10-28 | Disposition: A | Discharge status: Home / Self Care | Payer: 59 | Source: Home / Self Care

## 2014-10-28 DIAGNOSIS — T7840XA Allergy, unspecified, initial encounter: Secondary | ICD-10-CM

## 2014-10-28 DIAGNOSIS — R21 Rash and other nonspecific skin eruption: Secondary | ICD-10-CM | POA: Diagnosis not present

## 2014-10-28 NOTE — Discharge Instructions (Signed)
Rest, ice, elevate. Take Benadryl 50 mg every 6 hours. Call your Orthopedist for recheck of right knee tomorrow. Go immediately to Er if you develop shortness of breath, fever, red streaking, worsening rash, etc.

## 2014-10-28 NOTE — ED Notes (Signed)
Pt has a rash on her right upper and lower leg, that look like tiny water blisters.  Pt had knee replacement surgery three weeks ago and she states that sometimes, when she is more immunocompromised, she will get a rash.

## 2014-10-28 NOTE — ED Provider Notes (Signed)
CSN: [id number]     Arrival date & time 10/28/14  1418 History   None    Chief Complaint  Patient presents with  . Rash   (Consider location/radiation/quality/duration/timing/severity/associated sxs/prior Treatment) Patient is a 59 y.o. female presenting with rash. The history is provided by the patient. No language interpreter was used.  Rash Location:  Leg (right knee) Leg rash location:  R lower leg and R knee Quality: itchiness and redness   Severity:  Moderate Onset quality:  Gradual Timing:  Constant Progression:  Unchanged Chronicity:  New Context comment:  Pt recently had right knee replacement last month, noticed slight rash Friday night, now rash is all on right leg, mostly around right knee Relieved by:  Antihistamines and topical steroids Worsened by:  Heat Associated symptoms: joint pain   Associated symptoms: no abdominal pain, no fever, no headaches, no nausea, no sore throat, no throat swelling, no tongue swelling and no URI     Past Medical History  Diagnosis Date  . Fibromyalgia   . DJD (degenerative joint disease)   . Cervicalgia     Chronic  . DDD (degenerative disc disease)     Cervical  . Allergic rhinitis   . Anemia     Iron deficiency  . Hyperlipidemia   . ANA positive   . Multinodular thyroid   . Neuropathy, peripheral ([place])   . Vitamin D deficiency 03/18/2011  . Complication of anesthesia     hard to go to sleep with Anesthesia-had to have Benadryl with Colonoscopy  . GERD (gastroesophageal reflux disease)   . Pneumonia   . Anxiety    Past Surgical History  Procedure Laterality Date  . Abdominal hysterectomy    . Abdominal hysterectomy      partial  . Tonsillectomy      age 83  . Dilation and curettage of uterus    . Bunionectomy  2011    left foot  . Knee arthroscopy  2004    right  . Myomectomy  2002    prior to hysterectomy  . Partial knee arthroplasty Right 10/07/2014    Procedure: RIGHT UNI KNEE ARTHROPLASTY LATERALLY ;   Surgeon: Paralee Cancel, MD;  Location: [facility];  Service: Orthopedics;  Laterality: Right;   Family History  Problem Relation Age of Onset  . Heart disease Father   . Sickle cell trait    . Lung cancer    . Diabetes Mother   . Colon cancer      Aunt  . Stomach cancer Neg Hx   . Rectal cancer Neg Hx   . Esophageal cancer Neg Hx    Social History  Substance Use Topics  . Smoking status: Never Smoker   . Smokeless tobacco: Never Used  . Alcohol Use: 0.0 oz/week    3-4 Glasses of wine per week   OB History    No data available     Review of Systems  Constitutional: Negative for fever.  HENT: Negative for sore throat.   Gastrointestinal: Negative for nausea and abdominal pain.  Musculoskeletal: Positive for arthralgias.  Skin: Positive for rash.  Neurological: Negative for headaches.  All other systems reviewed and are negative.   Allergies  Duloxetine; Zolpidem tartrate; and Penicillins  Home Medications   Prior to Admission medications   Medication Sig Start Date End Date Taking? Authorizing Provider  aspirin EC 325 MG EC tablet Take 1 tablet (325 mg total) by mouth 2 (two) times daily. 10/08/14 11/05/14 Yes Danae Orleans,  PA-C  diphenhydrAMINE (BENADRYL) 25 mg capsule Take 50 mg by mouth every 6 (six) hours as needed for itching.   Yes Historical Provider, MD  azelastine (ASTELIN) 0.1 % nasal spray Place 1 spray into both nostrils 2 (two) times daily. Use in each nostril as directed    Historical Provider, MD  cetirizine (ZYRTEC) 10 MG tablet Take 1 tablet (10 mg total) by mouth daily. 04/19/14   [name], MD  docusate sodium (COLACE) 100 MG capsule Take 1 capsule (100 mg total) by mouth 2 (two) times daily. 10/08/14   Danae Orleans, PA-C  estrogens, conjugated, (PREMARIN) 0.625 MG tablet Take 0.625 mg by mouth daily.    Historical Provider, MD  ferrous sulfate 325 (65 FE) MG tablet Take 1 tablet (325 mg total) by mouth 3 (three) times daily after meals. 10/08/14    Danae Orleans, PA-C  HYDROcodone-acetaminophen (NORCO) 7.5-325 MG per tablet Take 1-2 tablets by mouth every 4 (four) hours as needed for moderate pain. 10/08/14   Danae Orleans, PA-C  levocetirizine (XYZAL) 5 MG tablet Take 5 mg by mouth every evening.    Historical Provider, MD  Menthol, Topical Analgesic, (BIOFREEZE EX) Apply 1 application topically daily.    Historical Provider, MD  methocarbamol (ROBAXIN) 500 MG tablet Take 1 tablet (500 mg total) by mouth every 6 (six) hours as needed for muscle spasms. 10/08/14   Danae Orleans, PA-C  montelukast (SINGULAIR) 10 MG tablet Take 1 tablet (10 mg total) by mouth daily. 04/19/14   [name], MD  pantoprazole (PROTONIX) 40 MG tablet Take 1 tablet (40 mg total) by mouth daily. 08/13/14   [name], MD  polyethylene glycol [facility] Floria Raveling) packet Take 17 g by mouth 2 (two) times daily. 10/08/14   Danae Orleans, PA-C  pregabalin (LYRICA) 150 MG capsule Take 150 mg by mouth 3 (three) times daily.    Historical Provider, MD   Meds Ordered and Administered this Visit  Medications - No data to display  BP 166/84 mmHg  Pulse 71  Temp(Src) 97.5 F (36.4 C) (Oral)  Resp 12  SpO2 100% No data found.   Physical Exam  Constitutional: She is oriented to person, place, and time. She appears well-developed and well-nourished. She is active and cooperative. No distress.  Afebrile  HENT:  Head: Normocephalic.  Eyes: Pupils are equal, round, and reactive to light.  Neck: Normal range of motion.  Cardiovascular: Normal rate.   Pulmonary/Chest: Effort normal.  Musculoskeletal: Normal range of motion. She exhibits no edema or tenderness.  Lymphadenopathy:    She has no cervical adenopathy.  Neurological: She is alert and oriented to person, place, and time. No cranial nerve deficit or sensory deficit. GCS eye subscore is 4. GCS verbal subscore is 5. GCS motor subscore is 6.  Skin: Skin is warm and dry. Rash noted. No purpura noted. Rash is  papular and vesicular. Rash is not pustular.     Rash self limited to right knee/right leg. Erythematous, no warmth, + itching  Psychiatric: She has a normal mood and affect. Her speech is normal and behavior is normal.  Nursing note and vitals reviewed.   ED Course  Procedures (including critical care time)  Labs Review Labs Reviewed - No data to display  Imaging Review No results found.     MDM   1. Allergic reaction, initial encounter   2. Rash     Call your Orthopedic surgeon today for f/u appt. If you worsen over  night (fever, purulent drainage, streaking, etc) go immediately to ER. May take OTC Benadryl 50mg  every 6 hours for itching. Rest,ice,elevate. Pt verbalized understanding to this provider.   Tori Milks, NP 12/45/80 9983

## 2015-02-04 ENCOUNTER — Encounter: Payer: Self-pay | Admitting: Cardiology

## 2015-02-09 ENCOUNTER — Encounter (HOSPITAL_COMMUNITY): Payer: Self-pay | Admitting: Emergency Medicine

## 2015-02-09 ENCOUNTER — Emergency Department (INDEPENDENT_AMBULATORY_CARE_PROVIDER_SITE_OTHER)
Admission: EM | Admit: 2015-02-09 | Discharge: 2015-02-09 | Disposition: A | Discharge status: Home / Self Care | Payer: 59 | Source: Home / Self Care | Attending: Family Medicine | Admitting: Family Medicine

## 2015-02-09 DIAGNOSIS — J40 Bronchitis, not specified as acute or chronic: Secondary | ICD-10-CM

## 2015-02-09 MED ORDER — AZITHROMYCIN 250 MG PO TABS: ORAL_TABLET | ORAL | Status: DC

## 2015-02-09 MED ORDER — HYDROCOD POLST-CPM POLST ER 10-8 MG/5ML PO SUER: 5.0000 mL | Freq: Every evening | ORAL | Status: DC | PRN

## 2015-02-09 NOTE — Discharge Instructions (Signed)

## 2015-02-09 NOTE — ED Notes (Signed)
Here with cough x 1 week Taking otc cough medicine

## 2015-02-09 NOTE — ED Provider Notes (Signed)
CSN: [id number]     Arrival date & time 02/09/15  1318 History   None    No chief complaint on file.  (Consider location/radiation/quality/duration/timing/severity/associated sxs/prior Treatment) HPI History obtained from patient: Cough, sneezing for 2 weeks. Sputum (green/yellow). OTC meds without relief    Past Medical History  Diagnosis Date  . Fibromyalgia   . DJD (degenerative joint disease)   . Cervicalgia     Chronic  . DDD (degenerative disc disease)     Cervical  . Allergic rhinitis   . Anemia     Iron deficiency  . Hyperlipidemia   . ANA positive   . Multinodular thyroid   . Neuropathy, peripheral ([place])   . Vitamin D deficiency 03/18/2011  . Complication of anesthesia     hard to go to sleep with Anesthesia-had to have Benadryl with Colonoscopy  . GERD (gastroesophageal reflux disease)   . Pneumonia   . Anxiety    Past Surgical History  Procedure Laterality Date  . Abdominal hysterectomy    . Abdominal hysterectomy      partial  . Tonsillectomy      age 84  . Dilation and curettage of uterus    . Bunionectomy  2011    left foot  . Knee arthroscopy  2004    right  . Myomectomy  2002    prior to hysterectomy  . Partial knee arthroplasty Right 10/07/2014    Procedure: RIGHT UNI KNEE ARTHROPLASTY LATERALLY ;  Surgeon: Paralee Cancel, MD;  Location: [facility];  Service: Orthopedics;  Laterality: Right;   Family History  Problem Relation Age of Onset  . Heart disease Father   . Sickle cell trait    . Lung cancer    . Diabetes Mother   . Colon cancer      Aunt  . Stomach cancer Neg Hx   . Rectal cancer Neg Hx   . Esophageal cancer Neg Hx    Social History  Substance Use Topics  . Smoking status: Never Smoker   . Smokeless tobacco: Never Used  . Alcohol Use: 0.0 oz/week    3-4 Glasses of wine per week   OB History    No data available     Review of Systems ROS +'ve cough, cold symptoms  Denies: HEADACHE, NAUSEA, ABDOMINAL PAIN, CHEST PAIN,  CONGESTION, DYSURIA, SHORTNESS OF BREATH  Allergies  Duloxetine; Zolpidem tartrate; and Penicillins  Home Medications   Prior to Admission medications   Medication Sig Start Date End Date Taking? Authorizing Provider  azelastine (ASTELIN) 0.1 % nasal spray Place 1 spray into both nostrils 2 (two) times daily. Use in each nostril as directed    Historical Provider, MD  cetirizine (ZYRTEC) 10 MG tablet Take 1 tablet (10 mg total) by mouth daily. 04/19/14   [name], MD  diphenhydrAMINE (BENADRYL) 25 mg capsule Take 50 mg by mouth every 6 (six) hours as needed for itching.    Historical Provider, MD  docusate sodium (COLACE) 100 MG capsule Take 1 capsule (100 mg total) by mouth 2 (two) times daily. 10/08/14   Danae Orleans, PA-C  estrogens, conjugated, (PREMARIN) 0.625 MG tablet Take 0.625 mg by mouth daily.    Historical Provider, MD  ferrous sulfate 325 (65 FE) MG tablet Take 1 tablet (325 mg total) by mouth 3 (three) times daily after meals. 10/08/14   Danae Orleans, PA-C  HYDROcodone-acetaminophen (NORCO) 7.5-325 MG per tablet Take 1-2 tablets by mouth every 4 (four) hours as needed for  moderate pain. 10/08/14   Danae Orleans, PA-C  levocetirizine (XYZAL) 5 MG tablet Take 5 mg by mouth every evening.    Historical Provider, MD  Menthol, Topical Analgesic, (BIOFREEZE EX) Apply 1 application topically daily.    Historical Provider, MD  methocarbamol (ROBAXIN) 500 MG tablet Take 1 tablet (500 mg total) by mouth every 6 (six) hours as needed for muscle spasms. 10/08/14   Danae Orleans, PA-C  montelukast (SINGULAIR) 10 MG tablet Take 1 tablet (10 mg total) by mouth daily. 04/19/14   [name], MD  pantoprazole (PROTONIX) 40 MG tablet Take 1 tablet (40 mg total) by mouth daily. 08/13/14   [name], MD  polyethylene glycol [facility] Floria Raveling) packet Take 17 g by mouth 2 (two) times daily. 10/08/14   Danae Orleans, PA-C  pregabalin (LYRICA) 150 MG capsule Take 150 mg by mouth 3 (three) times  daily.    Historical Provider, MD   Meds Ordered and Administered this Visit  Medications - No data to display  BP 147/93 mmHg  Pulse 71  Temp(Src) 98.6 F (37 C) (Oral)  Resp 18  SpO2 100% No data found.   Physical Exam  Constitutional: She is oriented to person, place, and time. She appears well-developed and well-nourished.  Eyes: Conjunctivae are normal.  Neck: Normal range of motion. Neck supple.  Pulmonary/Chest: Effort normal and breath sounds normal.  Musculoskeletal: Normal range of motion.  Neurological: She is alert and oriented to person, place, and time.  Skin: Skin is warm and dry.  Psychiatric: She has a normal mood and affect. Her behavior is normal. Judgment and thought content normal.  Nursing note and vitals reviewed.   ED Course  Procedures (including critical care time)  Labs Review Labs Reviewed - No data to display  Imaging Review No results found.   Visual Acuity Review  Right Eye Distance:   Left Eye Distance:   Bilateral Distance:    Right Eye Near:   Left Eye Near:    Bilateral Near:         MDM  No diagnosis found. Patient is advised to continue home symptomatic treatment. Prescription for zpak, tussionex  sent pharmacy patient has indicated. Patient is advised that if there are new or worsening symptoms or attend the emergency department, or contact primary care provider. Instructions of care provided discharged home in stable condition.  THIS NOTE WAS GENERATED USING A VOICE RECOGNITION SOFTWARE PROGRAM. ALL REASONABLE EFFORTS  WERE MADE TO PROOFREAD THIS DOCUMENT FOR ACCURACY.     Konrad Felix, [place] 02/09/15 1525

## 2015-04-01 ENCOUNTER — Ambulatory Visit (INDEPENDENT_AMBULATORY_CARE_PROVIDER_SITE_OTHER): Payer: 59 | Admitting: Internal Medicine

## 2015-04-01 ENCOUNTER — Encounter: Payer: Self-pay | Admitting: Internal Medicine

## 2015-04-01 VITALS — BP 118/68 | HR 102 | Temp 98.2°F | Resp 20 | Wt 187.0 lb

## 2015-04-01 DIAGNOSIS — J309 Allergic rhinitis, unspecified: Secondary | ICD-10-CM

## 2015-04-01 DIAGNOSIS — R413 Other amnesia: Secondary | ICD-10-CM | POA: Diagnosis not present

## 2015-04-01 DIAGNOSIS — F419 Anxiety disorder, unspecified: Secondary | ICD-10-CM | POA: Diagnosis not present

## 2015-04-01 MED ORDER — PREDNISONE 10 MG PO TABS: ORAL_TABLET | ORAL | Status: DC

## 2015-04-01 NOTE — Progress Notes (Signed)
Subjective:    Patient ID: Gabrielle Benson, female    DOB: [birth year], 60 y.o.   MRN: [id number]  HPI  Here to f/u; overall doing ok,  Pt denies chest pain, increasing sob or doe, wheezing, orthopnea, PND, increased LE swelling, palpitations, dizziness or syncope.  Pt denies new neurological symptoms such as new headache, or facial or extremity weakness or numbness.  Pt denies polydipsia, polyuria, or low sugar episode.   Pt denies new neurological symptoms such as new headache, or facial or extremity weakness or numbness.   Pt states overall good compliance with meds,.  Does have several wks ongoing nasal allergy symptoms with clearish congestion, itch and sneezing, without fever, pain, ST, cough, swelling or wheezing. Denies worsening depressive symptoms, suicidal ideation, or panic; has ongoing anxiety, talks today without the ability to stop and discuss, and c/o memory forgetfulness worse in the last yr.  Lost her job last yr but fortunately has a new position, though quite bothered at less responsibility than she had with last job. Past Medical History  Diagnosis Date  . Fibromyalgia   . DJD (degenerative joint disease)   . Cervicalgia     Chronic  . DDD (degenerative disc disease)     Cervical  . Allergic rhinitis   . Anemia     Iron deficiency  . Hyperlipidemia   . ANA positive   . Multinodular thyroid   . Neuropathy, peripheral ([place])   . Vitamin D deficiency 03/18/2011  . Complication of anesthesia     hard to go to sleep with Anesthesia-had to have Benadryl with Colonoscopy  . GERD (gastroesophageal reflux disease)   . Pneumonia   . Anxiety    Past Surgical History  Procedure Laterality Date  . Abdominal hysterectomy    . Abdominal hysterectomy      partial  . Tonsillectomy      age 89  . Dilation and curettage of uterus    . Bunionectomy  2011    left foot  . Knee arthroscopy  2004    right  . Myomectomy  2002    prior to hysterectomy  . Partial knee  arthroplasty Right 10/07/2014    Procedure: RIGHT UNI KNEE ARTHROPLASTY LATERALLY ;  Surgeon: Paralee Cancel, MD;  Location: [facility];  Service: Orthopedics;  Laterality: Right;    reports that she has never smoked. She has never used smokeless tobacco. She reports that she drinks alcohol. She reports that she does not use illicit drugs. family history includes Diabetes in her mother; Heart disease in her father. There is no history of Stomach cancer, Rectal cancer, or Esophageal cancer. Allergies  Allergen Reactions  . Duloxetine     REACTION: dizzy, nervous  . Zolpidem Tartrate     REACTION: memory problem  . Penicillins     Has patient had a PCN reaction causing immediate rash, facial/tongue/throat swelling, SOB or lightheadedness with hypotension: No Has patient had a PCN reaction causing severe rash involving mucus membranes or skin necrosis: No Has patient had a PCN reaction that required hospitalization: No Has patient had a PCN reaction occurring within the last 10 years: No If all of the above answers are "NO", then may proceed with Cephalosporin use.     Current Outpatient Prescriptions on File Prior to Visit  Medication Sig Dispense Refill  . azelastine (ASTELIN) 0.1 % nasal spray Place 1 spray into both nostrils 2 (two) times daily. Use in each nostril as directed    . azithromycin (  ZITHROMAX Z-PAK) 250 MG tablet 2 tabs day 1 then 1 tablet day 2-5 6 tablet 0  . cetirizine (ZYRTEC) 10 MG tablet Take 1 tablet (10 mg total) by mouth daily. 30 tablet 11  . chlorpheniramine-HYDROcodone (TUSSIONEX PENNKINETIC ER) 10-8 MG/5ML SUER Take 5 mLs by mouth at bedtime as needed for cough. 140 mL 0  . diphenhydrAMINE (BENADRYL) 25 mg capsule Take 50 mg by mouth every 6 (six) hours as needed for itching.    . docusate sodium (COLACE) 100 MG capsule Take 1 capsule (100 mg total) by mouth 2 (two) times daily. 10 capsule 0  . estrogens, conjugated, (PREMARIN) 0.625 MG tablet Take 0.625 mg by mouth  daily.    . ferrous sulfate 325 (65 FE) MG tablet Take 1 tablet (325 mg total) by mouth 3 (three) times daily after meals.  3  . HYDROcodone-acetaminophen (NORCO) 7.5-325 MG per tablet Take 1-2 tablets by mouth every 4 (four) hours as needed for moderate pain. 100 tablet 0  . levocetirizine (XYZAL) 5 MG tablet Take 5 mg by mouth every evening.    . Menthol, Topical Analgesic, (BIOFREEZE EX) Apply 1 application topically daily.    . methocarbamol (ROBAXIN) 500 MG tablet Take 1 tablet (500 mg total) by mouth every 6 (six) hours as needed for muscle spasms. 50 tablet 0  . montelukast (SINGULAIR) 10 MG tablet Take 1 tablet (10 mg total) by mouth daily. 30 tablet 11  . pantoprazole (PROTONIX) 40 MG tablet Take 1 tablet (40 mg total) by mouth daily. 90 tablet 3  . polyethylene glycol (MIRALAX / GLYCOLAX) packet Take 17 g by mouth 2 (two) times daily. 14 each 0  . pregabalin (LYRICA) 150 MG capsule Take 150 mg by mouth 3 (three) times daily.     No current facility-administered medications on file prior to visit.   Review of Systems  Constitutional: Negative for unusual diaphoresis or night sweats HENT: Negative for ringing in ear or discharge Eyes: Negative for double vision or worsening visual disturbance.  Respiratory: Negative for choking and stridor.   Gastrointestinal: Negative for vomiting or other signifcant bowel change Genitourinary: Negative for hematuria or change in urine volume.  Musculoskeletal: Negative for other MSK pain or swelling Skin: Negative for color change and worsening wound.  Neurological: Negative for tremors and numbness other than noted  Psychiatric/Behavioral: Negative for decreased concentration or agitation other than above       Objective:   Physical Exam BP 118/68 mmHg  Pulse 102  Temp(Src) 98.2 F (36.8 C) (Oral)  Resp 20  Wt 187 lb (84.823 kg)  SpO2 98% VS noted, not ill appearing Constitutional: Pt appears in no significant distress HENT: Head:  NCAT.  Right Ear: External ear normal.  Left Ear: External ear normal.  Bilat tm's with mild erythema.  Max sinus areas non tender.  Pharynx with mild erythema, no exudate Eyes: . Pupils are equal, round, and reactive to light. Conjunctivae and EOM are normal Neck: Normal range of motion. Neck supple.  Cardiovascular: Normal rate and regular rhythm.   Pulmonary/Chest: Effort normal and breath sounds without rales or wheezing.  Neurological: Pt is alert. Not confused , motor 5/5 intact Skin: Skin is warm. No rash, no LE edema Psychiatric: Pt behavior is normal. No agitation. 2+ nervous    Assessment & Plan:

## 2015-04-01 NOTE — Assessment & Plan Note (Signed)
Mild to mod seasonal flare, for depomedrol IM, predpac asd, otc zyrtec/flonase asd,  to f/u any worsening symptoms or concerns

## 2015-04-01 NOTE — Assessment & Plan Note (Signed)
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

## 2015-04-01 NOTE — Patient Instructions (Signed)
You had the steroid shot today  Please take all new medication as prescribed - the prednisone  Please continue all other medications as before, and refills have been done if requested.  Please have the pharmacy call with any other refills you may need  Please keep your appointments with your specialists as you may have planned     

## 2015-04-01 NOTE — Progress Notes (Signed)
Pre visit review using our clinic review tool, if applicable. No additional management support is needed unless otherwise documented below in the visit note. 

## 2015-04-01 NOTE — Assessment & Plan Note (Signed)
mod, reassured likely situational, declines further tx, to f/u any worsening symptoms or concerns

## 2015-08-22 ENCOUNTER — Other Ambulatory Visit: Payer: Self-pay | Admitting: Internal Medicine

## 2015-08-29 ENCOUNTER — Ambulatory Visit: Payer: 59 | Admitting: Family

## 2015-09-04 ENCOUNTER — Encounter: Payer: 59 | Admitting: Internal Medicine

## 2015-09-13 ENCOUNTER — Encounter (HOSPITAL_COMMUNITY): Payer: Self-pay | Admitting: Emergency Medicine

## 2015-09-13 ENCOUNTER — Emergency Department (HOSPITAL_COMMUNITY)
Admission: EM | Admit: 2015-09-13 | Discharge: 2015-09-13 | Disposition: A | Discharge status: Home / Self Care | Payer: 59 | Attending: Physician Assistant | Admitting: Physician Assistant

## 2015-09-13 DIAGNOSIS — Z96651 Presence of right artificial knee joint: Secondary | ICD-10-CM | POA: Insufficient documentation

## 2015-09-13 DIAGNOSIS — M419 Scoliosis, unspecified: Secondary | ICD-10-CM | POA: Diagnosis not present

## 2015-09-13 DIAGNOSIS — M5441 Lumbago with sciatica, right side: Secondary | ICD-10-CM

## 2015-09-13 DIAGNOSIS — M545 Low back pain: Secondary | ICD-10-CM | POA: Diagnosis present

## 2015-09-13 DIAGNOSIS — M5442 Lumbago with sciatica, left side: Secondary | ICD-10-CM

## 2015-09-13 MED ORDER — KETOROLAC TROMETHAMINE 60 MG/2ML IM SOLN
60.0000 mg | Freq: Once | INTRAMUSCULAR | Status: AC
  Administered 2015-09-13: 60 mg via INTRAMUSCULAR
  Filled 2015-09-13: qty 2

## 2015-09-13 MED ORDER — DIAZEPAM 5 MG PO TABS: 5.0000 mg | ORAL_TABLET | Freq: Three times a day (TID) | ORAL | 0 refills | Status: DC | PRN

## 2015-09-13 MED ORDER — DIAZEPAM 5 MG PO TABS
5.0000 mg | ORAL_TABLET | Freq: Once | ORAL | Status: AC
  Administered 2015-09-13: 5 mg via ORAL
  Filled 2015-09-13: qty 1

## 2015-09-13 MED ORDER — HYDROMORPHONE HCL 1 MG/ML IJ SOLN
1.0000 mg | Freq: Once | INTRAMUSCULAR | Status: AC
  Administered 2015-09-13: 1 mg via INTRAMUSCULAR
  Filled 2015-09-13: qty 1

## 2015-09-13 MED ORDER — OXYCODONE-ACETAMINOPHEN 5-325 MG PO TABS: 1.0000 | ORAL_TABLET | ORAL | 0 refills | Status: DC | PRN

## 2015-09-13 NOTE — ED Provider Notes (Signed)
8:19 AM Handoff from [place] PA-C at 0700.   Patient with history of chronic low back pain due to scoliosis presents with flare of her typical symptoms. No red flags. Patient treated in emergency department with 1 mg IM Dilaudid, 5 mg by mouth Valium with some improvement. Patient continued to have some pain when I examined her. IM Toradol 60 mg added.   Will discharge to home with oral Valium to temporarily replace her Robaxin. Also given oral pain medications. (Rx written by Henrietta Hoover). Patient to follow-up with her orthopedist, Dr. Rolena Infante, as soon as possible for recheck.  BP 142/88   Pulse 61   Temp 98.1 F (36.7 C) (Oral)   Resp 16   Ht 5\' 3"  (1.6 m)   Wt 84.4 kg   SpO2 100%   BMI 32.95 kg/m     Carlisle Cater, PA-C 09/13/15 [id number]    Forde Dandy, MD 09/14/15 [phone]

## 2015-09-13 NOTE — ED Triage Notes (Signed)
Had cortisone injections on the 17th at her doctor's office.

## 2015-09-13 NOTE — ED Triage Notes (Signed)
Pt states she has a history of scoliosis and has been having a flare up of severe back pain after exertion helping her mother move on the 11th.  States as of yesterday morning it got even worse and became so severe she could not tolerate it anymore.  Has been taking Robaxin, Lyrica, and oxycodone without relief.  Also has tried Aleve.    Pain radiates from the lumbar region down her left leg.

## 2015-09-14 ENCOUNTER — Emergency Department (HOSPITAL_COMMUNITY)
Admission: EM | Admit: 2015-09-14 | Discharge: 2015-09-14 | Disposition: A | Discharge status: Home / Self Care | Payer: 59 | Attending: Emergency Medicine | Admitting: Emergency Medicine

## 2015-09-14 ENCOUNTER — Encounter (HOSPITAL_COMMUNITY): Payer: Self-pay | Admitting: Emergency Medicine

## 2015-09-14 ENCOUNTER — Emergency Department (HOSPITAL_COMMUNITY): Payer: 59

## 2015-09-14 DIAGNOSIS — M545 Low back pain, unspecified: Secondary | ICD-10-CM

## 2015-09-14 DIAGNOSIS — M5442 Lumbago with sciatica, left side: Secondary | ICD-10-CM | POA: Insufficient documentation

## 2015-09-14 DIAGNOSIS — X501XXA Overexertion from prolonged static or awkward postures, initial encounter: Secondary | ICD-10-CM | POA: Diagnosis not present

## 2015-09-14 DIAGNOSIS — Y92000 Kitchen of unspecified non-institutional (private) residence as  the place of occurrence of the external cause: Secondary | ICD-10-CM | POA: Diagnosis not present

## 2015-09-14 DIAGNOSIS — Z96651 Presence of right artificial knee joint: Secondary | ICD-10-CM | POA: Diagnosis not present

## 2015-09-14 DIAGNOSIS — Y939 Activity, unspecified: Secondary | ICD-10-CM | POA: Insufficient documentation

## 2015-09-14 DIAGNOSIS — Y999 Unspecified external cause status: Secondary | ICD-10-CM | POA: Diagnosis not present

## 2015-09-14 DIAGNOSIS — M549 Dorsalgia, unspecified: Secondary | ICD-10-CM | POA: Diagnosis present

## 2015-09-14 MED ORDER — HYDROMORPHONE HCL 1 MG/ML IJ SOLN
1.0000 mg | Freq: Once | INTRAMUSCULAR | Status: AC
  Administered 2015-09-14: 1 mg via INTRAVENOUS
  Filled 2015-09-14: qty 1

## 2015-09-14 MED ORDER — HYDROMORPHONE HCL 1 MG/ML IJ SOLN: 1.0000 mg | Freq: Once | INTRAMUSCULAR | Status: DC

## 2015-09-14 MED ORDER — HYDROMORPHONE HCL 1 MG/ML IJ SOLN
1.0000 mg | Freq: Once | INTRAMUSCULAR | Status: AC
  Administered 2015-09-14: 1 mg via INTRAMUSCULAR
  Filled 2015-09-14: qty 1

## 2015-09-14 NOTE — ED Notes (Signed)
Pt assisted to bathroom by means of wheelchair. Pt placed next to toilet, but pt stated she needed to be turned around in order to "use the good leg"/right leg to stand up. Pt was able to stand on her own, but expressed pain in her lower back down into L leg. Pt was able to sit down on her own. Pt checked on 5 minutes later and found to be standing, bearing weight with both legs. Pt stated she was unable to void. Pt checked on again 5 minutes later and standing on her own after voiding. Pt taken back to room by means of wheelchair.

## 2015-09-14 NOTE — ED Notes (Signed)
Pt lying on stretcher watching tv. Gave pt phone in room so may call spouse to advise ready for d/c as requested.

## 2015-09-14 NOTE — Discharge Instructions (Signed)
Please read attached information. If you experience any new or worsening signs or symptoms please return to the emergency room for evaluation. Please follow-up with your primary care provider or specialist as discussed. Please use medication prescribed only as directed and discontinue taking if you have any concerning signs or symptoms.   °

## 2015-09-14 NOTE — ED Notes (Signed)
Pt being escorted to bathroom by Danise Mina, NT.

## 2015-09-14 NOTE — ED Notes (Signed)
Pt being transported to MRI. Spouse at bedside.

## 2015-09-14 NOTE — ED Notes (Addendum)
Spouse returned andis in agreement w/tx plan. Pt assisted to w/c by staff x 2 - pt noted w/improvement from bed to w/c. Spouse stated "looks like you're moving easier". Signature pad not working.

## 2015-09-14 NOTE — ED Notes (Signed)
Spouse taking pt's purse to the vehicle and states will return.

## 2015-09-14 NOTE — ED Notes (Addendum)
Offered crutches for pt d/t states she has difficult placing weight on left leg. Pt states she has some at home and "they got them out for me this morning". Pt voiced understanding to advise staff when spouse returns.

## 2015-09-14 NOTE — ED Notes (Signed)
Pt and spouse in agreement w/tx plan - MRI this evening and then d/c.

## 2015-09-14 NOTE — ED Notes (Signed)
States pain med "helping with my back a little". Continues to rate 10/10 - states "helps in some areas but hurts in others".

## 2015-09-14 NOTE — ED Notes (Signed)
J Hedges, PA, in w/pt and spouse.

## 2015-09-14 NOTE — ED Notes (Signed)
A Harris, PA, in w/pt - pt advised she was unable to have MRI performed - states not able to lie flat. Pt in agreement w/tx plan to be d/c'd to home. Waiting on spouse to return. Pt states she had an appt w/Dr Rolena Infante yesterday am and had to call them to advise she was in ED instead. States she will resch the appt.

## 2015-09-14 NOTE — ED Notes (Signed)
Attempted to d/c pt - pt now c/o pain is worsening to left leg/foot. States she is not able to sit up and get dressed at this time. Pt and spouse requesting for PA to re-assess.

## 2015-09-14 NOTE — ED Provider Notes (Signed)
[facility] DEPT Provider Note   CSN: [id number] Arrival date & time: 09/14/15  1154  By signing my name below, I, Judithe Modest, attest that this documentation has been prepared under the direction and in the presence of Okey Regal. Electronically Signed: Judithe Modest, ER Scribe. 08/30/2015. 2:07 PM.    History   Chief Complaint Chief Complaint  Patient presents with  . Back Pain    The history is provided by the patient and the spouse. No language interpreter was used.   HPI Comments: Gabrielle Benson is a 60 y.o. female who presents to the Emergency Department complaining of back pain that started on 09/29/15 when she was working in her kitchen and she turned and felt a sudden pain. She was reported to the ED yesterday and was given IV dilaudid. She returns today because her pain has not improved. She states her pain is radiating down her left leg. She denies any significant fall or trauma. She denies loss of bowel or bladder control, Loss of distal sensation strength or motor function, denies any fever. Patient reports significant pain with ambulation. She has an appointment with her orthopedist Dr. Rolena Infante on Monday.    Past Medical History:  Diagnosis Date  . Allergic rhinitis   . ANA positive   . Anemia    Iron deficiency  . Anxiety   . Cervicalgia    Chronic  . Complication of anesthesia    hard to go to sleep with Anesthesia-had to have Benadryl with Colonoscopy  . DDD (degenerative disc disease)    Cervical  . DJD (degenerative joint disease)   . Fibromyalgia   . GERD (gastroesophageal reflux disease)   . Hyperlipidemia   . Multinodular thyroid   . Neuropathy, peripheral ([place])   . Pneumonia   . Vitamin D deficiency 03/18/2011    Patient Active Problem List   Diagnosis Date Noted  . Memory dysfunction 04/01/2015  . 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 Cough 11/02/2013  . Herpes labialis 11/02/2013  . GERD (gastroesophageal reflux disease) 11/02/2012   . Anxiety 07/01/2011  . Vitamin D deficiency 03/18/2011  . Preventative health care 03/13/2011  . PERIPHERAL NEUROPATHY 10/03/2007  . Hyperlipidemia 01/05/2007  . ANEMIA-IRON DEFICIENCY 01/05/2007  . INSOMNIA-SLEEP DISORDER-UNSPEC 01/05/2007  . Allergic rhinitis 01/05/2007  . CONSTIPATION, CHRONIC 01/05/2007  . FIBROMYALGIA 01/05/2007    Past Surgical History:  Procedure Laterality Date  . ABDOMINAL HYSTERECTOMY    . ABDOMINAL HYSTERECTOMY     partial  . BUNIONECTOMY  2011   left foot  . DILATION AND CURETTAGE OF UTERUS    . KNEE ARTHROSCOPY  2004   right  . MYOMECTOMY  2002   prior to hysterectomy  . PARTIAL KNEE ARTHROPLASTY Right 10/07/2014   Procedure: RIGHT UNI KNEE ARTHROPLASTY LATERALLY ;  Surgeon: Paralee Cancel, MD;  Location: [facility];  Service: Orthopedics;  Laterality: Right;  . TONSILLECTOMY     age 75    OB History    No data available       Home Medications    Prior to Admission medications   Medication Sig Start Date End Date Taking? Authorizing Provider  diazepam (VALIUM) 5 MG tablet Take 1 tablet (5 mg total) by mouth every 8 (eight) hours as needed for muscle spasms. 09/13/15   Charlann Lange, PA-C  diclofenac sodium (VOLTAREN) 1 % GEL Apply 2 g topically 4 (four) times daily.    Historical Provider, MD  estrogens, conjugated, (PREMARIN) 0.625 MG tablet Take 0.625  mg by mouth daily.    Historical Provider, MD  Menthol, Topical Analgesic, (BIOFREEZE EX) Apply 1 application topically daily.    Historical Provider, MD  methocarbamol (ROBAXIN) 500 MG tablet Take 1 tablet (500 mg total) by mouth every 6 (six) hours as needed for muscle spasms. 10/08/14   Danae Orleans, PA-C  oxyCODONE-acetaminophen (PERCOCET/ROXICET) 5-325 MG tablet Take 1-2 tablets by mouth every 4 (four) hours as needed for severe pain. 09/13/15   Charlann Lange, PA-C  pantoprazole (PROTONIX) 40 MG tablet TAKE 1 TABLET EVERY DAY 08/22/15   [name], MD  pregabalin (LYRICA) 150 MG capsule Take  150 mg by mouth 3 (three) times daily.    Historical Provider, MD    Family History Family History  Problem Relation Age of Onset  . Heart disease Father   . Sickle cell trait    . Lung cancer    . Colon cancer      Aunt  . Diabetes Mother   . Stomach cancer Neg Hx   . Rectal cancer Neg Hx   . Esophageal cancer Neg Hx     Social History Social History  Substance Use Topics  . Smoking status: Never Smoker  . Smokeless tobacco: Never Used  . Alcohol use 0.0 oz/week    3 - 4 Glasses of wine per week     Allergies   Duloxetine and Penicillins   Review of Systems Review of Systems  Constitutional: Negative for chills and fever.  Musculoskeletal: Positive for arthralgias, gait problem and myalgias.  Skin: Positive for color change. Negative for wound.    Physical Exam Updated Vital Signs BP 127/72 (BP Location: Left Arm)   Pulse 72   Temp 98.2 F (36.8 C) (Oral)   Resp 16   Ht 5\' 3"  (1.6 m)   Wt 84.4 kg   SpO2 97%   BMI 32.95 kg/m   Physical Exam  Constitutional: She is oriented to person, place, and time. She appears well-developed and well-nourished. No distress.  HENT:  Head: Normocephalic and atraumatic.  Eyes: Pupils are equal, round, and reactive to light.  Neck: Neck supple.  Cardiovascular: Normal rate.   Pulmonary/Chest: Effort normal. No respiratory distress.  Musculoskeletal: Normal range of motion.  No CT or L-spine tenderness. Patient has tenderness to palpation of the left lateral lumbar soft tissue, the upper gluteus, and lower posterior thigh. Patient has no redness swelling or edema. Patient has sensation intact to the lower extremity is bilateral. Patient has pain with flexion of the hip, strength 5 out of 5, pedal pulses 2+.  Neurological: She is alert and oriented to person, place, and time. Coordination normal.  Skin: Skin is warm and dry. She is not diaphoretic.  Psychiatric: She has a normal mood and affect. Her behavior is normal.    Nursing note and vitals reviewed.    ED Treatments / Results  Labs (all labs ordered are listed, but only abnormal results are displayed) Labs Reviewed - No data to display  EKG  EKG Interpretation None       Radiology No results found.  Procedures Procedures (including critical care time)  Medications Ordered in ED Medications  HYDROmorphone (DILAUDID) injection 1 mg (1 mg Intramuscular Given 09/14/15 1433)  HYDROmorphone (DILAUDID) injection 1 mg (1 mg Intravenous Given 09/14/15 1612)     Initial Impression / Assessment and Plan / ED Course  I have reviewed the triage vital signs and the nursing notes.  Pertinent labs & imaging results that  were available during my care of the patient were reviewed by me and considered in my medical decision making (see chart for details).  Clinical Course     Final Clinical Impressions(s) / ED Diagnoses   Final diagnoses:  Left-sided low back pain with left-sided sciatica  Lower back pain   Labs:  Imaging:MRI lumbar spine without contrast  Consults:  Therapeutics: Dilaudid  Discharge Meds:   Assessment/Plan:  60 year old female presents today with left hip and back pain. Patient had been seen yesterday for the same, treated and discharged home. She reports symptoms again returned, she reports these come in waves with pain in her lower back and hip that radiates down into her leg. Upon my initial evaluation patient denied any neurological deficits, she would be treated for pain here in the ED and discharged home. Dilaudid IM here provided minor amount of relief, at the time of discharge she complained of a wave of pain again.   I reevaluated the patient, she continued to deny any lower extremity neurological deficits. She is able to raise her leg off the bed, reports severe pain in the lower back with this. Patient has no red flags or neurological emergency here in the ED, no need for further evaluation with imaging  studies. Patient received 1 more dose of Dilaudid here in the ED and be discharged home with close follow-up with Dr. Rolena Infante. Patient in agreement with this plan.  Nursing staff noted that after my last evaluation of the patient they requested that patient be admitted to the hospital for pain management. Prior to me being able to get back into the room to speak with the patient, patient's husband brought his cell phone to me, he had called on-call orthopedist Dr. Lillia Corporal. I spoke with Dr. Rise Patience intact, informed him that patient had no red flags. He suggested that due to patient's persistence MRI might be reasonable. I agreed, and patient continues to endorse significant lower lumbar pain and radiation of pain. She will not attempt ambulation as this causes significant pain. I very low suspicion for any acute abnormality on the MRI but we'll proceed to rule out any neurosurgical emergency. I informed the patient that hospital admission at this time is unlikely and that she would need to follow up with orthopedic specialist for reevaluation and further management of her acute on chronic pain. Both the patient and her husband were in agreement to today's plan. Patient care is signed off to oncoming provider per finding MRI, reevaluation and disposition.     New Prescriptions New Prescriptions   No medications on file     I personally performed the services described in this documentation, which was scribed in my presence. The recorded information has been reviewed and is accurate.       Okey Regal, PA-C 09/14/15 [place], PA-C 09/14/15 1735    Merrily Pew, MD 09/15/15 1044

## 2015-09-14 NOTE — ED Triage Notes (Signed)
Pt reports lower back pain that is radiating into her left leg was seen here yesterday and given injection. Today pain is still in back and left leg but now going into left foot. Pt denies any numbness. Sensation intact. Pt denies any loss of bowel or bladder.

## 2015-09-14 NOTE — ED Notes (Addendum)
Pt lying on stretcher, quietly, alert. When RN Attempted to d/c pt x 2 - pt then began, yelling "I'm having a spell" - pointing to her left leg. Spouse at bedside. RN assisted pt to w/c - pt noted w/slow movements state d/t she is unable to place weight on left leg. Pt noted to place weight on leg briefly. Both state they do not think pt will be able ambulate into house. Franne Forts, PA, aware.

## 2015-09-14 NOTE — ED Notes (Signed)
Pt returned from MRI - MRI Tech had called A Harris, PA, to advise pt unable to lie flat for MRI. Pt alert, oriented.

## 2015-09-14 NOTE — ED Notes (Signed)
Spouse talking w/J Hedges, PA - advising him he has called pt's dr in attempt to get pt admitted.

## 2015-09-14 NOTE — ED Provider Notes (Signed)
Patient unable to tolerate lying flat for MRI. No red flag sxs. Will d/c to follow with Dr. Rolena Infante.   Margarita Mail, PA-C 09/16/15 0154    Charlesetta Shanks, MD 10/03/15 [phone]

## 2015-09-14 NOTE — ED Notes (Addendum)
States was seen for same yesterday and is no better. RN assisted pt w/removing shorts, putting on hospital gown and lying on stretcher.

## 2015-09-16 ENCOUNTER — Encounter: Payer: 59 | Admitting: Internal Medicine

## 2015-10-08 NOTE — ED Provider Notes (Signed)
[facility] DEPT Provider Note   CSN: [id number] Arrival date & time: 09/13/15  0500     History   Chief Complaint Chief Complaint  Patient presents with  . Back Pain    HPI Gabrielle Benson is a 60 y.o. female.  Patient with a history of scoliosis presents with exacerbation of lower back pain that started shortly after helping her mother move earlier in the month. No known specific injury. She has been taking a muscle relaxer, pain medication and Lyrica without relief. No urinary or bowel incontinence. No numbness or weakness in lower extremities. She reports the pain is familiar as previous episodes of back pain. No abdominal pain, dysuria, constipation, fever.    The history is provided by the patient. No language interpreter was used.  Back Pain   Pertinent negatives include no fever, no numbness, no abdominal pain, no dysuria and no weakness.    Past Medical History:  Diagnosis Date  . Allergic rhinitis   . ANA positive   . Anemia    Iron deficiency  . Anxiety   . Cervicalgia    Chronic  . Complication of anesthesia    hard to go to sleep with Anesthesia-had to have Benadryl with Colonoscopy  . DDD (degenerative disc disease)    Cervical  . DJD (degenerative joint disease)   . Fibromyalgia   . GERD (gastroesophageal reflux disease)   . Hyperlipidemia   . Multinodular thyroid   . Neuropathy, peripheral ([place])   . Pneumonia   . Vitamin D deficiency 03/18/2011    Patient Active Problem List   Diagnosis Date Noted  . Memory dysfunction 04/01/2015  . 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 Cough 11/02/2013  . Herpes labialis 11/02/2013  . GERD (gastroesophageal reflux disease) 11/02/2012  . Anxiety 07/01/2011  . Vitamin D deficiency 03/18/2011  . Preventative health care 03/13/2011  . PERIPHERAL NEUROPATHY 10/03/2007  . Hyperlipidemia 01/05/2007  . ANEMIA-IRON DEFICIENCY 01/05/2007  . INSOMNIA-SLEEP DISORDER-UNSPEC 01/05/2007  . Allergic rhinitis 01/05/2007  .  CONSTIPATION, CHRONIC 01/05/2007  . FIBROMYALGIA 01/05/2007    Past Surgical History:  Procedure Laterality Date  . ABDOMINAL HYSTERECTOMY    . ABDOMINAL HYSTERECTOMY     partial  . BUNIONECTOMY  2011   left foot  . DILATION AND CURETTAGE OF UTERUS    . KNEE ARTHROSCOPY  2004   right  . MYOMECTOMY  2002   prior to hysterectomy  . PARTIAL KNEE ARTHROPLASTY Right 10/07/2014   Procedure: RIGHT UNI KNEE ARTHROPLASTY LATERALLY ;  Surgeon: Paralee Cancel, MD;  Location: [facility];  Service: Orthopedics;  Laterality: Right;  . TONSILLECTOMY     age 80    OB History    No data available       Home Medications    Prior to Admission medications   Medication Sig Start Date End Date Taking? Authorizing Provider  diclofenac sodium (VOLTAREN) 1 % GEL Apply 2 g topically 4 (four) times daily.   Yes Historical Provider, MD  estrogens, conjugated, (PREMARIN) 0.625 MG tablet Take 0.625 mg by mouth daily.   Yes Historical Provider, MD  Menthol, Topical Analgesic, (BIOFREEZE EX) Apply 1 application topically daily.   Yes Historical Provider, MD  methocarbamol (ROBAXIN) 500 MG tablet Take 1 tablet (500 mg total) by mouth every 6 (six) hours as needed for muscle spasms. 10/08/14  Yes Danae Orleans, PA-C  pantoprazole (PROTONIX) 40 MG tablet TAKE 1 TABLET EVERY DAY 08/22/15  Yes [name], MD  pregabalin (LYRICA) 150  MG capsule Take 150 mg by mouth 3 (three) times daily.   Yes Historical Provider, MD  diazepam (VALIUM) 5 MG tablet Take 1 tablet (5 mg total) by mouth every 8 (eight) hours as needed for muscle spasms. 09/13/15   Charlann Lange, PA-C  oxyCODONE-acetaminophen (PERCOCET/ROXICET) 5-325 MG tablet Take 1-2 tablets by mouth every 4 (four) hours as needed for severe pain. 09/13/15   Charlann Lange, PA-C    Family History Family History  Problem Relation Age of Onset  . Heart disease Father   . Sickle cell trait    . Lung cancer    . Colon cancer      Aunt  . Diabetes Mother   . Stomach  cancer Neg Hx   . Rectal cancer Neg Hx   . Esophageal cancer Neg Hx     Social History Social History  Substance Use Topics  . Smoking status: Never Smoker  . Smokeless tobacco: Never Used  . Alcohol use 0.0 oz/week    3 - 4 Glasses of wine per week     Allergies   Duloxetine and Penicillins   Review of Systems Review of Systems  Constitutional: Negative for chills and fever.  Gastrointestinal: Negative.  Negative for abdominal pain.  Genitourinary: Negative.  Negative for dysuria.  Musculoskeletal: Positive for back pain.       See HPI.  Skin: Negative.   Neurological: Negative.  Negative for weakness and numbness.     Physical Exam Updated Vital Signs BP 139/97   Pulse 66   Temp 98.1 F (36.7 C) (Oral)   Resp 16   Ht 5\' 3"  (1.6 m)   Wt 84.4 kg   SpO2 96%   BMI 32.95 kg/m   Physical Exam  Constitutional: She appears well-developed and well-nourished.  HENT:  Head: Normocephalic.  Neck: Normal range of motion. Neck supple.  Cardiovascular: Normal rate, regular rhythm and intact distal pulses.   Pulmonary/Chest: Effort normal and breath sounds normal. She has no wheezes. She has no rales.  Abdominal: Soft. Bowel sounds are normal. There is no tenderness. There is no rebound and no guarding.  Musculoskeletal: Normal range of motion.  Lumbar and paralumbar tenderness without swelling or discoloration. Significant pain with movement of torso or lower extremities without apparent loss of strength.  Neurological: She is alert. She has normal reflexes. No cranial nerve deficit. Coordination normal.  Skin: Skin is warm and dry. No rash noted.  Psychiatric: She has a normal mood and affect.  Nursing note and vitals reviewed.    ED Treatments / Results  Labs (all labs ordered are listed, but only abnormal results are displayed) Labs Reviewed - No data to display  EKG  EKG Interpretation None       Radiology No results found.  Procedures Procedures  (including critical care time)  Medications Ordered in ED Medications  HYDROmorphone (DILAUDID) injection 1 mg (1 mg Intramuscular Given 09/13/15 0609)  diazepam (VALIUM) tablet 5 mg (5 mg Oral Given 09/13/15 0612)  ketorolac (TORADOL) injection 60 mg (60 mg Intramuscular Given 09/13/15 0719)     Initial Impression / Assessment and Plan / ED Course  I have reviewed the triage vital signs and the nursing notes.  Pertinent labs & imaging results that were available during my care of the patient were reviewed by me and considered in my medical decision making (see chart for details).  Clinical Course    Patient provided pain medications with improvement but not resolution of pain. She  has no neurologic red flags. No concern for fracture or acute infection.   Will discharge her to home with planned follow up with Dr. Rolena Infante.   Final Clinical Impressions(s) / ED Diagnoses   Final diagnoses:  Bilateral low back pain with sciatica, sciatica laterality unspecified  Scoliosis    New Prescriptions Discharge Medication List as of 09/13/2015  8:18 AM    START taking these medications   Details  diazepam (VALIUM) 5 MG tablet Take 1 tablet (5 mg total) by mouth every 8 (eight) hours as needed for muscle spasms., Starting Sat 09/13/2015, Print    oxyCODONE-acetaminophen (PERCOCET/ROXICET) 5-325 MG tablet Take 1-2 tablets by mouth every 4 (four) hours as needed for severe pain., Starting Sat 09/13/2015, Print         Charlann Lange, PA-C 10/08/15 0401    Courteney Julio Alm, MD 10/12/15 1424

## 2015-10-09 ENCOUNTER — Other Ambulatory Visit: Payer: Self-pay | Admitting: Internal Medicine

## 2015-10-09 ENCOUNTER — Ambulatory Visit (INDEPENDENT_AMBULATORY_CARE_PROVIDER_SITE_OTHER): Payer: 59 | Admitting: Internal Medicine

## 2015-10-09 ENCOUNTER — Encounter: Payer: Self-pay | Admitting: Internal Medicine

## 2015-10-09 ENCOUNTER — Other Ambulatory Visit (INDEPENDENT_AMBULATORY_CARE_PROVIDER_SITE_OTHER): Payer: 59

## 2015-10-09 VITALS — BP 130/70 | HR 72 | Temp 98.4°F | Resp 20 | Wt 188.0 lb

## 2015-10-09 DIAGNOSIS — G894 Chronic pain syndrome: Secondary | ICD-10-CM

## 2015-10-09 DIAGNOSIS — Z0001 Encounter for general adult medical examination with abnormal findings: Secondary | ICD-10-CM

## 2015-10-09 DIAGNOSIS — Z1159 Encounter for screening for other viral diseases: Secondary | ICD-10-CM | POA: Diagnosis not present

## 2015-10-09 DIAGNOSIS — R6889 Other general symptoms and signs: Secondary | ICD-10-CM

## 2015-10-09 LAB — HEPATIC FUNCTION PANEL
ALT: 15 U/L (ref 0–35)
AST: 13 U/L (ref 0–37)
Albumin: 3.7 g/dL (ref 3.5–5.2)
Alkaline Phosphatase: 92 U/L (ref 39–117)
BILIRUBIN DIRECT: -0.1 mg/dL — AB (ref 0.0–0.3)
BILIRUBIN TOTAL: 0.3 mg/dL (ref 0.2–1.2)
Total Protein: 7.2 g/dL (ref 6.0–8.3)

## 2015-10-09 LAB — CBC WITH DIFFERENTIAL/PLATELET
BASOS PCT: 0.4 % (ref 0.0–3.0)
Basophils Absolute: 0 10*3/uL (ref 0.0–0.1)
EOS PCT: 0.5 % (ref 0.0–5.0)
Eosinophils Absolute: 0 10*3/uL (ref 0.0–0.7)
HEMATOCRIT: 35.9 % — AB (ref 36.0–46.0)
HEMOGLOBIN: 11.8 g/dL — AB (ref 12.0–15.0)
LYMPHS PCT: 26.2 % (ref 12.0–46.0)
Lymphs Abs: 1.6 10*3/uL (ref 0.7–4.0)
MCHC: 32.8 g/dL (ref 30.0–36.0)
MCV: 82.4 fl (ref 78.0–100.0)
Monocytes Absolute: 0.4 10*3/uL (ref 0.1–1.0)
Monocytes Relative: 6.4 % (ref 3.0–12.0)
NEUTROS ABS: 4.1 10*3/uL (ref 1.4–7.7)
Neutrophils Relative %: 66.5 % (ref 43.0–77.0)
PLATELETS: 365 10*3/uL (ref 150.0–400.0)
RBC: 4.36 Mil/uL (ref 3.87–5.11)
RDW: 14.8 % (ref 11.5–15.5)
WBC: 6.2 10*3/uL (ref 4.0–10.5)

## 2015-10-09 LAB — URINALYSIS, ROUTINE W REFLEX MICROSCOPIC
BILIRUBIN URINE: NEGATIVE
Hgb urine dipstick: NEGATIVE
Leukocytes, UA: NEGATIVE
NITRITE: NEGATIVE
Specific Gravity, Urine: 1.025 (ref 1.000–1.030)
Total Protein, Urine: NEGATIVE
Urine Glucose: NEGATIVE
Urobilinogen, UA: 0.2 (ref 0.0–1.0)
pH: 6 (ref 5.0–8.0)

## 2015-10-09 LAB — LIPID PANEL
CHOL/HDL RATIO: 3
Cholesterol: 240 mg/dL — ABNORMAL HIGH (ref 0–200)
HDL: 69.2 mg/dL (ref >39.00)
LDL CALC: 154 mg/dL — AB (ref 0–99)
NONHDL: 170.83
TRIGLYCERIDES: 86 mg/dL (ref 0.0–149.0)
VLDL: 17.2 mg/dL (ref 0.0–40.0)

## 2015-10-09 LAB — BASIC METABOLIC PANEL
BUN: 11 mg/dL (ref 6–23)
CHLORIDE: 102 meq/L (ref 96–112)
CO2: 35 mEq/L — ABNORMAL HIGH (ref 19–32)
CREATININE: 0.63 mg/dL (ref 0.40–1.20)
Calcium: 10.2 mg/dL (ref 8.4–10.5)
GFR: 124.7 mL/min (ref >60.00)
Glucose, Bld: 98 mg/dL (ref 70–99)
POTASSIUM: 4.4 meq/L (ref 3.5–5.1)
Sodium: 139 mEq/L (ref 135–145)

## 2015-10-09 LAB — TSH: TSH: 0.73 u[IU]/mL (ref 0.35–4.50)

## 2015-10-09 MED ORDER — ATORVASTATIN CALCIUM 20 MG PO TABS: 20.0000 mg | ORAL_TABLET | Freq: Every day | ORAL | 3 refills | Status: DC

## 2015-10-09 MED ORDER — OXYCODONE-ACETAMINOPHEN 5-325 MG PO TABS: 1.0000 | ORAL_TABLET | Freq: Four times a day (QID) | ORAL | 0 refills | Status: DC | PRN

## 2015-10-09 NOTE — Progress Notes (Signed)
Subjective:    Patient ID: Gabrielle Benson, female    DOB: [birth year], 58 y.o.   MRN: [id number]  HPI  Here for wellness and f/u;  Overall doing ok;  Pt denies Chest pain, worsening SOB, DOE, wheezing, orthopnea, PND, worsening LE edema, palpitations, dizziness or syncope.  Pt denies neurological change such as new headache, facial or extremity weakness.  Pt denies polydipsia, polyuria, or low sugar symptoms. Pt states overall good compliance with treatment and medications, good tolerability, and has been trying to follow appropriate diet.  Pt denies worsening depressive symptoms, suicidal ideation or panic. No fever, night sweats, wt loss, loss of appetite, or other constitutional symptoms.  Pt states good ability with ADL's, has low fall risk, home safety reviewed and adequate, no other significant changes in hearing or vision, and only occasionally active with exercise.  Pt continues to have recurring left > right  LBP without change in severity, bowel or bladder change, fever, wt loss,  worsening LE pain/numbness/weakness, gait change or falls. But pain caused out of work for last few months, walking with cane today.  Had worse pain recent;yu aug 26 with worsening left back pain/sciatica had to go to ER for toradol and dilautdid; still has some numbness to left distal leg as a result. Has been seeing Dr Lannette Donath, eventually had an MRI last tue sept 12, then SI joint injection, has seen some improvement already, last seen per ortho on sept 16 thought to be doing better overall, no indication for surgury, has chronic lumbar degenerative changes, scoliosis and now left sciatica/  Sched now for nerve block injection on Oct 12, then start PT on Oct 13.  Out on [employer] disability from aug 25.  Pain overall improved today, seems to be overall worse more at night with lying down, but also sometimes depends on being up more during the day, then the night would be even worse.  Ortho will no longer prescribe  narcotic since no surgury is not being planned. Past Medical History:  Diagnosis Date  . Allergic rhinitis   . ANA positive   . Anemia    Iron deficiency  . Anxiety   . Cervicalgia    Chronic  . Complication of anesthesia    hard to go to sleep with Anesthesia-had to have Benadryl with Colonoscopy  . DDD (degenerative disc disease)    Cervical  . DJD (degenerative joint disease)   . Fibromyalgia   . GERD (gastroesophageal reflux disease)   . Hyperlipidemia   . Multinodular thyroid   . Neuropathy, peripheral ([place])   . Pneumonia   . Vitamin D deficiency 03/18/2011   Past Surgical History:  Procedure Laterality Date  . ABDOMINAL HYSTERECTOMY    . ABDOMINAL HYSTERECTOMY     partial  . BUNIONECTOMY  2011   left foot  . DILATION AND CURETTAGE OF UTERUS    . KNEE ARTHROSCOPY  2004   right  . MYOMECTOMY  2002   prior to hysterectomy  . PARTIAL KNEE ARTHROPLASTY Right 10/07/2014   Procedure: RIGHT UNI KNEE ARTHROPLASTY LATERALLY ;  Surgeon: Paralee Cancel, MD;  Location: [facility];  Service: Orthopedics;  Laterality: Right;  . TONSILLECTOMY     age 36    reports that she has never smoked. She has never used smokeless tobacco. She reports that she drinks alcohol. She reports that she does not use drugs. family history includes Diabetes in her mother; Heart disease in her father. Allergies  Allergen Reactions  . Duloxetine  REACTION: dizzy, nervous  . Penicillins     Has patient had a PCN reaction causing immediate rash, facial/tongue/throat swelling, SOB or lightheadedness with hypotension: No Has patient had a PCN reaction causing severe rash involving mucus membranes or skin necrosis: No Has patient had a PCN reaction that required hospitalization: No Has patient had a PCN reaction occurring within the last 10 years: No If all of the above answers are "NO", then may proceed with Cephalosporin use.      Current Outpatient Prescriptions on File Prior to Visit  Medication  Sig Dispense Refill  . diazepam (VALIUM) 5 MG tablet Take 1 tablet (5 mg total) by mouth every 8 (eight) hours as needed for muscle spasms. 15 tablet 0  . diclofenac sodium (VOLTAREN) 1 % GEL Apply 2 g topically 4 (four) times daily.    Marland Kitchen estrogens, conjugated, (PREMARIN) 0.625 MG tablet Take 0.625 mg by mouth daily.    . Menthol, Topical Analgesic, (BIOFREEZE EX) Apply 1 application topically daily.    . methocarbamol (ROBAXIN) 500 MG tablet Take 1 tablet (500 mg total) by mouth every 6 (six) hours as needed for muscle spasms. 50 tablet 0  . pantoprazole (PROTONIX) 40 MG tablet TAKE 1 TABLET EVERY DAY 90 tablet 1  . pregabalin (LYRICA) 150 MG capsule Take 150 mg by mouth 3 (three) times daily.     No current facility-administered medications on file prior to visit.    Review of Systems Constitutional: Negative for increased diaphoresis, or other activity, appetite or siginficant weight change other than noted HENT: Negative for worsening hearing loss, ear pain, facial swelling, mouth sores and neck stiffness.   Eyes: Negative for other worsening pain, redness or visual disturbance.  Respiratory: Negative for choking or stridor Cardiovascular: Negative for other chest pain and palpitations.  Gastrointestinal: Negative for worsening diarrhea, blood in stool, or abdominal distention Genitourinary: Negative for hematuria, flank pain or change in urine volume.  Musculoskeletal: Negative for myalgias or other joint complaints.  Skin: Negative for other color change and wound or drainage.  Neurological: Negative for syncope and numbness. other than noted Hematological: Negative for adenopathy. or other swelling Psychiatric/Behavioral: Negative for hallucinations, SI, self-injury, decreased concentration or other worsening agitation.      Objective:   Physical Exam BP 130/70   Pulse 72   Temp 98.4 F (36.9 C) (Oral)   Resp 20   Wt 188 lb (85.3 kg)   SpO2 98%   BMI 33.30 kg/m  VS noted,    Constitutional: Pt is oriented to person, place, and time. Appears well-developed and well-nourished, in no significant distress Head: Normocephalic and atraumatic  Eyes: Conjunctivae and EOM are normal. Pupils are equal, round, and reactive to light Right Ear: External ear normal.  Left Ear: External ear normal Nose: Nose normal.  Mouth/Throat: Oropharynx is clear and moist  Neck: Normal range of motion. Neck supple. No JVD present. No tracheal deviation present or significant neck LA or mass Cardiovascular: Normal rate, regular rhythm, normal heart sounds and intact distal pulses.   Pulmonary/Chest: Effort normal and breath sounds without rales or wheezing  Abdominal: Soft. Bowel sounds are normal. NT. No HSM  Musculoskeletal: Normal range of motion. Exhibits no edema Lymphadenopathy: Has no cervical adenopathy.  Neurological: Pt is alert and oriented to person, place, and time. Pt has normal reflexes. No cranial nerve deficit. Motor grossly intact Skin: Skin is warm and dry. No rash noted or new ulcers Psychiatric:  Has normal mood and affect. Behavior  is normal.      Assessment & Plan:

## 2015-10-09 NOTE — Progress Notes (Signed)
Pre visit review using our clinic review tool, if applicable. No additional management support is needed unless otherwise documented below in the visit note. 

## 2015-10-09 NOTE — Patient Instructions (Signed)
Please continue all other medications as before, and refills have been done if requested - the oxycodone  Please have the pharmacy call with any other refills you may need.  Please continue your efforts at being more active, low cholesterol diet, and weight control.  You are otherwise up to date with prevention measures today.  Please keep your appointments with your specialists as you may have planned  Please go to the LAB in the Basement (turn left off the elevator) for the tests to be done today  You will be contacted by phone if any changes need to be made immediately.  Otherwise, you will receive a letter about your results with an explanation, but please check with MyChart first.  Please remember to sign up for MyChart if you have not done so, as this will be important to you in the future with finding out test results, communicating by private email, and scheduling acute appointments online when needed.  Please return in 1 year for your yearly visit, or sooner if needed, with Lab testing done 3-5 days before

## 2015-10-10 LAB — HEPATITIS C ANTIBODY: HCV Ab: NEGATIVE

## 2015-10-11 DIAGNOSIS — G894 Chronic pain syndrome: Secondary | ICD-10-CM | POA: Insufficient documentation

## 2015-10-11 NOTE — Assessment & Plan Note (Signed)
D/w pt, needs further pain control, for refill today,  to f/u any worsening symptoms or concerns  In addition to the time spent performing CPE, I spent an additional 15 minutes face to face,in which greater than 50% of this time was spent in counseling and coordination of care for patient's illness as documented.

## 2015-10-11 NOTE — Assessment & Plan Note (Signed)

## 2016-01-20 ENCOUNTER — Telehealth: Payer: Self-pay | Admitting: Internal Medicine

## 2016-01-20 ENCOUNTER — Other Ambulatory Visit: Payer: Self-pay | Admitting: Internal Medicine

## 2016-01-20 ENCOUNTER — Other Ambulatory Visit: Payer: Self-pay | Admitting: Rheumatology

## 2016-01-20 NOTE — Telephone Encounter (Signed)
09/04/15 last visit Next visit not sch message sent  Ok to refill per Dr Estanislado Pandy

## 2016-01-20 NOTE — Telephone Encounter (Signed)
Not sure what is being referred to  ? Probiotics; to please clarify, and if so, ok for Align OTC, but there are no other that are more effective

## 2016-01-20 NOTE — Telephone Encounter (Signed)
Pt called in said that her proteics are not working.  She said that she is needing something stronger.  She is wanting a call from the nurse to talk about a new med

## 2016-01-21 NOTE — Telephone Encounter (Signed)
Patient aware.

## 2016-01-23 DIAGNOSIS — M5442 Lumbago with sciatica, left side: Secondary | ICD-10-CM | POA: Diagnosis not present

## 2016-01-23 DIAGNOSIS — G8929 Other chronic pain: Secondary | ICD-10-CM | POA: Diagnosis not present

## 2016-01-26 DIAGNOSIS — G8929 Other chronic pain: Secondary | ICD-10-CM | POA: Diagnosis not present

## 2016-01-26 DIAGNOSIS — M5442 Lumbago with sciatica, left side: Secondary | ICD-10-CM | POA: Diagnosis not present

## 2016-01-29 DIAGNOSIS — G8929 Other chronic pain: Secondary | ICD-10-CM | POA: Diagnosis not present

## 2016-01-29 DIAGNOSIS — M5442 Lumbago with sciatica, left side: Secondary | ICD-10-CM | POA: Diagnosis not present

## 2016-02-02 DIAGNOSIS — G8929 Other chronic pain: Secondary | ICD-10-CM | POA: Diagnosis not present

## 2016-02-02 DIAGNOSIS — M5442 Lumbago with sciatica, left side: Secondary | ICD-10-CM | POA: Diagnosis not present

## 2016-02-09 DIAGNOSIS — M5442 Lumbago with sciatica, left side: Secondary | ICD-10-CM | POA: Diagnosis not present

## 2016-02-09 DIAGNOSIS — G8929 Other chronic pain: Secondary | ICD-10-CM | POA: Diagnosis not present

## 2016-02-12 DIAGNOSIS — G8929 Other chronic pain: Secondary | ICD-10-CM | POA: Diagnosis not present

## 2016-02-12 DIAGNOSIS — M5442 Lumbago with sciatica, left side: Secondary | ICD-10-CM | POA: Diagnosis not present

## 2016-02-17 DIAGNOSIS — G8929 Other chronic pain: Secondary | ICD-10-CM | POA: Diagnosis not present

## 2016-02-17 DIAGNOSIS — M5442 Lumbago with sciatica, left side: Secondary | ICD-10-CM | POA: Diagnosis not present

## 2016-02-18 ENCOUNTER — Ambulatory Visit (INDEPENDENT_AMBULATORY_CARE_PROVIDER_SITE_OTHER): Payer: 59 | Admitting: Internal Medicine

## 2016-02-18 VITALS — BP 136/76 | HR 86 | Temp 98.4°F | Resp 20 | Wt 193.0 lb

## 2016-02-18 DIAGNOSIS — R062 Wheezing: Secondary | ICD-10-CM | POA: Diagnosis not present

## 2016-02-18 DIAGNOSIS — R05 Cough: Secondary | ICD-10-CM

## 2016-02-18 DIAGNOSIS — J309 Allergic rhinitis, unspecified: Secondary | ICD-10-CM | POA: Diagnosis not present

## 2016-02-18 DIAGNOSIS — R059 Cough, unspecified: Secondary | ICD-10-CM

## 2016-02-18 MED ORDER — PREDNISONE 10 MG PO TABS: ORAL_TABLET | ORAL | 0 refills | Status: DC

## 2016-02-18 MED ORDER — AZITHROMYCIN 250 MG PO TABS: ORAL_TABLET | ORAL | 1 refills | Status: DC

## 2016-02-18 MED ORDER — HYDROCOD POLST-CPM POLST ER 10-8 MG/5ML PO SUER: 5.0000 mL | Freq: Two times a day (BID) | ORAL | 0 refills | Status: DC | PRN

## 2016-02-18 NOTE — Assessment & Plan Note (Signed)
Also to improve with steroid tx as well, then OTC zyrtec/nasacort asd,  to f/u any worsening symptoms or concerns

## 2016-02-18 NOTE — Progress Notes (Signed)
Subjective:    Patient ID: Gabrielle Benson, female    DOB: 1955/07/16, 61 y.o.   MRN: [id number]  HPI  Here with acute onset mild to mod 2-3 days ST, HA, general weakness and malaise, with prod cough greenish sputum, but Pt denies chest pain, increased sob or doe, wheezing, orthopnea, PND, increased LE swelling, palpitations, dizziness or syncope, except for onset mild wheezing and sob last PM. Pt denies new neurological symptoms such as new headache, or facial or extremity weakness or numbness   Pt denies polydipsia, polyuria  Does have several wks ongoing nasal allergy symptoms with clearish congestion, itch and sneezing, without fever, pain, ST, cough, swelling or wheezing. Past Medical History:  Diagnosis Date  . Allergic rhinitis   . ANA positive   . Anemia    Iron deficiency  . Anxiety   . Cervicalgia    Chronic  . Complication of anesthesia    hard to go to sleep with Anesthesia-had to have Benadryl with Colonoscopy  . DDD (degenerative disc disease)    Cervical  . DJD (degenerative joint disease)   . Fibromyalgia   . GERD (gastroesophageal reflux disease)   . Hyperlipidemia   . Multinodular thyroid   . Neuropathy, peripheral ([place])   . Pneumonia   . Vitamin D deficiency 03/18/2011   Past Surgical History:  Procedure Laterality Date  . ABDOMINAL HYSTERECTOMY    . ABDOMINAL HYSTERECTOMY     partial  . BUNIONECTOMY  2011   left foot  . DILATION AND CURETTAGE OF UTERUS    . KNEE ARTHROSCOPY  2004   right  . MYOMECTOMY  2002   prior to hysterectomy  . PARTIAL KNEE ARTHROPLASTY Right 10/07/2014   Procedure: RIGHT UNI KNEE ARTHROPLASTY LATERALLY ;  Surgeon: Paralee Cancel, MD;  Location: [facility];  Service: Orthopedics;  Laterality: Right;  . TONSILLECTOMY     age 53    reports that she has never smoked. She has never used smokeless tobacco. She reports that she drinks alcohol. She reports that she does not use drugs. family history includes Diabetes in her mother; Heart  disease in her father. Allergies  Allergen Reactions  . Duloxetine     REACTION: dizzy, nervous  . Penicillins     Has patient had a PCN reaction causing immediate rash, facial/tongue/throat swelling, SOB or lightheadedness with hypotension: No Has patient had a PCN reaction causing severe rash involving mucus membranes or skin necrosis: No Has patient had a PCN reaction that required hospitalization: No Has patient had a PCN reaction occurring within the last 10 years: No If all of the above answers are "NO", then may proceed with Cephalosporin use.     Current Outpatient Prescriptions on File Prior to Visit  Medication Sig Dispense Refill  . atorvastatin (LIPITOR) 20 MG tablet Take 1 tablet (20 mg total) by mouth daily. 90 tablet 3  . diazepam (VALIUM) 5 MG tablet Take 1 tablet (5 mg total) by mouth every 8 (eight) hours as needed for muscle spasms. 15 tablet 0  . diclofenac sodium (VOLTAREN) 1 % GEL Apply 2 g topically 4 (four) times daily.    Marland Kitchen estrogens, conjugated, (PREMARIN) 0.625 MG tablet Take 0.625 mg by mouth daily.    . Menthol, Topical Analgesic, (BIOFREEZE EX) Apply 1 application topically daily.    . methocarbamol (ROBAXIN) 500 MG tablet TAKE 1 TABLET BY MOUTH TWICE A DAY AT 7 AM AND 2 PM 180 tablet 1  . oxyCODONE-acetaminophen (PERCOCET/ROXICET) 5-325 MG  tablet Take 1 tablet by mouth every 6 (six) hours as needed for severe pain. 160 tablet 0  . pantoprazole (PROTONIX) 40 MG tablet TAKE 1 TABLET EVERY DAY 90 tablet 1  . pregabalin (LYRICA) 150 MG capsule Take 150 mg by mouth 3 (three) times daily.     No current facility-administered medications on file prior to visit.    Review of Systems  Constitutional: Negative for unusual diaphoresis or night sweats HENT: Negative for ear swelling or discharge Eyes: Negative for worsening visual haziness  Respiratory: Negative for choking and stridor.   Gastrointestinal: Negative for distension or worsening  eructation Genitourinary: Negative for retention or change in urine volume.  Musculoskeletal: Negative for other MSK pain or swelling Skin: Negative for color change and worsening wound Neurological: Negative for tremors and numbness other than noted  Psychiatric/Behavioral: Negative for decreased concentration or agitation other than above   All other system neg per pt    Objective:   Physical Exam BP 136/76   Pulse 86   Temp 98.4 F (36.9 C) (Oral)   Resp 20   Wt 193 lb (87.5 kg)   SpO2 96%   BMI 34.19 kg/m  VS noted, mild ill Constitutional: Pt appears in no apparent distress HENT: Head: NCAT.  Right Ear: External ear normal.  Left Ear: External ear normal.  Bilat tm's with mild erythema.  Max sinus areas non tender.  Pharynx with mild erythema, no exudate Eyes: . Pupils are equal, round, and reactive to light. Conjunctivae and EOM are normal Neck: Normal range of motion. Neck supple.  Cardiovascular: Normal rate and regular rhythm.   Pulmonary/Chest: Effort normal and breath sounds decreased without rales but with few mild scattered wheezing.  Neurological: Pt is alert. Not confused , motor grossly intact Skin: Skin is warm. No rash, no LE edema Psychiatric: Pt behavior is normal. No agitation.  No other new exam findings    Assessment & Plan:

## 2016-02-18 NOTE — Assessment & Plan Note (Signed)
Mild to mod, c/w bronchitis vs pna, declines cxr, for antibx course, cough med prn,  to f/u any worsening symptoms or concerns 

## 2016-02-18 NOTE — Patient Instructions (Signed)
You had the steroid shot today  Please take all new medication as prescribed - the antibiotic, cough medicine if needed, and the prednisone  Please continue all other medications as before, and refills have been done if requested.  Please have the pharmacy call with any other refills you may need.  Please keep your appointments with your specialists as you may have planned     

## 2016-02-18 NOTE — Progress Notes (Signed)
Pre visit review using our clinic review tool, if applicable. No additional management support is needed unless otherwise documented below in the visit note. 

## 2016-02-18 NOTE — Assessment & Plan Note (Signed)
Mild, for depomedrol 80 IM, predpac asd,  to f/u any worsening symptoms or concerns

## 2016-02-19 DIAGNOSIS — G8929 Other chronic pain: Secondary | ICD-10-CM | POA: Diagnosis not present

## 2016-02-19 DIAGNOSIS — M5442 Lumbago with sciatica, left side: Secondary | ICD-10-CM | POA: Diagnosis not present

## 2016-02-24 ENCOUNTER — Ambulatory Visit: Payer: Self-pay | Admitting: Rheumatology

## 2016-03-23 DIAGNOSIS — Z4789 Encounter for other orthopedic aftercare: Secondary | ICD-10-CM | POA: Diagnosis not present

## 2016-03-25 DIAGNOSIS — M2550 Pain in unspecified joint: Secondary | ICD-10-CM | POA: Insufficient documentation

## 2016-03-25 DIAGNOSIS — M545 Low back pain, unspecified: Secondary | ICD-10-CM | POA: Insufficient documentation

## 2016-03-25 DIAGNOSIS — Z87898 Personal history of other specified conditions: Secondary | ICD-10-CM | POA: Insufficient documentation

## 2016-03-25 DIAGNOSIS — M7061 Trochanteric bursitis, right hip: Secondary | ICD-10-CM | POA: Insufficient documentation

## 2016-03-25 DIAGNOSIS — M7062 Trochanteric bursitis, left hip: Secondary | ICD-10-CM

## 2016-03-25 NOTE — Progress Notes (Signed)
Office Visit Note  Patient: Gabrielle Benson             Date of Birth: Nov 04, 1955           MRN: [id number]             PCP: Cathlean Cower, MD Referring: [name], MD Visit Date: 03/26/2016 Occupation: @GUAROCC @    Subjective:  Follow-up Fibromyalgia and bilateral greater trochanter bursa pain  History of Present Illness: Gabrielle Benson is a 61 y.o. female  Last seen 09/04/2015.  Patient reports that she tried to get back injections through Dr. Nelva Bush in September because she is having pain going down the left leg. He gave her 2 injections and they both failed. Therefore she followed up with Dr. Rolena Infante and she had surgery done on L4-L5. Dr. Rolena Infante took her out of work for certain amount of time but [employer] told the patient that she needed to go back to work 4 weeks earlier than what Dr. Rolena Infante had suggested. Patient has ongoing pain down her left leg consistent with radiculopathy. She struggles with that pain on a daily basis. Despite successfully having some weight loss, the left radiculopathy is still an issue for the patient.    Activities of Daily Living:  Patient reports morning stiffness for 30 minutes.   Patient Reports nocturnal pain.  Difficulty dressing/grooming: Reports Difficulty climbing stairs: Reports Difficulty getting out of chair: Reports Difficulty using hands for taps, buttons, cutlery, and/or writing: Denies   Review of Systems  Constitutional: Positive for fatigue.  HENT: Negative for mouth sores and mouth dryness.   Eyes: Negative for dryness.  Respiratory: Negative for shortness of breath.   Gastrointestinal: Negative for constipation and diarrhea.  Musculoskeletal: Positive for myalgias and myalgias.  Skin: Negative for sensitivity to sunlight.  Psychiatric/Behavioral: Positive for sleep disturbance. Negative for decreased concentration.    PMFS History:  Patient Active Problem List   Diagnosis Date Noted  . Acute  left-sided low back pain 03/25/2016  . Pain, joint, multiple sites 03/25/2016  . History of insomnia 03/25/2016  . Greater trochanteric bursitis of both hips 03/25/2016  . Wheezing 02/18/2016  . Chronic pain syndrome 10/11/2015  . Memory dysfunction 04/01/2015  . History of bilateral knee replacement 10/07/2014  . Cough 11/02/2013  . Herpes labialis 11/02/2013  . GERD (gastroesophageal reflux disease) 11/02/2012  . Anxiety 07/01/2011  . Vitamin D deficiency 03/18/2011  . Encounter for well adult exam with abnormal findings 03/13/2011  . PERIPHERAL NEUROPATHY 10/03/2007  . Hyperlipidemia 01/05/2007  . ANEMIA-IRON DEFICIENCY 01/05/2007  . INSOMNIA-SLEEP DISORDER-UNSPEC 01/05/2007  . Allergic rhinitis 01/05/2007  . CONSTIPATION, CHRONIC 01/05/2007  . Fibromyalgia syndrome 01/05/2007    Past Medical History:  Diagnosis Date  . Allergic rhinitis   . ANA positive   . Anemia    Iron deficiency  . Anxiety   . Cervicalgia    Chronic  . Complication of anesthesia    hard to go to sleep with Anesthesia-had to have Benadryl with Colonoscopy  . DDD (degenerative disc disease)    Cervical  . DJD (degenerative joint disease)   . Fibromyalgia   . GERD (gastroesophageal reflux disease)   . Hyperlipidemia   . Multinodular thyroid   . Neuropathy, peripheral ([place])   . Pneumonia   . Vitamin D deficiency 03/18/2011    Family History  Problem Relation Age of Onset  . Heart disease Father   . Sickle cell trait    . Lung cancer    .  Colon cancer      Aunt  . Diabetes Mother   . Stomach cancer Neg Hx   . Rectal cancer Neg Hx   . Esophageal cancer Neg Hx    Past Surgical History:  Procedure Laterality Date  . ABDOMINAL HYSTERECTOMY    . ABDOMINAL HYSTERECTOMY     partial  . BUNIONECTOMY  2011   left foot  . DILATION AND CURETTAGE OF UTERUS    . KNEE ARTHROSCOPY  2004   right  . MYOMECTOMY  2002   prior to hysterectomy  . PARTIAL KNEE ARTHROPLASTY Right 10/07/2014   Procedure:  RIGHT UNI KNEE ARTHROPLASTY LATERALLY ;  Surgeon: Paralee Cancel, MD;  Location: [facility];  Service: Orthopedics;  Laterality: Right;  . TONSILLECTOMY     age 38   Social History   Social History Narrative  . No narrative on file     Objective: Vital Signs: BP 138/78   Pulse 78   Resp 16   Ht 5\' 3"  (1.6 m)   Wt 183 lb (83 kg)   BMI 32.42 kg/m    Physical Exam  Constitutional: She is oriented to person, place, and time. She appears well-developed and well-nourished.  HENT:  Head: Normocephalic and atraumatic.  Eyes: EOM are normal. Pupils are equal, round, and reactive to light.  Cardiovascular: Normal rate, regular rhythm and normal heart sounds.  Exam reveals no gallop and no friction rub.   No murmur heard. Pulmonary/Chest: Effort normal and breath sounds normal. She has no wheezes. She has no rales.  Abdominal: Soft. Bowel sounds are normal. She exhibits no distension. There is no tenderness. There is no guarding. No hernia.  Musculoskeletal: Normal range of motion. She exhibits no edema, tenderness or deformity.  Lymphadenopathy:    She has no cervical adenopathy.  Neurological: She is alert and oriented to person, place, and time. Coordination normal.  Skin: Skin is warm and dry. Capillary refill takes less than 2 seconds. No rash noted.  Psychiatric: She has a normal mood and affect. Her behavior is normal.  Nursing note and vitals reviewed.    Musculoskeletal Exam:  Full range of motion of all joints Grip strength is equal and strong bilaterally Fiber myalgia tender points are 18 out of 18 positive with pain to bilateral greater trochanter bursa  CDAI Exam: No CDAI exam completed.  No synovitis on examination  Investigation: Findings:  Patient was doing well with Fibromyalgia Syndrome, until bending wrong and having muscle spasm.   Appointment on 10/09/2015  Component Date Value Ref Range Status  . HCV Ab 10/09/2015 NEGATIVE  NEGATIVE Final  . Cholesterol  10/09/2015 240* 0 - 200 mg/dL Final  . Triglycerides 10/09/2015 86.0  0.0 - 149.0 mg/dL Final  . HDL 10/09/2015 69.20  >39.00 mg/dL Final  . VLDL 10/09/2015 17.2  0.0 - 40.0 mg/dL Final  . LDL Cholesterol 10/09/2015 154* 0 - 99 mg/dL Final  . Total CHOL/HDL Ratio 10/09/2015 3   Final  . NonHDL 10/09/2015 170.83   Final  . Sodium 10/09/2015 139  135 - 145 mEq/L Final  . Potassium 10/09/2015 4.4  3.5 - 5.1 mEq/L Final  . Chloride 10/09/2015 102  96 - 112 mEq/L Final  . CO2 10/09/2015 35* 19 - 32 mEq/L Final  . Glucose, Bld 10/09/2015 98  70 - 99 mg/dL Final  . BUN 10/09/2015 11  6 - 23 mg/dL Final  . Creatinine, Ser 10/09/2015 0.63  0.40 - 1.20 mg/dL Final  . Calcium  10/09/2015 10.2  8.4 - 10.5 mg/dL Final  . GFR 10/09/2015 124.70  >60.00 mL/min Final  . Total Bilirubin 10/09/2015 0.3  0.2 - 1.2 mg/dL Final  . Bilirubin, Direct 10/09/2015 -0.1* 0.0 - 0.3 mg/dL Final  . Alkaline Phosphatase 10/09/2015 92  39 - 117 U/L Final  . AST 10/09/2015 13  0 - 37 U/L Final  . ALT 10/09/2015 15  0 - 35 U/L Final  . Total Protein 10/09/2015 7.2  6.0 - 8.3 g/dL Final  . Albumin 10/09/2015 3.7  3.5 - 5.2 g/dL Final  . WBC 10/09/2015 6.2  4.0 - 10.5 K/uL Final  . RBC 10/09/2015 4.36  3.87 - 5.11 Mil/uL Final  . Hemoglobin 10/09/2015 11.8* 12.0 - 15.0 g/dL Final  . HCT 10/09/2015 35.9* 36.0 - 46.0 % Final  . MCV 10/09/2015 82.4  78.0 - 100.0 fl Final  . MCHC 10/09/2015 32.8  30.0 - 36.0 g/dL Final  . RDW 10/09/2015 14.8  11.5 - 15.5 % Final  . Platelets 10/09/2015 365.0  150.0 - 400.0 K/uL Final  . Neutrophils Relative % 10/09/2015 66.5  43.0 - 77.0 % Final  . Lymphocytes Relative 10/09/2015 26.2  12.0 - 46.0 % Final  . Monocytes Relative 10/09/2015 6.4  3.0 - 12.0 % Final  . Eosinophils Relative 10/09/2015 0.5  0.0 - 5.0 % Final  . Basophils Relative 10/09/2015 0.4  0.0 - 3.0 % Final  . Neutro Abs 10/09/2015 4.1  1.4 - 7.7 K/uL Final  . Lymphs Abs 10/09/2015 1.6  0.7 - 4.0 K/uL Final  . Monocytes  Absolute 10/09/2015 0.4  0.1 - 1.0 K/uL Final  . Eosinophils Absolute 10/09/2015 0.0  0.0 - 0.7 K/uL Final  . Basophils Absolute 10/09/2015 0.0  0.0 - 0.1 K/uL Final  . TSH 10/09/2015 0.73  0.35 - 4.50 uIU/mL Final  . Color, Urine 10/09/2015 YELLOW  Yellow;Lt. Yellow Final  . APPearance 10/09/2015 CLEAR  Clear Final  . Specific Gravity, Urine 10/09/2015 1.025  1.000 - 1.030 Final  . pH 10/09/2015 6.0  5.0 - 8.0 Final  . Total Protein, Urine 10/09/2015 NEGATIVE  Negative Final  . Urine Glucose 10/09/2015 NEGATIVE  Negative Final  . Ketones, ur 10/09/2015 TRACE* Negative Final  . Bilirubin Urine 10/09/2015 NEGATIVE  Negative Final  . Hgb urine dipstick 10/09/2015 NEGATIVE  Negative Final  . Urobilinogen, UA 10/09/2015 0.2  0.0 - 1.0 Final  . Leukocytes, UA 10/09/2015 NEGATIVE  Negative Final  . Nitrite 10/09/2015 NEGATIVE  Negative Final  . WBC, UA 10/09/2015 0-2/hpf  0-2/hpf Final  . RBC / HPF 10/09/2015 0-2/hpf  0-2/hpf Final  . Squamous Epithelial / LPF 10/09/2015 Few(5-10/hpf)* Rare(0-4/hpf) Final      Imaging: No results found.  Speciality Comments: No specialty comments available.    Procedures:  Large Joint Inj Date/Time: 03/26/2016 1:46 PM Performed by: Eliezer Lofts Authorized by: Eliezer Lofts   Consent Given by:  Patient Site marked: the procedure site was marked   Timeout: prior to procedure the correct patient, procedure, and site was verified   Indications:  Pain Location:  Hip Site:  R greater trochanter Prep: patient was prepped and draped in usual sterile fashion   Needle Size:  27 G Approach:  Superior Ultrasound Guidance: No   Fluoroscopic Guidance: No   Arthrogram: No   Medications:  1.5 mL lidocaine 1 %; 40 mg triamcinolone acetonide 40 MG/ML Aspiration Attempted: Yes   Aspirate amount (mL):  0 Patient tolerance:  Patient tolerated the procedure  well with no immediate complications Large Joint Inj Date/Time: 03/26/2016 1:47 PM Performed by:  Eliezer Lofts Authorized by: Eliezer Lofts   Consent Given by:  Patient Site marked: the procedure site was marked   Timeout: prior to procedure the correct patient, procedure, and site was verified   Indications:  Pain Location:  Hip Site:  L greater trochanter Prep: patient was prepped and draped in usual sterile fashion   Needle Size:  27 G Approach:  Superior Ultrasound Guidance: No   Fluoroscopic Guidance: No   Arthrogram: No   Medications:  1.5 mL lidocaine 1 %; 40 mg triamcinolone acetonide 40 MG/ML Aspiration Attempted: Yes   Aspirate amount (mL):  0 Patient tolerance:  Patient tolerated the procedure well with no immediate complications   Allergies: Duloxetine and Penicillins   Assessment / Plan:     Visit Diagnoses: Acute left-sided low back pain, with sciatica presence unspecified  Pain, joint, multiple sites  Fibromyalgia syndrome  History of fatigue - Plan: CBC with Differential/Platelet, COMPLETE METABOLIC PANEL WITH GFR  History of insomnia  History of bilateral knee replacement  Greater trochanteric bursitis of both hips - Plan: Large Joint Injection/Arthrocentesis   Plan: #1: Fibromyalgia. Active disease with generalized pain and 18 out of 18 tender points. I believe the patient is flaring in her fibromyalgia now secondary to the left radiculopathy. She could have had a successful surgery but she was required by her insurance to go back to work 4 weeks earlier than advised by the Psychologist, sport and exercise.  #2: Bilateral greater trochanteric bursitis. Please see procedure note for full details  #3: Patient wants her medications refill: Robaxin and Lyrica; ninety-day supply with a refill  #4: Return to clinic in 6 months  #7: Patient will get bilateral shoulder joint injection at [facility]. They're planning on doing it in April  Orders: Orders Placed This Encounter  Procedures  . Large Joint Injection/Arthrocentesis  . Large Joint  Injection/Arthrocentesis  . Large Joint Injection/Arthrocentesis  . CBC with Differential/Platelet  . COMPLETE METABOLIC PANEL WITH GFR   Meds ordered this encounter  Medications  . pregabalin (LYRICA) 150 MG capsule    Sig: Take 1 capsule (150 mg total) by mouth 3 (three) times daily.    Dispense:  270 capsule    Refill:  1    Order Specific Question:   Supervising Provider    Answer:   Bo Merino [[id number]]  . methocarbamol (ROBAXIN) 500 MG tablet    Sig: TAKE 1 TABLET BY MOUTH TWICE A DAY AT 7 AM AND 2 PM    Dispense:  180 tablet    Refill:  1    Order Specific Question:   Supervising Provider    Answer:   Lyda Perone    Face-to-face time spent with patient was 30 minutes. 50% of time was spent in counseling and coordination of care.  Follow-Up Instructions: Return in about 6 months (around 09/26/2016).   Eliezer Lofts, PA-C  Note - This record has been created using [employer].  Chart creation errors have been sought, but may not always  have been located. Such creation errors do not reflect on  the standard of medical care.

## 2016-03-26 ENCOUNTER — Encounter: Payer: Self-pay | Admitting: Rheumatology

## 2016-03-26 ENCOUNTER — Ambulatory Visit (INDEPENDENT_AMBULATORY_CARE_PROVIDER_SITE_OTHER): Payer: 59 | Admitting: Rheumatology

## 2016-03-26 VITALS — BP 138/78 | HR 78 | Resp 16 | Ht 63.0 in | Wt 183.0 lb

## 2016-03-26 DIAGNOSIS — M2550 Pain in unspecified joint: Secondary | ICD-10-CM | POA: Diagnosis not present

## 2016-03-26 DIAGNOSIS — M7061 Trochanteric bursitis, right hip: Secondary | ICD-10-CM

## 2016-03-26 DIAGNOSIS — M797 Fibromyalgia: Secondary | ICD-10-CM

## 2016-03-26 DIAGNOSIS — M545 Low back pain: Secondary | ICD-10-CM

## 2016-03-26 DIAGNOSIS — Z96653 Presence of artificial knee joint, bilateral: Secondary | ICD-10-CM

## 2016-03-26 DIAGNOSIS — Z87898 Personal history of other specified conditions: Secondary | ICD-10-CM | POA: Diagnosis not present

## 2016-03-26 DIAGNOSIS — M7062 Trochanteric bursitis, left hip: Secondary | ICD-10-CM | POA: Diagnosis not present

## 2016-03-26 LAB — CBC WITH DIFFERENTIAL/PLATELET
BASOS ABS: 41 {cells}/uL (ref 0–200)
Basophils Relative: 1 %
EOS ABS: 82 {cells}/uL (ref 15–500)
EOS PCT: 2 %
HEMATOCRIT: 38.1 % (ref 35.0–45.0)
HEMOGLOBIN: 12.2 g/dL (ref 11.7–15.5)
LYMPHS ABS: 1599 {cells}/uL (ref 850–3900)
Lymphocytes Relative: 39 %
MCH: 26.8 pg — AB (ref 27.0–33.0)
MCHC: 32 g/dL (ref 32.0–36.0)
MCV: 83.7 fL (ref 80.0–100.0)
MONO ABS: 369 {cells}/uL (ref 200–950)
MPV: 9.9 fL (ref 7.5–12.5)
Monocytes Relative: 9 %
NEUTROS ABS: 2009 {cells}/uL (ref 1500–7800)
Neutrophils Relative %: 49 %
Platelets: 356 10*3/uL (ref 140–400)
RBC: 4.55 MIL/uL (ref 3.80–5.10)
RDW: 13.8 % (ref 11.0–15.0)
WBC: 4.1 10*3/uL (ref 3.8–10.8)

## 2016-03-26 LAB — COMPLETE METABOLIC PANEL WITH GFR
ALBUMIN: 4.1 g/dL (ref 3.6–5.1)
ALK PHOS: 118 U/L (ref 33–130)
ALT: 17 U/L (ref 6–29)
AST: 22 U/L (ref 10–35)
BILIRUBIN TOTAL: 0.4 mg/dL (ref 0.2–1.2)
BUN: 8 mg/dL (ref 7–25)
CO2: 25 mmol/L (ref 20–31)
Calcium: 10.3 mg/dL (ref 8.6–10.4)
Chloride: 104 mmol/L (ref 98–110)
Creat: 0.69 mg/dL (ref 0.50–1.05)
GFR, Est African American: >89 mL/min (ref >=60)
GLUCOSE: 95 mg/dL (ref 65–99)
Potassium: 4.6 mmol/L (ref 3.5–5.3)
SODIUM: 139 mmol/L (ref 135–146)
TOTAL PROTEIN: 7.3 g/dL (ref 6.1–8.1)

## 2016-03-26 MED ORDER — LIDOCAINE HCL 1 % IJ SOLN
1.5000 mL | INTRAMUSCULAR | Status: AC | PRN
  Administered 2016-03-26: 1.5 mL

## 2016-03-26 MED ORDER — LIDOCAINE HCL 1 % IJ SOLN
1.5000 mL | INTRAMUSCULAR | Status: AC | PRN
Start: 2016-03-26 — End: 2016-03-26
  Administered 2016-03-26: 1.5 mL

## 2016-03-26 MED ORDER — TRIAMCINOLONE ACETONIDE 40 MG/ML IJ SUSP
40.0000 mg | INTRAMUSCULAR | Status: AC | PRN
  Administered 2016-03-26: 40 mg via INTRA_ARTICULAR

## 2016-03-26 MED ORDER — PREGABALIN 150 MG PO CAPS: 150.0000 mg | ORAL_CAPSULE | Freq: Three times a day (TID) | ORAL | 1 refills | Status: DC

## 2016-03-26 MED ORDER — METHOCARBAMOL 500 MG PO TABS: ORAL_TABLET | ORAL | 1 refills | Status: DC

## 2016-03-29 ENCOUNTER — Telehealth: Payer: Self-pay | Admitting: Radiology

## 2016-03-29 NOTE — Telephone Encounter (Signed)
-----   Message from Eliezer Lofts, [place] sent at 03/29/2016  8:38 AM EDT ----- Send copy of labs to PCP And tell patient CMP with GFR is normal CBC with differential is within normal limits.

## 2016-03-29 NOTE — Telephone Encounter (Signed)
Routed to PCP and called patient to advise of normal labs.

## 2016-03-31 ENCOUNTER — Telehealth: Payer: Self-pay | Admitting: Internal Medicine

## 2016-03-31 NOTE — Telephone Encounter (Signed)
OK 

## 2016-03-31 NOTE — Telephone Encounter (Signed)
Left message informing patient of this.

## 2016-04-06 ENCOUNTER — Other Ambulatory Visit: Payer: Self-pay | Admitting: Rheumatology

## 2016-04-06 MED ORDER — METHOCARBAMOL 500 MG PO TABS: ORAL_TABLET | ORAL | 1 refills | Status: DC

## 2016-04-06 NOTE — Telephone Encounter (Signed)
Please call in Robaxin.  Patient called  Gabrielle Benson and was told she would need to contact Provider. Can you send in again?

## 2016-04-06 NOTE — Telephone Encounter (Signed)
Patient calling for refill on Robaxin. Please call in @ [facility]

## 2016-04-06 NOTE — Telephone Encounter (Signed)
[facility] / Gabrielle Benson sent in for her previously but apparently it was not received. I called patient to advise, but was asked to hold and no one picked up after several minutes.

## 2016-04-09 DIAGNOSIS — M19012 Primary osteoarthritis, left shoulder: Secondary | ICD-10-CM | POA: Diagnosis not present

## 2016-04-09 DIAGNOSIS — M19011 Primary osteoarthritis, right shoulder: Secondary | ICD-10-CM | POA: Diagnosis not present

## 2016-07-14 DIAGNOSIS — M5412 Radiculopathy, cervical region: Secondary | ICD-10-CM | POA: Diagnosis not present

## 2016-07-14 DIAGNOSIS — M19012 Primary osteoarthritis, left shoulder: Secondary | ICD-10-CM | POA: Diagnosis not present

## 2016-07-20 ENCOUNTER — Other Ambulatory Visit: Payer: Self-pay | Admitting: Internal Medicine

## 2016-07-29 ENCOUNTER — Telehealth: Payer: Self-pay | Admitting: Radiology

## 2016-07-29 ENCOUNTER — Telehealth (INDEPENDENT_AMBULATORY_CARE_PROVIDER_SITE_OTHER): Payer: Self-pay

## 2016-07-29 ENCOUNTER — Other Ambulatory Visit: Payer: Self-pay | Admitting: Rheumatology

## 2016-07-29 NOTE — Telephone Encounter (Signed)
Patient was returning your call concerning a Rx refill.  CB# is [phone].  Thank you.

## 2016-07-29 NOTE — Telephone Encounter (Signed)
Refill request received today for a steroid taper. I have called patient to ask about this. We can not renew a steroid taper without knowing what is going on with her. Left message for her to call back regarding this.   She is treated here for FMS

## 2016-07-30 NOTE — Telephone Encounter (Signed)
Attempted to contact the patient and left message for patient to call the office.  

## 2016-08-02 ENCOUNTER — Telehealth: Payer: Self-pay | Admitting: Rheumatology

## 2016-08-02 NOTE — Telephone Encounter (Signed)
She may request [facility] for a prednisone taper.

## 2016-08-02 NOTE — Telephone Encounter (Signed)
Patient states she is having inflammation in her neck and her shoulder. Patient states it is causing numbness in her arm. Patient is seeing [facility] for the problem in her neck. Patient is trying to avoid having surgery. Patient is wanting to know if she can be put on prednisone for an extended period of time. Patient states the prednisone has helped with her inflammation  And being able to function at work. Please advise

## 2016-08-02 NOTE — Telephone Encounter (Signed)
Patient advised to contact [facility] for prednisone taper. Patient verbalized understanding.

## 2016-08-02 NOTE — Telephone Encounter (Signed)
Patient returned your call.

## 2016-08-02 NOTE — Telephone Encounter (Signed)
See previous phone note.  

## 2016-08-13 DIAGNOSIS — M19012 Primary osteoarthritis, left shoulder: Secondary | ICD-10-CM | POA: Diagnosis not present

## 2016-08-13 DIAGNOSIS — M5412 Radiculopathy, cervical region: Secondary | ICD-10-CM | POA: Diagnosis not present

## 2016-08-20 DIAGNOSIS — M5412 Radiculopathy, cervical region: Secondary | ICD-10-CM | POA: Diagnosis not present

## 2016-08-20 DIAGNOSIS — Z1231 Encounter for screening mammogram for malignant neoplasm of breast: Secondary | ICD-10-CM | POA: Diagnosis not present

## 2016-08-20 LAB — HM MAMMOGRAPHY

## 2016-08-24 ENCOUNTER — Emergency Department (HOSPITAL_COMMUNITY)
Admission: EM | Admit: 2016-08-24 | Discharge: 2016-08-24 | Disposition: A | Discharge status: Home / Self Care | Payer: 59 | Attending: Physician Assistant | Admitting: Physician Assistant

## 2016-08-24 ENCOUNTER — Encounter (HOSPITAL_COMMUNITY): Payer: Self-pay | Admitting: Emergency Medicine

## 2016-08-24 DIAGNOSIS — E785 Hyperlipidemia, unspecified: Secondary | ICD-10-CM | POA: Diagnosis not present

## 2016-08-24 DIAGNOSIS — M5410 Radiculopathy, site unspecified: Secondary | ICD-10-CM | POA: Insufficient documentation

## 2016-08-24 DIAGNOSIS — Z79899 Other long term (current) drug therapy: Secondary | ICD-10-CM | POA: Insufficient documentation

## 2016-08-24 DIAGNOSIS — D649 Anemia, unspecified: Secondary | ICD-10-CM | POA: Insufficient documentation

## 2016-08-24 DIAGNOSIS — M792 Neuralgia and neuritis, unspecified: Secondary | ICD-10-CM

## 2016-08-24 DIAGNOSIS — M79602 Pain in left arm: Secondary | ICD-10-CM | POA: Diagnosis not present

## 2016-08-24 DIAGNOSIS — G8929 Other chronic pain: Secondary | ICD-10-CM | POA: Diagnosis not present

## 2016-08-24 MED ORDER — KETOROLAC TROMETHAMINE 60 MG/2ML IM SOLN
60.0000 mg | Freq: Once | INTRAMUSCULAR | Status: AC
  Administered 2016-08-24: 60 mg via INTRAMUSCULAR
  Filled 2016-08-24: qty 2

## 2016-08-24 MED ORDER — METHOCARBAMOL 500 MG PO TABS: 500.0000 mg | ORAL_TABLET | Freq: Every evening | ORAL | 0 refills | Status: DC | PRN

## 2016-08-24 MED ORDER — MELOXICAM 15 MG PO TABS: 15.0000 mg | ORAL_TABLET | Freq: Every day | ORAL | 0 refills | Status: DC

## 2016-08-24 MED ORDER — DEXAMETHASONE SODIUM PHOSPHATE 10 MG/ML IJ SOLN
10.0000 mg | Freq: Once | INTRAMUSCULAR | Status: AC
  Administered 2016-08-24: 10 mg via INTRAMUSCULAR
  Filled 2016-08-24: qty 1

## 2016-08-24 NOTE — ED Triage Notes (Signed)
Pt has history of cervical DDD and has been having chromic issues with inflammation causing left arm numbness that has been getting worse.

## 2016-08-24 NOTE — ED Provider Notes (Signed)
[place] DEPT Provider Note   CSN: [id number] Arrival date & time: 08/24/16  1735  By signing my name below, I, Reola Mosher, attest that this documentation has been prepared under the direction and in the presence of Janetta Hora, PA-C.  Electronically Signed: Reola Mosher, ED Scribe. 08/24/16. 7:00 PM.  History   Chief Complaint Chief Complaint  Patient presents with  . Arm Pain   The history is provided by the patient. No language interpreter was used.    HPI Comments: Gabrielle Benson is a 61 y.o. female with a PMHx of cervical DDD, chronic cervicalgia, fibromyalgia, chronic lower back pain w/ sciatica, chronic pain syndrome, peripheral neuropathy, who presents to the Emergency Department complaining of persistent, acute on chronic left-sided neck, shoulder, and arm pain beginning ~4 weeks ago, acutely worsening over the past several days. She notes some numbness and paraesthesias over the forearm and into the left hand which is also a baseline issue for her. Pt reports that she has a h/o DDD involving C4-C6; she reports that d/t this she has had ongoing left arm pain, however, her baseline pain began to acutely worsen several weeks. She relates this to the recent weather changes. She also notes that certain movements of the neck and left arm will acutely worsen her pain. Additionally, she states that approximately four weeks ago she was seen by a PA at her orthopedist's office who prescribed a steroid dose pack which improved her pain temporarily. She is not currently prescribed anything for her chronic pain; she has f/u w/ her pain management specialist three days from now. She denies bowel/bladder incontinence, saddle anaesthesia/paraesthesias, fever, or any other associated symptoms.   Past Medical History:  Diagnosis Date  . Allergic rhinitis   . ANA positive   . Anemia    Iron deficiency  . Anxiety   . Cervicalgia    Chronic  . Complication of anesthesia     hard to go to sleep with Anesthesia-had to have Benadryl with Colonoscopy  . DDD (degenerative disc disease)    Cervical  . DJD (degenerative joint disease)   . Fibromyalgia   . GERD (gastroesophageal reflux disease)   . Hyperlipidemia   . Multinodular thyroid   . Neuropathy, peripheral   . Pneumonia   . Vitamin D deficiency 03/18/2011   Patient Active Problem List   Diagnosis Date Noted  . Acute left-sided low back pain 03/25/2016  . Pain, joint, multiple sites 03/25/2016  . History of insomnia 03/25/2016  . Greater trochanteric bursitis of both hips 03/25/2016  . Wheezing 02/18/2016  . Chronic pain syndrome 10/11/2015  . Memory dysfunction 04/01/2015  . History of bilateral knee replacement 10/07/2014  . Cough 11/02/2013  . Herpes labialis 11/02/2013  . GERD (gastroesophageal reflux disease) 11/02/2012  . Anxiety 07/01/2011  . Vitamin D deficiency 03/18/2011  . Encounter for well adult exam with abnormal findings 03/13/2011  . PERIPHERAL NEUROPATHY 10/03/2007  . Hyperlipidemia 01/05/2007  . ANEMIA-IRON DEFICIENCY 01/05/2007  . INSOMNIA-SLEEP DISORDER-UNSPEC 01/05/2007  . Allergic rhinitis 01/05/2007  . CONSTIPATION, CHRONIC 01/05/2007  . Fibromyalgia syndrome 01/05/2007   Past Surgical History:  Procedure Laterality Date  . ABDOMINAL HYSTERECTOMY    . ABDOMINAL HYSTERECTOMY     partial  . BUNIONECTOMY  2011   left foot  . DILATION AND CURETTAGE OF UTERUS    . KNEE ARTHROSCOPY  2004   right  . MYOMECTOMY  2002   prior to hysterectomy  . PARTIAL KNEE ARTHROPLASTY Right 10/07/2014  Procedure: RIGHT UNI KNEE ARTHROPLASTY LATERALLY ;  Surgeon: Paralee Cancel, MD;  Location: [facility];  Service: Orthopedics;  Laterality: Right;  . TONSILLECTOMY     age 7   OB History    No data available     Home Medications    Prior to Admission medications   Medication Sig Start Date End Date Taking? Authorizing Provider  atorvastatin (LIPITOR) 20 MG tablet Take 1 tablet (20  mg total) by mouth daily. 10/09/15 10/08/16  [name], MD  diclofenac sodium (VOLTAREN) 1 % GEL Apply 2 g topically 4 (four) times daily.    [provider]  estrogens, conjugated, (PREMARIN) 0.625 MG tablet Take 0.625 mg by mouth daily.    [provider]  Menthol, Topical Analgesic, (BIOFREEZE EX) Apply 1 application topically daily.    [provider]  methocarbamol (ROBAXIN) 500 MG tablet TAKE 1 TABLET BY MOUTH TWICE A DAY AT 7 AM AND 2 PM 04/06/16   Panwala, Naitik, PA-C  naproxen sodium (ANAPROX) 220 MG tablet Take 220 mg by mouth 2 (two) times daily with a meal.    [provider]  pantoprazole (PROTONIX) 40 MG tablet TAKE 1 TABLET EVERY DAY 07/20/16   [name], MD  pregabalin (LYRICA) 150 MG capsule Take 1 capsule (150 mg total) by mouth 3 (three) times daily. 03/26/16 09/22/16  Eliezer Lofts, PA-C   Family History Family History  Problem Relation Age of Onset  . Heart disease Father   . Sickle cell trait Unknown   . Lung cancer Unknown   . Colon cancer Unknown        Aunt  . Diabetes Mother   . Stomach cancer Neg Hx   . Rectal cancer Neg Hx   . Esophageal cancer Neg Hx    Social History Social History  Substance Use Topics  . Smoking status: Never Smoker  . Smokeless tobacco: Never Used  . Alcohol use 0.0 oz/week    3 - 4 Glasses of wine per week   Allergies   Duloxetine and Penicillins  Review of Systems Review of Systems  Constitutional: Negative for fever.  Musculoskeletal: Positive for arthralgias, myalgias and neck pain.  Neurological: Positive for numbness.   Physical Exam Updated Vital Signs BP (!) 195/98 (BP Location: Right Arm)   Pulse 88   Temp 98.2 F (36.8 C) (Oral)   Resp 18   SpO2 98%   Physical Exam  Constitutional: She is oriented to person, place, and time. She appears well-developed and well-nourished. No distress.  HENT:  Head: Normocephalic and atraumatic.  Eyes: Pupils are equal, round, and  reactive to light. Conjunctivae are normal. Right eye exhibits no discharge. Left eye exhibits no discharge. No scleral icterus.  Neck: Normal range of motion.  Midline tenderness and left sided cervical paraspinal muscle tenderness  Cardiovascular: Normal rate.   Pulmonary/Chest: Effort normal. No respiratory distress.  Abdominal: She exhibits no distension.  Musculoskeletal: Normal range of motion.  5/5 upper extremity strength  Neurological: She is alert and oriented to person, place, and time.  Skin: Skin is warm and dry. No pallor.  Psychiatric: She has a normal mood and affect. Her behavior is normal.  Nursing note and vitals reviewed.  ED Treatments / Results  DIAGNOSTIC STUDIES: Oxygen Saturation is 98% on RA, normal by my interpretation.   COORDINATION OF CARE: 6:59 PM-Discussed next steps with pt. Pt verbalized understanding and is agreeable with the plan.   Labs (all labs ordered are  listed, but only abnormal results are displayed) Labs Reviewed - No data to display  EKG  EKG Interpretation None      Radiology No results found.  Procedures Procedures   Medications Ordered in ED Medications - No data to display  Initial Impression / Assessment and Plan / ED Course  I have reviewed the triage vital signs and the nursing notes.  Pertinent labs & imaging results that were available during my care of the patient were reviewed by me and considered in my medical decision making (see chart for details).  61 year old with acute on chronic neck pain with radiculopathy. No weakness on exam. Will treat with steroid injection, Mobic, and muscle relaxers. Discussed supportive care as well. She has a follow up with pain management later this week. Return precautions given.  Final Clinical Impressions(s) / ED Diagnoses   Final diagnoses:  Radicular pain in left arm   New Prescriptions Discharge Medication List as of 08/24/2016  7:04 PM    START taking these  medications   Details  meloxicam (MOBIC) 15 MG tablet Take 1 tablet (15 mg total) by mouth daily., Starting Tue 08/24/2016, Print       I personally performed the services described in this documentation, which was scribed in my presence. The recorded information has been reviewed and is accurate.     Recardo Evangelist, PA-C 08/24/16 2338    Macarthur Critchley, MD 08/24/16 [phone]

## 2016-08-24 NOTE — Discharge Instructions (Signed)
Take Meloxicam once a day Take Robaxin (muscle relaxer) as needed Follow up with pain management

## 2016-08-27 DIAGNOSIS — M5020 Other cervical disc displacement, unspecified cervical region: Secondary | ICD-10-CM | POA: Diagnosis not present

## 2016-08-27 DIAGNOSIS — M50123 Cervical disc disorder at C6-C7 level with radiculopathy: Secondary | ICD-10-CM | POA: Diagnosis not present

## 2016-08-27 DIAGNOSIS — M5412 Radiculopathy, cervical region: Secondary | ICD-10-CM | POA: Diagnosis not present

## 2016-08-28 ENCOUNTER — Encounter (HOSPITAL_COMMUNITY): Payer: Self-pay | Admitting: Emergency Medicine

## 2016-08-28 ENCOUNTER — Emergency Department (HOSPITAL_COMMUNITY)
Admission: EM | Admit: 2016-08-28 | Discharge: 2016-08-28 | Disposition: A | Discharge status: Home / Self Care | Payer: 59 | Attending: Emergency Medicine | Admitting: Emergency Medicine

## 2016-08-28 DIAGNOSIS — I10 Essential (primary) hypertension: Secondary | ICD-10-CM | POA: Diagnosis not present

## 2016-08-28 DIAGNOSIS — Z79899 Other long term (current) drug therapy: Secondary | ICD-10-CM | POA: Diagnosis not present

## 2016-08-28 DIAGNOSIS — M5412 Radiculopathy, cervical region: Secondary | ICD-10-CM | POA: Diagnosis not present

## 2016-08-28 DIAGNOSIS — M549 Dorsalgia, unspecified: Secondary | ICD-10-CM | POA: Diagnosis present

## 2016-08-28 LAB — CBC WITH DIFFERENTIAL/PLATELET
BASOS ABS: 0 10*3/uL (ref 0.0–0.1)
Basophils Relative: 0 %
EOS PCT: 1 %
Eosinophils Absolute: 0.1 10*3/uL (ref 0.0–0.7)
HEMATOCRIT: 36 % (ref 36.0–46.0)
Hemoglobin: 11.3 g/dL — ABNORMAL LOW (ref 12.0–15.0)
LYMPHS ABS: 1.7 10*3/uL (ref 0.7–4.0)
LYMPHS PCT: 27 %
MCH: 26.8 pg (ref 26.0–34.0)
MCHC: 31.4 g/dL (ref 30.0–36.0)
MCV: 85.5 fL (ref 78.0–100.0)
MONO ABS: 0.4 10*3/uL (ref 0.1–1.0)
Monocytes Relative: 7 %
NEUTROS ABS: 3.9 10*3/uL (ref 1.7–7.7)
NEUTROS PCT: 65 %
PLATELETS: 397 10*3/uL (ref 150–400)
RBC: 4.21 MIL/uL (ref 3.87–5.11)
RDW: 13.7 % (ref 11.5–15.5)
WBC: 6.1 10*3/uL (ref 4.0–10.5)

## 2016-08-28 LAB — BASIC METABOLIC PANEL
ANION GAP: 8 (ref 5–15)
BUN: 10 mg/dL (ref 6–20)
CALCIUM: 9.8 mg/dL (ref 8.9–10.3)
CO2: 27 mmol/L (ref 22–32)
Chloride: 104 mmol/L (ref 101–111)
Creatinine, Ser: 0.58 mg/dL (ref 0.44–1.00)
GFR calc Af Amer: >60 mL/min (ref >60)
GLUCOSE: 134 mg/dL — AB (ref 65–99)
POTASSIUM: 3.6 mmol/L (ref 3.5–5.1)
Sodium: 139 mmol/L (ref 135–145)

## 2016-08-28 MED ORDER — DIAZEPAM 5 MG PO TABS: 5.0000 mg | ORAL_TABLET | Freq: Four times a day (QID) | ORAL | 0 refills | Status: DC | PRN

## 2016-08-28 MED ORDER — HYDROMORPHONE HCL 1 MG/ML IJ SOLN
1.0000 mg | Freq: Once | INTRAMUSCULAR | Status: AC
  Administered 2016-08-28: 1 mg via INTRAVENOUS
  Filled 2016-08-28: qty 1

## 2016-08-28 MED ORDER — KETOROLAC TROMETHAMINE 30 MG/ML IJ SOLN
30.0000 mg | Freq: Once | INTRAMUSCULAR | Status: AC
  Administered 2016-08-28: 30 mg via INTRAVENOUS
  Filled 2016-08-28: qty 1

## 2016-08-28 MED ORDER — DIAZEPAM 5 MG/ML IJ SOLN
5.0000 mg | Freq: Once | INTRAMUSCULAR | Status: AC
  Administered 2016-08-28: 5 mg via INTRAVENOUS
  Filled 2016-08-28: qty 2

## 2016-08-28 NOTE — Discharge Instructions (Signed)
Continue your current meds.   Add valium as needed for muscle spasms.   Call Dr. Valla Leaver office on Monday for appointment   Return to ER if you have worse numbness, weakness, fever, vomiting, chest pain.

## 2016-08-28 NOTE — ED Triage Notes (Addendum)
Pt states here for a stronger pain mediation- here with results of MRI that was done Friday, shows she needs surgery for bulging disk. Pt was given an injection and percocet. Pt states pain is 10/10 and she needs some pain relief.  Pt has been taking Percocet.

## 2016-08-28 NOTE — ED Provider Notes (Signed)
[facility] DEPT Provider Note   CSN: [id number] Arrival date & time: 08/28/16  5176     History   Chief Complaint Chief Complaint  Patient presents with  . Back Pain    HPI Gabrielle Benson is a 61 y.o. female history of cervical radiculopathy, fibromyalgia, hyperlipidemia here presenting with left arm pain and tingling. She has known cervical radiculopathy and actually has been followed up with Dr. Rolena Infante and has a pain medicine doctor as well. Patient actually had an MRI done yesterday that showed no impingement on the cord, just C6-7 L peripheral nerve impingement. Patient was seen in the ED last week as well and was given pain medicine and sent home with Motrin, steroids. Patient states that her pain medicine doctor called in some prescription Percocet and she's been taking it with no relief. She states that she has some occasional numbness and tingling down the left arm but that is chronic. Patient denies any weakness. Denies any incontinence.   The history is provided by the patient.    Past Medical History:  Diagnosis Date  . Allergic rhinitis   . ANA positive   . Anemia    Iron deficiency  . Anxiety   . Cervicalgia    Chronic  . Complication of anesthesia    hard to go to sleep with Anesthesia-had to have Benadryl with Colonoscopy  . DDD (degenerative disc disease)    Cervical  . DJD (degenerative joint disease)   . Fibromyalgia   . GERD (gastroesophageal reflux disease)   . Hyperlipidemia   . Multinodular thyroid   . Neuropathy, peripheral   . Pneumonia   . Vitamin D deficiency 03/18/2011    Patient Active Problem List   Diagnosis Date Noted  . Acute left-sided low back pain 03/25/2016  . Pain, joint, multiple sites 03/25/2016  . History of insomnia 03/25/2016  . Greater trochanteric bursitis of both hips 03/25/2016  . Wheezing 02/18/2016  . Chronic pain syndrome 10/11/2015  . Memory dysfunction 04/01/2015  . History of bilateral knee replacement  10/07/2014  . Cough 11/02/2013  . Herpes labialis 11/02/2013  . GERD (gastroesophageal reflux disease) 11/02/2012  . Anxiety 07/01/2011  . Vitamin D deficiency 03/18/2011  . Encounter for well adult exam with abnormal findings 03/13/2011  . PERIPHERAL NEUROPATHY 10/03/2007  . Hyperlipidemia 01/05/2007  . ANEMIA-IRON DEFICIENCY 01/05/2007  . INSOMNIA-SLEEP DISORDER-UNSPEC 01/05/2007  . Allergic rhinitis 01/05/2007  . CONSTIPATION, CHRONIC 01/05/2007  . Fibromyalgia syndrome 01/05/2007    Past Surgical History:  Procedure Laterality Date  . ABDOMINAL HYSTERECTOMY    . ABDOMINAL HYSTERECTOMY     partial  . BUNIONECTOMY  2011   left foot  . DILATION AND CURETTAGE OF UTERUS    . KNEE ARTHROSCOPY  2004   right  . MYOMECTOMY  2002   prior to hysterectomy  . PARTIAL KNEE ARTHROPLASTY Right 10/07/2014   Procedure: RIGHT UNI KNEE ARTHROPLASTY LATERALLY ;  Surgeon: Paralee Cancel, MD;  Location: [facility];  Service: Orthopedics;  Laterality: Right;  . TONSILLECTOMY     age 76    OB History    No data available       Home Medications    Prior to Admission medications   Medication Sig Start Date End Date Taking? Authorizing Provider  atorvastatin (LIPITOR) 20 MG tablet Take 1 tablet (20 mg total) by mouth daily. 10/09/15 10/08/16  [name], MD  diclofenac sodium (VOLTAREN) 1 % GEL Apply 2 g topically 4 (four) times daily.  [provider]  estrogens, conjugated, (PREMARIN) 0.625 MG tablet Take 0.625 mg by mouth daily.    [provider]  meloxicam (MOBIC) 15 MG tablet Take 1 tablet (15 mg total) by mouth daily. 08/24/16   Recardo Evangelist, PA-C  Menthol, Topical Analgesic, (BIOFREEZE EX) Apply 1 application topically daily.    [provider]  methocarbamol (ROBAXIN) 500 MG tablet Take 1 tablet (500 mg total) by mouth at bedtime and may repeat dose one time if needed. 08/24/16   Recardo Evangelist, PA-C  naproxen sodium (ANAPROX) 220 MG tablet Take 220 mg  by mouth 2 (two) times daily with a meal.    [provider]  pantoprazole (PROTONIX) 40 MG tablet TAKE 1 TABLET EVERY DAY 07/20/16   [name], MD  pregabalin (LYRICA) 150 MG capsule Take 1 capsule (150 mg total) by mouth 3 (three) times daily. 03/26/16 09/22/16  Eliezer Lofts, PA-C    Family History Family History  Problem Relation Age of Onset  . Heart disease Father   . Sickle cell trait Unknown   . Lung cancer Unknown   . Colon cancer Unknown        Aunt  . Diabetes Mother   . Stomach cancer Neg Hx   . Rectal cancer Neg Hx   . Esophageal cancer Neg Hx     Social History Social History  Substance Use Topics  . Smoking status: Never Smoker  . Smokeless tobacco: Never Used  . Alcohol use 0.0 oz/week    3 - 4 Glasses of wine per week     Allergies   Duloxetine and Penicillins   Review of Systems Review of Systems  Musculoskeletal: Positive for back pain.  All other systems reviewed and are negative.    Physical Exam Updated Vital Signs BP (!) 179/95 (BP Location: Left Arm)   Pulse 80   Temp 97.7 F (36.5 C) (Oral)   Resp 16   Ht 5' 3.5" (1.613 m)   Wt 84.4 kg (186 lb)   SpO2 99%   BMI 32.43 kg/m   Physical Exam  Constitutional: She is oriented to person, place, and time.  Uncomfortable   HENT:  Head: Normocephalic.  Mouth/Throat: Oropharynx is clear and moist.  Eyes: Pupils are equal, round, and reactive to light. Conjunctivae and EOM are normal.  Neck: Normal range of motion. Neck supple.  Cardiovascular: Normal rate, regular rhythm and normal heart sounds.   Pulmonary/Chest: Effort normal and breath sounds normal. No respiratory distress. She has no wheezes.  Abdominal: Soft. Bowel sounds are normal. She exhibits no distension. There is no tenderness. There is no guarding.  Musculoskeletal: Normal range of motion.  Neurological: She is alert and oriented to person, place, and time. No cranial nerve deficit. Coordination normal.  CN 2-12  intact. Nl finger to nose bilaterally. Nl strength throughout. Mild dec sensation L thumb but nl hand grasp.   Skin: Skin is warm.  Psychiatric: She has a normal mood and affect.  Nursing note and vitals reviewed.    ED Treatments / Results  Labs (all labs ordered are listed, but only abnormal results are displayed) Labs Reviewed  CBC WITH DIFFERENTIAL/PLATELET  BASIC METABOLIC PANEL    EKG  EKG Interpretation None       Radiology No results found.  Procedures Procedures (including critical care time)  Medications Ordered in ED Medications  ketorolac (TORADOL) 30 MG/ML injection 30 mg (not administered)  HYDROmorphone (DILAUDID) injection 1 mg (1 mg  Intravenous Given 08/28/16 0802)  diazepam (VALIUM) injection 5 mg (5 mg Intravenous Given 08/28/16 1517)     Initial Impression / Assessment and Plan / ED Course  I have reviewed the triage vital signs and the nursing notes.  Pertinent labs & imaging results that were available during my care of the patient were reviewed by me and considered in my medical decision making (see chart for details).     Gabrielle Benson is a 61 y.o. female here with L arm radiculopathy. Reviewed MRI from yesterday, no cord impingement. She has pain medicine doctor. Will give pain meds and treat symptomatically.   9:38 AM Labs unremarkable. Pain improved with IV pain meds. She is on percocet 10/325 at home and has pain doctor. Told her I can't prescribe any pain meds. She is on robaxin and requests stronger muscle relaxant. I told her that she can get valium prn as well. Told her to called Dr. Valla Leaver office on Monday to schedule appointment/surgery.    Final Clinical Impressions(s) / ED Diagnoses   Final diagnoses:  None    New Prescriptions New Prescriptions   No medications on file     Drenda Freeze, MD 08/28/16 [id number]

## 2016-09-27 NOTE — Progress Notes (Signed)
Office Visit Note  Patient: Gabrielle Benson             Date of Birth: [birth year]           MRN: [id number]             PCP: [name], MD Referring: [name], MD Visit Date: 10/05/2016 Occupation: @GUAROCC @    Subjective:  Increased pain.   History of Present Illness: Riona Lahti is a 61 y.o. female with history of fibromyalgia, osteoarthritis and disc disease. According to patient she's been having increased discomfort since July 2018. She describes increased pain in her neck and lower back and also her joints. She's been seeing Dr. Governor Rooks recently for her C-spine. She states she had a C-spine injection and is thinking about getting C-spine fusion and near future. She feels weakness in her hands. She is on computer most the day and sits most the day. Her right partial knee replacement is doing well. She is some discomfort in her left knee joint due to osteoarthritis.  Activities of Daily Living Patient reports morning stiffness for 2  hours.   Patient Reports nocturnal pain.  Difficulty dressing/grooming: Reports Difficulty climbing stairs: Denies Difficulty getting out of chair: Reports Difficulty using hands for taps, buttons, cutlery, and/or writing: Denies   Review of Systems  Constitutional: Positive for fatigue. Negative for night sweats, weight gain, weight loss and weakness.  HENT: Positive for mouth dryness. Negative for mouth sores, trouble swallowing, trouble swallowing and nose dryness.   Eyes: Negative for pain, redness, visual disturbance and dryness.  Respiratory: Negative.  Negative for cough, shortness of breath and difficulty breathing.   Cardiovascular: Negative for chest pain, palpitations, hypertension, irregular heartbeat and swelling in legs/feet.  Gastrointestinal: Positive for constipation. Negative for blood in stool and diarrhea.  Endocrine: Negative.  Negative for increased urination.  Genitourinary: Positive for nocturia.  Negative for vaginal dryness.  Musculoskeletal: Positive for arthralgias, joint pain, myalgias, muscle weakness, morning stiffness and myalgias. Negative for joint swelling and muscle tenderness.  Skin: Negative for color change, rash, hair loss, skin tightness, ulcers and sensitivity to sunlight.  Allergic/Immunologic: Negative for susceptible to infections.  Neurological: Negative for dizziness, light-headedness, numbness, headaches, memory loss and night sweats.  Hematological: Negative.  Negative for swollen glands.  Psychiatric/Behavioral: Positive for sleep disturbance. Negative for depressed mood. The patient is not nervous/anxious.     PMFS History:  Patient Active Problem List   Diagnosis Date Noted  . DJD (degenerative joint disease), cervical 10/05/2016  . DDD (degenerative disc disease), lumbar 10/05/2016  . Primary osteoarthritis of left knee 10/05/2016  . Acute left-sided low back pain 03/25/2016  . Pain, joint, multiple sites 03/25/2016  . History of insomnia 03/25/2016  . Greater trochanteric bursitis of both hips 03/25/2016  . Wheezing 02/18/2016  . Chronic pain syndrome 10/11/2015  . Memory dysfunction 04/01/2015  . Status post right partial knee replacement 10/07/2014  . Cough 11/02/2013  . Herpes labialis 11/02/2013  . GERD (gastroesophageal reflux disease) 11/02/2012  . Anxiety 07/01/2011  . Vitamin D deficiency 03/18/2011  . Encounter for well adult exam with abnormal findings 03/13/2011  . PERIPHERAL NEUROPATHY 10/03/2007  . Hyperlipidemia 01/05/2007  . ANEMIA-IRON DEFICIENCY 01/05/2007  . INSOMNIA-SLEEP DISORDER-UNSPEC 01/05/2007  . Allergic rhinitis 01/05/2007  . CONSTIPATION, CHRONIC 01/05/2007  . Fibromyalgia syndrome 01/05/2007    Past Medical History:  Diagnosis Date  . Allergic rhinitis   . ANA positive   . Anemia    Iron  deficiency  . Anxiety   . Cervicalgia    Chronic  . Complication of anesthesia    hard to go to sleep with  Anesthesia-had to have Benadryl with Colonoscopy  . DDD (degenerative disc disease)    Cervical  . DJD (degenerative joint disease)   . Fibromyalgia   . GERD (gastroesophageal reflux disease)   . Hyperlipidemia   . Multinodular thyroid   . Neuropathy, peripheral   . Pneumonia   . Vitamin D deficiency 03/18/2011    Family History  Problem Relation Age of Onset  . Heart disease Father   . Sickle cell trait Unknown   . Lung cancer Unknown   . Colon cancer Unknown        Aunt  . Diabetes Mother   . Stomach cancer Neg Hx   . Rectal cancer Neg Hx   . Esophageal cancer Neg Hx    Past Surgical History:  Procedure Laterality Date  . ABDOMINAL HYSTERECTOMY    . ABDOMINAL HYSTERECTOMY     partial  . BUNIONECTOMY  2011   left foot  . DILATION AND CURETTAGE OF UTERUS    . KNEE ARTHROSCOPY  2004   right  . MYOMECTOMY  2002   prior to hysterectomy  . PARTIAL KNEE ARTHROPLASTY Right 10/07/2014   Procedure: RIGHT UNI KNEE ARTHROPLASTY LATERALLY ;  Surgeon: Paralee Cancel, MD;  Location: [facility];  Service: Orthopedics;  Laterality: Right;  . TONSILLECTOMY     age 8   Social History   Social History Narrative  . No narrative on file     Objective: Vital Signs: BP (!) 143/75   Pulse 75   Resp 14   Ht 5' 2.5" (1.588 m)   Wt 185 lb (83.9 kg)   BMI 33.30 kg/m    Physical Exam  Constitutional: She is oriented to person, place, and time. She appears well-developed and well-nourished.  HENT:  Head: Normocephalic and atraumatic.  Eyes: Conjunctivae and EOM are normal.  Neck: Normal range of motion.  Cardiovascular: Normal rate, regular rhythm, normal heart sounds and intact distal pulses.   Pulmonary/Chest: Effort normal and breath sounds normal.  Abdominal: Soft. Bowel sounds are normal.  Lymphadenopathy:    She has no cervical adenopathy.  Neurological: She is alert and oriented to person, place, and time.  Skin: Skin is warm and dry. Capillary refill takes less than 2  seconds.  Psychiatric: She has a normal mood and affect. Her behavior is normal.  Nursing note and vitals reviewed.    Musculoskeletal Exam: C-spine and lumbar spine limited range of motion with discomfort. Shoulder joints elbow joints wrist joints are good range of motion. There was no synovitis or synovial thickening over MCPs PIPs DIPs. Hip joints knee joints ankles MTPs PIPs are good range of motion. Fibromyalgia tender points were 16 out of 18 positive.  CDAI Exam: No CDAI exam completed.    Investigation: No additional findings.   Imaging: No results found.  Speciality Comments: No specialty comments available.    Procedures:  No procedures performed Allergies: Duloxetine and Penicillins   Assessment / Plan:     Visit Diagnoses: Fibromyalgia syndrome: She has active disease with generalized pain and discomfort.  Primary insomnia: Her insomnia is improved with current medications.  Other fatigue: She still of significant fatigue.  Status post right partial knee replacement: Doing well  Primary osteoarthritis of left knee: Chronic pain  DJD (degenerative joint disease), cervical: She's been having increased discomfort in her C-spine  with limited range of motion. She complains of left sided radiculopathy and had injections recently. She is planning to have C-spine fusion.  DDD (degenerative disc disease), lumbar: Chronic pain  History of scoliosis  History of neuropathy - On pregabalin 150 mg by mouth 3 times a day. Pregabalin is controlling her symptoms fairly well. We will refill it today.  History of chronic pain: She's been taking Robaxin for muscle spasm and discomfort. We will switch her to Skelaxin as Robaxin is not available now.  Vitamin D deficiency: She is on supplement.  History of anxiety    Orders: No orders of the defined types were placed in this encounter.  Meds ordered this encounter  Medications  . pregabalin (LYRICA) 150 MG capsule     Sig: Take 1 capsule (150 mg total) by mouth 3 (three) times daily.    Dispense:  270 capsule    Refill:  1  . metaxalone (SKELAXIN) 800 MG tablet    Sig: Take 1 tablet (800 mg total) by mouth 2 (two) times daily as needed for muscle spasms.    Dispense:  180 tablet    Refill:  0    Face-to-face time spent with patient was 30 minutes.Greater 50% of time was spent in counseling and coordination of care.  Follow-Up Instructions: Return in about 8 months (around 06/04/2017) for FMS OA DDD.   Bo Merino, MD  Note - This record has been created using Editor, commissioning.  Chart creation errors have been sought, but may not always  have been located. Such creation errors do not reflect on  the standard of medical care.

## 2016-09-28 ENCOUNTER — Ambulatory Visit: Payer: 59 | Admitting: Rheumatology

## 2016-09-28 DIAGNOSIS — M5412 Radiculopathy, cervical region: Secondary | ICD-10-CM | POA: Diagnosis not present

## 2016-10-05 ENCOUNTER — Ambulatory Visit (INDEPENDENT_AMBULATORY_CARE_PROVIDER_SITE_OTHER): Payer: 59 | Admitting: Rheumatology

## 2016-10-05 ENCOUNTER — Encounter: Payer: Self-pay | Admitting: Rheumatology

## 2016-10-05 VITALS — BP 143/75 | HR 75 | Resp 14 | Ht 62.5 in | Wt 185.0 lb

## 2016-10-05 DIAGNOSIS — Z8739 Personal history of other diseases of the musculoskeletal system and connective tissue: Secondary | ICD-10-CM | POA: Diagnosis not present

## 2016-10-05 DIAGNOSIS — F5101 Primary insomnia: Secondary | ICD-10-CM

## 2016-10-05 DIAGNOSIS — M5030 Other cervical disc degeneration, unspecified cervical region: Secondary | ICD-10-CM | POA: Diagnosis not present

## 2016-10-05 DIAGNOSIS — Z8659 Personal history of other mental and behavioral disorders: Secondary | ICD-10-CM

## 2016-10-05 DIAGNOSIS — M797 Fibromyalgia: Secondary | ICD-10-CM

## 2016-10-05 DIAGNOSIS — Z8669 Personal history of other diseases of the nervous system and sense organs: Secondary | ICD-10-CM

## 2016-10-05 DIAGNOSIS — M5136 Other intervertebral disc degeneration, lumbar region: Secondary | ICD-10-CM | POA: Diagnosis not present

## 2016-10-05 DIAGNOSIS — R5383 Other fatigue: Secondary | ICD-10-CM

## 2016-10-05 DIAGNOSIS — E559 Vitamin D deficiency, unspecified: Secondary | ICD-10-CM | POA: Diagnosis not present

## 2016-10-05 DIAGNOSIS — M1712 Unilateral primary osteoarthritis, left knee: Secondary | ICD-10-CM

## 2016-10-05 DIAGNOSIS — M7061 Trochanteric bursitis, right hip: Secondary | ICD-10-CM | POA: Diagnosis not present

## 2016-10-05 DIAGNOSIS — Z87898 Personal history of other specified conditions: Secondary | ICD-10-CM

## 2016-10-05 DIAGNOSIS — M7062 Trochanteric bursitis, left hip: Secondary | ICD-10-CM

## 2016-10-05 DIAGNOSIS — Z96651 Presence of right artificial knee joint: Secondary | ICD-10-CM | POA: Diagnosis not present

## 2016-10-05 DIAGNOSIS — M47812 Spondylosis without myelopathy or radiculopathy, cervical region: Secondary | ICD-10-CM

## 2016-10-05 DIAGNOSIS — M51369 Other intervertebral disc degeneration, lumbar region without mention of lumbar back pain or lower extremity pain: Secondary | ICD-10-CM

## 2016-10-05 MED ORDER — PREGABALIN 150 MG PO CAPS: 150.0000 mg | ORAL_CAPSULE | Freq: Three times a day (TID) | ORAL | 1 refills | Status: DC

## 2016-10-05 MED ORDER — METAXALONE 800 MG PO TABS: 800.0000 mg | ORAL_TABLET | Freq: Two times a day (BID) | ORAL | 0 refills | Status: DC | PRN

## 2016-10-14 ENCOUNTER — Encounter: Payer: Self-pay | Admitting: Internal Medicine

## 2016-10-14 ENCOUNTER — Ambulatory Visit (INDEPENDENT_AMBULATORY_CARE_PROVIDER_SITE_OTHER): Payer: 59 | Admitting: Internal Medicine

## 2016-10-14 VITALS — BP 126/84 | HR 62 | Temp 98.4°F | Ht 62.5 in | Wt 191.0 lb

## 2016-10-14 DIAGNOSIS — R739 Hyperglycemia, unspecified: Secondary | ICD-10-CM | POA: Insufficient documentation

## 2016-10-14 DIAGNOSIS — Z114 Encounter for screening for human immunodeficiency virus [HIV]: Secondary | ICD-10-CM | POA: Diagnosis not present

## 2016-10-14 DIAGNOSIS — Z0000 Encounter for general adult medical examination without abnormal findings: Secondary | ICD-10-CM

## 2016-10-14 NOTE — Patient Instructions (Addendum)

## 2016-10-14 NOTE — Progress Notes (Signed)
Subjective:    Patient ID: Gabrielle Benson, female    DOB: 07-01-1955, 61 y.o.   MRN: [id number]  HPI  Here for wellness and f/u;  Overall doing ok;  Pt denies Chest pain, worsening SOB, DOE, wheezing, orthopnea, PND, worsening LE edema, palpitations, dizziness or syncope.  Pt denies neurological change such as new headache, facial or extremity weakness.  Pt denies polydipsia, polyuria, or low sugar symptoms. Pt states overall good compliance with treatment and medications, good tolerability, and has been trying to follow appropriate diet.  Pt denies worsening depressive symptoms, suicidal ideation or panic. No fever, night sweats, wt loss, loss of appetite, or other constitutional symptoms.  Pt states good ability with ADL's, has low fall risk, home safety reviewed and adequate, no other significant changes in hearing or vision, and only occasionally active with exercise.  Did see Dr Devshwar/rheum sept 18, rx with skelaxin prn for pain, b/c robaxin no longer made per manufacturer.   Had recent c spine DDD and LUE radicular pain in aug with aug 3 MRI per ortho, then aug 10 saw Dr Gloris Manchester, found to have significant disc herniation, recommended for surgury now but she has delayed the procedure to jan due to work responsibilities, so had left sided cervical nerve block, and with percocet and nsaids has gotten along ok.  Pain is overall worse on rainy days, does not lift arm as much as possible, even trying not to do dishes b/c aggrevation of the pain can inhibit her work.  Due for f/u Dr Rolena Infante ortho Oct 16. Pain overall tolerable. Due for flu shot but gets this at work. Not really taking the lipitor daily. Past Medical History:  Diagnosis Date  . Allergic rhinitis   . ANA positive   . Anemia    Iron deficiency  . Anxiety   . Cervicalgia    Chronic  . Complication of anesthesia    hard to go to sleep with Anesthesia-had to have Benadryl with Colonoscopy  . DDD (degenerative disc disease)    Cervical  . DJD (degenerative joint disease)   . Fibromyalgia   . GERD (gastroesophageal reflux disease)   . Hyperlipidemia   . Multinodular thyroid   . Neuropathy, peripheral   . Pneumonia   . Vitamin D deficiency 03/18/2011   Past Surgical History:  Procedure Laterality Date  . ABDOMINAL HYSTERECTOMY    . ABDOMINAL HYSTERECTOMY     partial  . BUNIONECTOMY  2011   left foot  . DILATION AND CURETTAGE OF UTERUS    . KNEE ARTHROSCOPY  2004   right  . MYOMECTOMY  2002   prior to hysterectomy  . PARTIAL KNEE ARTHROPLASTY Right 10/07/2014   Procedure: RIGHT UNI KNEE ARTHROPLASTY LATERALLY ;  Surgeon: Paralee Cancel, MD;  Location: [facility];  Service: Orthopedics;  Laterality: Right;  . TONSILLECTOMY     age 60    reports that she has never smoked. She has never used smokeless tobacco. She reports that she drinks alcohol. She reports that she does not use drugs. family history includes Colon cancer in her unknown relative; Diabetes in her mother; Heart disease in her father; Lung cancer in her unknown relative; Sickle cell trait in her unknown relative. Allergies  Allergen Reactions  . Duloxetine     REACTION: dizzy, nervous  . Penicillins     Has patient had a PCN reaction causing immediate rash, facial/tongue/throat swelling, SOB or lightheadedness with hypotension: No Has patient had a PCN reaction causing severe  rash involving mucus membranes or skin necrosis: No Has patient had a PCN reaction that required hospitalization: No Has patient had a PCN reaction occurring within the last 10 years: No If all of the above answers are "NO", then may proceed with Cephalosporin use.     Current Outpatient Prescriptions on File Prior to Visit  Medication Sig Dispense Refill  . diclofenac sodium (VOLTAREN) 1 % GEL Apply 2 g topically 4 (four) times daily as needed (pain).     Marland Kitchen estrogens, conjugated, (PREMARIN) 0.625 MG tablet Take 0.625 mg by mouth daily.    . Menthol, Topical Analgesic,  (BIOFREEZE EX) Apply 1 application topically daily as needed (joint pain).     . metaxalone (SKELAXIN) 800 MG tablet Take 1 tablet (800 mg total) by mouth 2 (two) times daily as needed for muscle spasms. 180 tablet 0  . oxyCODONE-acetaminophen (PERCOCET) 10-325 MG tablet Take 1 tablet by mouth 3 (three) times daily as needed for severe pain.    . pantoprazole (PROTONIX) 40 MG tablet TAKE 1 TABLET EVERY DAY (Patient taking differently: TAKE 40MG  BY MOUTH EVERY DAY) 90 tablet 1  . pregabalin (LYRICA) 150 MG capsule Take 1 capsule (150 mg total) by mouth 3 (three) times daily. 270 capsule 1  . atorvastatin (LIPITOR) 20 MG tablet Take 1 tablet (20 mg total) by mouth daily. 90 tablet 3   No current facility-administered medications on file prior to visit.    Review of Systems Constitutional: Negative for other unusual diaphoresis, sweats, appetite or weight changes HENT: Negative for other worsening hearing loss, ear pain, facial swelling, mouth sores or neck stiffness.   Eyes: Negative for other worsening pain, redness or other visual disturbance.  Respiratory: Negative for other stridor or swelling Cardiovascular: Negative for other palpitations or other chest pain  Gastrointestinal: Negative for worsening diarrhea or loose stools, blood in stool, distention or other pain Genitourinary: Negative for hematuria, flank pain or other change in urine volume.  Musculoskeletal: Negative for myalgias or other joint swelling.  Skin: Negative for other color change, or other wound or worsening drainage.  Neurological: Negative for other syncope or numbness. Hematological: Negative for other adenopathy or swelling Psychiatric/Behavioral: Negative for hallucinations, other worsening agitation, SI, self-injury, or new decreased concentration All other system neg per pt    Objective:   Physical Exam BP 126/84   Pulse 62   Temp 98.4 F (36.9 C) (Oral)   Ht 5' 2.5" (1.588 m)   Wt 191 lb (86.6 kg)   SpO2  100%   BMI 34.38 kg/m  VS noted,  Constitutional: Pt is oriented to person, place, and time. Appears well-developed and well-nourished, in no significant distress and comfortable Head: Normocephalic and atraumatic  Eyes: Conjunctivae and EOM are normal. Pupils are equal, round, and reactive to light Right Ear: External ear normal without discharge Left Ear: External ear normal without discharge Nose: Nose without discharge or deformity Mouth/Throat: Oropharynx is without other ulcerations and moist  Neck: Normal range of motion. Neck supple. No JVD present. No tracheal deviation present or significant neck LA or mass Cardiovascular: Normal rate, regular rhythm, normal heart sounds and intact distal pulses.   Pulmonary/Chest: WOB normal and breath sounds without rales or wheezing  Abdominal: Soft. Bowel sounds are normal. NT. No HSM  Musculoskeletal: Normal range of motion. Exhibits no edema Lymphadenopathy: Has no other cervical adenopathy.  Neurological: Pt is alert and oriented to person, place, and time. Pt has normal reflexes. No cranial nerve deficit. Motor  grossly intact, Gait intact Skin: Skin is warm and dry. No rash noted or new ulcerations Psychiatric:  Has normal mood and affect. Behavior is normal without agitation No other exam findings       Assessment & Plan:

## 2016-10-16 NOTE — Assessment & Plan Note (Signed)

## 2016-10-16 NOTE — Assessment & Plan Note (Signed)
For a1c with next labs  

## 2016-10-18 ENCOUNTER — Telehealth: Payer: Self-pay

## 2016-10-18 ENCOUNTER — Other Ambulatory Visit (INDEPENDENT_AMBULATORY_CARE_PROVIDER_SITE_OTHER): Payer: 59

## 2016-10-18 DIAGNOSIS — Z0000 Encounter for general adult medical examination without abnormal findings: Secondary | ICD-10-CM | POA: Diagnosis not present

## 2016-10-18 DIAGNOSIS — R739 Hyperglycemia, unspecified: Secondary | ICD-10-CM

## 2016-10-18 DIAGNOSIS — Z114 Encounter for screening for human immunodeficiency virus [HIV]: Secondary | ICD-10-CM

## 2016-10-18 LAB — LIPID PANEL
CHOLESTEROL: 219 mg/dL — AB (ref 0–200)
HDL: 58.1 mg/dL (ref >39.00)
LDL CALC: 128 mg/dL — AB (ref 0–99)
NonHDL: 161.03
TRIGLYCERIDES: 164 mg/dL — AB (ref 0.0–149.0)
Total CHOL/HDL Ratio: 4
VLDL: 32.8 mg/dL (ref 0.0–40.0)

## 2016-10-18 LAB — URINALYSIS, ROUTINE W REFLEX MICROSCOPIC
BILIRUBIN URINE: NEGATIVE
Hgb urine dipstick: NEGATIVE
KETONES UR: NEGATIVE
LEUKOCYTES UA: NEGATIVE
NITRITE: NEGATIVE
PH: 6.5 (ref 5.0–8.0)
RBC / HPF: NONE SEEN (ref 0–?)
Specific Gravity, Urine: 1.01 (ref 1.000–1.030)
TOTAL PROTEIN, URINE-UPE24: NEGATIVE
URINE GLUCOSE: NEGATIVE
Urobilinogen, UA: 0.2 (ref 0.0–1.0)

## 2016-10-18 LAB — CBC WITH DIFFERENTIAL/PLATELET
Basophils Absolute: 0 10*3/uL (ref 0.0–0.1)
Basophils Relative: 0.9 % (ref 0.0–3.0)
EOS ABS: 0.1 10*3/uL (ref 0.0–0.7)
EOS PCT: 1.8 % (ref 0.0–5.0)
HCT: 36.9 % (ref 36.0–46.0)
HEMOGLOBIN: 11.7 g/dL — AB (ref 12.0–15.0)
LYMPHS PCT: 41.4 % (ref 12.0–46.0)
Lymphs Abs: 1.8 10*3/uL (ref 0.7–4.0)
MCHC: 31.6 g/dL (ref 30.0–36.0)
MCV: 84.3 fl (ref 78.0–100.0)
MONO ABS: 0.4 10*3/uL (ref 0.1–1.0)
Monocytes Relative: 8.8 % (ref 3.0–12.0)
Neutro Abs: 2.1 10*3/uL (ref 1.4–7.7)
Neutrophils Relative %: 47.1 % (ref 43.0–77.0)
Platelets: 332 10*3/uL (ref 150.0–400.0)
RBC: 4.37 Mil/uL (ref 3.87–5.11)
RDW: 13.9 % (ref 11.5–15.5)
WBC: 4.4 10*3/uL (ref 4.0–10.5)

## 2016-10-18 LAB — BASIC METABOLIC PANEL
BUN: 12 mg/dL (ref 6–23)
CO2: 30 mEq/L (ref 19–32)
CREATININE: 0.56 mg/dL (ref 0.40–1.20)
Calcium: 10.7 mg/dL — ABNORMAL HIGH (ref 8.4–10.5)
Chloride: 101 mEq/L (ref 96–112)
GFR: 142.35 mL/min (ref >60.00)
GLUCOSE: 91 mg/dL (ref 70–99)
POTASSIUM: 3.8 meq/L (ref 3.5–5.1)
Sodium: 138 mEq/L (ref 135–145)

## 2016-10-18 LAB — HEPATIC FUNCTION PANEL
ALBUMIN: 4.2 g/dL (ref 3.5–5.2)
ALT: 13 U/L (ref 0–35)
AST: 17 U/L (ref 0–37)
Alkaline Phosphatase: 87 U/L (ref 39–117)
Bilirubin, Direct: 0 mg/dL (ref 0.0–0.3)
Total Bilirubin: 0.3 mg/dL (ref 0.2–1.2)
Total Protein: 7.1 g/dL (ref 6.0–8.3)

## 2016-10-18 LAB — HEMOGLOBIN A1C: Hgb A1c MFr Bld: 6.1 % (ref 4.6–6.5)

## 2016-10-18 NOTE — Telephone Encounter (Signed)
Called pt, LVM reminding her to have lab work done for per physical form (work) to be completed and faxed back.

## 2016-10-19 LAB — HIV ANTIBODY (ROUTINE TESTING W REFLEX): HIV 1&2 Ab, 4th Generation: NONREACTIVE

## 2016-10-19 LAB — TSH: TSH: 0.88 u[IU]/mL (ref 0.35–4.50)

## 2016-11-01 DIAGNOSIS — M5412 Radiculopathy, cervical region: Secondary | ICD-10-CM | POA: Diagnosis not present

## 2016-11-01 DIAGNOSIS — M5020 Other cervical disc displacement, unspecified cervical region: Secondary | ICD-10-CM | POA: Diagnosis not present

## 2016-11-12 DIAGNOSIS — M5412 Radiculopathy, cervical region: Secondary | ICD-10-CM | POA: Diagnosis not present

## 2016-11-17 DIAGNOSIS — M5412 Radiculopathy, cervical region: Secondary | ICD-10-CM | POA: Diagnosis not present

## 2016-11-19 DIAGNOSIS — M5412 Radiculopathy, cervical region: Secondary | ICD-10-CM | POA: Diagnosis not present

## 2016-11-24 DIAGNOSIS — M5412 Radiculopathy, cervical region: Secondary | ICD-10-CM | POA: Diagnosis not present

## 2016-11-26 DIAGNOSIS — M5412 Radiculopathy, cervical region: Secondary | ICD-10-CM | POA: Diagnosis not present

## 2016-12-01 DIAGNOSIS — M5412 Radiculopathy, cervical region: Secondary | ICD-10-CM | POA: Diagnosis not present

## 2016-12-03 DIAGNOSIS — M5412 Radiculopathy, cervical region: Secondary | ICD-10-CM | POA: Diagnosis not present

## 2016-12-06 DIAGNOSIS — M5412 Radiculopathy, cervical region: Secondary | ICD-10-CM | POA: Diagnosis not present

## 2016-12-08 DIAGNOSIS — M5412 Radiculopathy, cervical region: Secondary | ICD-10-CM | POA: Diagnosis not present

## 2016-12-13 DIAGNOSIS — M5412 Radiculopathy, cervical region: Secondary | ICD-10-CM | POA: Diagnosis not present

## 2016-12-15 DIAGNOSIS — M5412 Radiculopathy, cervical region: Secondary | ICD-10-CM | POA: Diagnosis not present

## 2016-12-28 ENCOUNTER — Other Ambulatory Visit: Payer: Self-pay | Admitting: Internal Medicine

## 2017-01-13 ENCOUNTER — Telehealth: Payer: Self-pay | Admitting: Rheumatology

## 2017-01-13 MED ORDER — METHOCARBAMOL 500 MG PO TABS: ORAL_TABLET | ORAL | 0 refills | Status: DC

## 2017-01-13 NOTE — Telephone Encounter (Signed)
Patient called stating that in September she was given Skelaxin because Robaxin which she had been taken was no longer available.  The Skelaxin makes her drowsy and found out from Pharmacy that Robaxin is now available again.  She would like to be back on Robaxin as her maintenance drug.  Pt.'s pharmacy is [facility] on [address].  CB# [phone]

## 2017-01-13 NOTE — Telephone Encounter (Signed)
yes

## 2017-01-13 NOTE — Telephone Encounter (Signed)
Okay to switch the patient back to Robaxin now that the pharmacy has it in stock?

## 2017-01-13 NOTE — Telephone Encounter (Signed)
Last visit: 10/05/2016 Next visit: 06/02/2017  Okay to refill Robaxin per Dr. Estanislado Pandy.

## 2017-02-07 DIAGNOSIS — M545 Low back pain: Secondary | ICD-10-CM | POA: Diagnosis not present

## 2017-02-07 DIAGNOSIS — M7542 Impingement syndrome of left shoulder: Secondary | ICD-10-CM | POA: Diagnosis not present

## 2017-02-07 DIAGNOSIS — M5032 Other cervical disc degeneration, mid-cervical region, unspecified level: Secondary | ICD-10-CM | POA: Diagnosis not present

## 2017-02-12 DIAGNOSIS — M25512 Pain in left shoulder: Secondary | ICD-10-CM | POA: Diagnosis not present

## 2017-02-20 ENCOUNTER — Ambulatory Visit (HOSPITAL_COMMUNITY)
Admission: EM | Admit: 2017-02-20 | Discharge: 2017-02-20 | Disposition: A | Discharge status: Home / Self Care | Payer: 59 | Attending: Family Medicine | Admitting: Family Medicine

## 2017-02-20 ENCOUNTER — Encounter (HOSPITAL_COMMUNITY): Payer: Self-pay | Admitting: *Deleted

## 2017-02-20 DIAGNOSIS — R05 Cough: Secondary | ICD-10-CM | POA: Diagnosis not present

## 2017-02-20 DIAGNOSIS — B9789 Other viral agents as the cause of diseases classified elsewhere: Secondary | ICD-10-CM

## 2017-02-20 DIAGNOSIS — R059 Cough, unspecified: Secondary | ICD-10-CM

## 2017-02-20 DIAGNOSIS — J069 Acute upper respiratory infection, unspecified: Secondary | ICD-10-CM | POA: Diagnosis not present

## 2017-02-20 MED ORDER — HYDROCOD POLST-CPM POLST ER 10-8 MG/5ML PO SUER: 5.0000 mL | Freq: Two times a day (BID) | ORAL | 0 refills | Status: DC | PRN

## 2017-02-20 NOTE — ED Triage Notes (Signed)
Patient reports cold like symptoms x 1.5 with increasing cough. Would like some cough medicine to help. States that the cough is worse at night.

## 2017-02-20 NOTE — ED Provider Notes (Signed)
[place]    CSN: [id number] Arrival date & time: 02/20/17  1406     History   Chief Complaint Chief Complaint  Patient presents with  . Nasal Congestion  . Cough    HPI Gabrielle Benson is a 62 y.o. female.   Presents with a cough x 2 days. It is "overwhelming" and she had the same presentation last year and got a syrup that helped so much. She has no fevers, chills or fatigue. No nasal congestion is noted. Her husband is also sick      Past Medical History:  Diagnosis Date  . Allergic rhinitis   . ANA positive   . Anemia    Iron deficiency  . Anxiety   . Cervicalgia    Chronic  . Complication of anesthesia    hard to go to sleep with Anesthesia-had to have Benadryl with Colonoscopy  . DDD (degenerative disc disease)    Cervical  . DJD (degenerative joint disease)   . Fibromyalgia   . GERD (gastroesophageal reflux disease)   . Hyperlipidemia   . Multinodular thyroid   . Neuropathy, peripheral   . Pneumonia   . Vitamin D deficiency 03/18/2011    Patient Active Problem List   Diagnosis Date Noted  . Hyperglycemia 10/14/2016  . DJD (degenerative joint disease), cervical 10/05/2016  . DDD (degenerative disc disease), lumbar 10/05/2016  . Primary osteoarthritis of left knee 10/05/2016  . Acute left-sided low back pain 03/25/2016  . Pain, joint, multiple sites 03/25/2016  . History of insomnia 03/25/2016  . Greater trochanteric bursitis of both hips 03/25/2016  . Wheezing 02/18/2016  . Chronic pain syndrome 10/11/2015  . Memory dysfunction 04/01/2015  . Status post right partial knee replacement 10/07/2014  . Cough 11/02/2013  . Herpes labialis 11/02/2013  . GERD (gastroesophageal reflux disease) 11/02/2012  . Anxiety 07/01/2011  . Vitamin D deficiency 03/18/2011  . Preventative health care 03/13/2011  . PERIPHERAL NEUROPATHY 10/03/2007  . Hyperlipidemia 01/05/2007  . ANEMIA-IRON DEFICIENCY 01/05/2007  . INSOMNIA-SLEEP DISORDER-UNSPEC  01/05/2007  . Allergic rhinitis 01/05/2007  . CONSTIPATION, CHRONIC 01/05/2007  . Fibromyalgia syndrome 01/05/2007    Past Surgical History:  Procedure Laterality Date  . ABDOMINAL HYSTERECTOMY    . ABDOMINAL HYSTERECTOMY     partial  . BUNIONECTOMY  2011   left foot  . DILATION AND CURETTAGE OF UTERUS    . KNEE ARTHROSCOPY  2004   right  . MYOMECTOMY  2002   prior to hysterectomy  . PARTIAL KNEE ARTHROPLASTY Right 10/07/2014   Procedure: RIGHT UNI KNEE ARTHROPLASTY LATERALLY ;  Surgeon: Paralee Cancel, MD;  Location: [facility];  Service: Orthopedics;  Laterality: Right;  . TONSILLECTOMY     age 62    OB History    No data available       Home Medications    Prior to Admission medications   Medication Sig Start Date End Date Taking? Authorizing Provider  diclofenac sodium (VOLTAREN) 1 % GEL Apply 2 g topically 4 (four) times daily as needed (pain).    Yes [provider]  estrogens, conjugated, (PREMARIN) 0.625 MG tablet Take 0.625 mg by mouth daily.   Yes [provider]  Menthol, Topical Analgesic, (BIOFREEZE EX) Apply 1 application topically daily as needed (joint pain).    Yes [provider]  methocarbamol (ROBAXIN) 500 MG tablet Take 500 mg by mouth 2 (two) times daily as needed. 01/13/17  Yes Deveshwar, Abel Presto, MD  oxyCODONE-acetaminophen (PERCOCET) 10-325 MG tablet  Take 1 tablet by mouth 3 (three) times daily as needed for severe pain. 08/27/16  Yes [provider]  pantoprazole (PROTONIX) 40 MG tablet TAKE 1 TABLET EVERY DAY 12/29/16  Yes [name], MD  pregabalin (LYRICA) 150 MG capsule Take 1 capsule (150 mg total) by mouth 3 (three) times daily. 10/05/16 04/03/17 Yes Deveshwar, Abel Presto, MD  atorvastatin (LIPITOR) 20 MG tablet Take 1 tablet (20 mg total) by mouth daily. 10/09/15 10/08/16  [name], MD  metaxalone (SKELAXIN) 800 MG tablet Take 1 tablet (800 mg total) by mouth 2 (two) times daily as needed for muscle spasms. 10/05/16    Bo Merino, MD    Family History Family History  Problem Relation Age of Onset  . Heart disease Father   . Sickle cell trait Unknown   . Lung cancer Unknown   . Colon cancer Unknown        Aunt  . Diabetes Mother   . Stomach cancer Neg Hx   . Rectal cancer Neg Hx   . Esophageal cancer Neg Hx     Social History Social History   Tobacco Use  . Smoking status: Never Smoker  . Smokeless tobacco: Never Used  Substance Use Topics  . Alcohol use: Yes    Alcohol/week: 0.0 oz    Types: 3 - 4 Glasses of wine per week  . Drug use: No     Allergies   Duloxetine and Penicillins   Review of Systems Review of Systems  Constitutional: Negative for fatigue and fever.  HENT: Negative.   Respiratory: Positive for cough. Negative for shortness of breath and wheezing.   Skin: Negative for rash.     Physical Exam Triage Vital Signs ED Triage Vitals [02/20/17 1506]  Enc Vitals Group     BP 136/82     Pulse Rate 78     Resp 17     Temp 98.2 F (36.8 C)     Temp Source Oral     SpO2 100 %     Weight      Height      Head Circumference      Peak Flow      Pain Score      Pain Loc      Pain Edu?      Excl. in [place]?    No data found.  Updated Vital Signs BP 136/82 (BP Location: Left Arm)   Pulse 78   Temp 98.2 F (36.8 C) (Oral)   Resp 17   SpO2 100%   Visual Acuity Right Eye Distance:   Left Eye Distance:   Bilateral Distance:    Right Eye Near:   Left Eye Near:    Bilateral Near:     Physical Exam  Constitutional: She is oriented to person, place, and time. She appears well-developed and well-nourished. No distress.  Neck: Normal range of motion.  Cardiovascular: Normal rate and regular rhythm.  Pulmonary/Chest: Effort normal.  Mild basilar rhonci without crackles or rales  Lymphadenopathy:    She has no cervical adenopathy.  Neurological: She is alert and oriented to person, place, and time.  Skin: Skin is warm and dry. She is not diaphoretic.    Psychiatric: Her behavior is normal.  Nursing note and vitals reviewed.    UC Treatments / Results  Labs (all labs ordered are listed, but only abnormal results are displayed) Labs Reviewed - No data to display  EKG  EKG Interpretation None  Radiology No results found.  Procedures Procedures (including critical care time)  Medications Ordered in UC Medications - No data to display   Initial Impression / Assessment and Plan / UC Course  I have reviewed the triage vital signs and the nursing notes.  Pertinent labs & imaging results that were available during my care of the patient were reviewed by me and considered in my medical decision making (see chart for details).     Viral cough-treat with RX tussionex, fluids and symptomatic care. No indication for an abx at this time.   Final Clinical Impressions(s) / UC Diagnoses   Final diagnoses:  None    ED Discharge Orders    None       Controlled Substance Prescriptions [place] Controlled Substance Registry consulted? Not Applicable   Prudencio Pair 02/20/17 1534

## 2017-02-20 NOTE — Discharge Instructions (Signed)
You have a viral cough. Treat with cough medication and push fluids. If things are not improving in 10-12 days then f/u. Feel better. May also use Claritin daily for post nasal drip or nasal sprays if needed.

## 2017-02-23 DIAGNOSIS — S43432D Superior glenoid labrum lesion of left shoulder, subsequent encounter: Secondary | ICD-10-CM | POA: Diagnosis not present

## 2017-03-29 DIAGNOSIS — Z01419 Encounter for gynecological examination (general) (routine) without abnormal findings: Secondary | ICD-10-CM | POA: Diagnosis not present

## 2017-04-05 NOTE — Progress Notes (Deleted)
Subjective:    Patient ID: Gabrielle Benson, female    DOB: 07/07/1955, 62 y.o.   MRN: [id number]  HPI She is here for an acute visit for cold symptoms.  Her symptoms started   She is experiencing  She has taken  Medications and allergies reviewed with patient and updated if appropriate.  Patient Active Problem List   Diagnosis Date Noted  . Hyperglycemia 10/14/2016  . DJD (degenerative joint disease), cervical 10/05/2016  . DDD (degenerative disc disease), lumbar 10/05/2016  . Primary osteoarthritis of left knee 10/05/2016  . Acute left-sided low back pain 03/25/2016  . Pain, joint, multiple sites 03/25/2016  . History of insomnia 03/25/2016  . Greater trochanteric bursitis of both hips 03/25/2016  . Wheezing 02/18/2016  . Chronic pain syndrome 10/11/2015  . Memory dysfunction 04/01/2015  . Status post right partial knee replacement 10/07/2014  . Cough 11/02/2013  . Herpes labialis 11/02/2013  . GERD (gastroesophageal reflux disease) 11/02/2012  . Anxiety 07/01/2011  . Vitamin D deficiency 03/18/2011  . Preventative health care 03/13/2011  . PERIPHERAL NEUROPATHY 10/03/2007  . Hyperlipidemia 01/05/2007  . ANEMIA-IRON DEFICIENCY 01/05/2007  . INSOMNIA-SLEEP DISORDER-UNSPEC 01/05/2007  . Allergic rhinitis 01/05/2007  . CONSTIPATION, CHRONIC 01/05/2007  . Fibromyalgia syndrome 01/05/2007    Current Outpatient Medications on File Prior to Visit  Medication Sig Dispense Refill  . atorvastatin (LIPITOR) 20 MG tablet Take 1 tablet (20 mg total) by mouth daily. 90 tablet 3  . chlorpheniramine-HYDROcodone (TUSSIONEX PENNKINETIC ER) 10-8 MG/5ML SUER Take 5 mLs by mouth every 12 (twelve) hours as needed for cough. 180 mL 0  . diclofenac sodium (VOLTAREN) 1 % GEL Apply 2 g topically 4 (four) times daily as needed (pain).     Marland Kitchen estrogens, conjugated, (PREMARIN) 0.625 MG tablet Take 0.625 mg by mouth daily.    . Menthol, Topical Analgesic, (BIOFREEZE EX) Apply 1  application topically daily as needed (joint pain).     . metaxalone (SKELAXIN) 800 MG tablet Take 1 tablet (800 mg total) by mouth 2 (two) times daily as needed for muscle spasms. 180 tablet 0  . methocarbamol (ROBAXIN) 500 MG tablet Take 500 mg by mouth 2 (two) times daily as needed. 180 tablet 0  . oxyCODONE-acetaminophen (PERCOCET) 10-325 MG tablet Take 1 tablet by mouth 3 (three) times daily as needed for severe pain.    . pantoprazole (PROTONIX) 40 MG tablet TAKE 1 TABLET EVERY DAY 90 tablet 2  . pregabalin (LYRICA) 150 MG capsule Take 1 capsule (150 mg total) by mouth 3 (three) times daily. 270 capsule 1   No current facility-administered medications on file prior to visit.     Past Medical History:  Diagnosis Date  . Allergic rhinitis   . ANA positive   . Anemia    Iron deficiency  . Anxiety   . Cervicalgia    Chronic  . Complication of anesthesia    hard to go to sleep with Anesthesia-had to have Benadryl with Colonoscopy  . DDD (degenerative disc disease)    Cervical  . DJD (degenerative joint disease)   . Fibromyalgia   . GERD (gastroesophageal reflux disease)   . Hyperlipidemia   . Multinodular thyroid   . Neuropathy, peripheral   . Pneumonia   . Vitamin D deficiency 03/18/2011    Past Surgical History:  Procedure Laterality Date  . ABDOMINAL HYSTERECTOMY    . ABDOMINAL HYSTERECTOMY     partial  . BUNIONECTOMY  2011   left foot  .  DILATION AND CURETTAGE OF UTERUS    . KNEE ARTHROSCOPY  2004   right  . MYOMECTOMY  2002   prior to hysterectomy  . PARTIAL KNEE ARTHROPLASTY Right 10/07/2014   Procedure: RIGHT UNI KNEE ARTHROPLASTY LATERALLY ;  Surgeon: Paralee Cancel, MD;  Location: [facility];  Service: Orthopedics;  Laterality: Right;  . TONSILLECTOMY     age 58    Social History   Socioeconomic History  . Marital status: Married    Spouse name: Not on file  . Number of children: 2  . Years of education: Not on file  . Highest education level: Not on file    Social Needs  . Financial resource strain: Not on file  . Food insecurity - worry: Not on file  . Food insecurity - inability: Not on file  . Transportation needs - medical: Not on file  . Transportation needs - non-medical: Not on file  Occupational History  . Occupation: Dealer: [employer]  Tobacco Use  . Smoking status: Never Smoker  . Smokeless tobacco: Never Used  Substance and Sexual Activity  . Alcohol use: Yes    Alcohol/week: 0.0 oz    Types: 3 - 4 Glasses of wine per week  . Drug use: No  . Sexual activity: Not on file  Other Topics Concern  . Not on file  Social History Narrative  . Not on file    Family History  Problem Relation Age of Onset  . Heart disease Father   . Sickle cell trait Unknown   . Lung cancer Unknown   . Colon cancer Unknown        Aunt  . Diabetes Mother   . Stomach cancer Neg Hx   . Rectal cancer Neg Hx   . Esophageal cancer Neg Hx     Review of Systems     Objective:  There were no vitals filed for this visit. There were no vitals filed for this visit. There is no height or weight on file to calculate BMI.  Wt Readings from Last 3 Encounters:  10/14/16 191 lb (86.6 kg)  10/05/16 185 lb (83.9 kg)  08/28/16 186 lb (84.4 kg)     Physical Exam GENERAL APPEARANCE: Appears stated age, well appearing, NAD EYES: conjunctiva clear, no icterus HEENT: bilateral tympanic membranes and ear canals normal, oropharynx with mild erythema, no thyromegaly, trachea midline, no cervical or supraclavicular lymphadenopathy LUNGS: Clear to auscultation without wheeze or crackles, unlabored breathing, good air entry bilaterally CARDIOVASCULAR: Normal S1,S2 without murmurs, no edema SKIN: warm, dry        Assessment & Plan:   See Problem List for Assessment and Plan of chronic medical problems.

## 2017-04-06 ENCOUNTER — Ambulatory Visit: Payer: 59 | Admitting: Internal Medicine

## 2017-04-07 ENCOUNTER — Ambulatory Visit: Payer: 59 | Admitting: Internal Medicine

## 2017-04-07 DIAGNOSIS — Z0289 Encounter for other administrative examinations: Secondary | ICD-10-CM

## 2017-04-11 ENCOUNTER — Ambulatory Visit: Payer: 59 | Admitting: Internal Medicine

## 2017-04-11 DIAGNOSIS — D2371 Other benign neoplasm of skin of right lower limb, including hip: Secondary | ICD-10-CM | POA: Diagnosis not present

## 2017-04-15 DIAGNOSIS — K219 Gastro-esophageal reflux disease without esophagitis: Secondary | ICD-10-CM | POA: Diagnosis not present

## 2017-04-15 DIAGNOSIS — J300 Vasomotor rhinitis: Secondary | ICD-10-CM | POA: Diagnosis not present

## 2017-04-15 DIAGNOSIS — R05 Cough: Secondary | ICD-10-CM | POA: Diagnosis not present

## 2017-04-22 DIAGNOSIS — M7711 Lateral epicondylitis, right elbow: Secondary | ICD-10-CM | POA: Diagnosis not present

## 2017-04-22 DIAGNOSIS — M25521 Pain in right elbow: Secondary | ICD-10-CM | POA: Diagnosis not present

## 2017-04-28 DIAGNOSIS — M7542 Impingement syndrome of left shoulder: Secondary | ICD-10-CM | POA: Diagnosis not present

## 2017-04-28 DIAGNOSIS — S43432A Superior glenoid labrum lesion of left shoulder, initial encounter: Secondary | ICD-10-CM | POA: Diagnosis not present

## 2017-04-28 DIAGNOSIS — M19012 Primary osteoarthritis, left shoulder: Secondary | ICD-10-CM | POA: Diagnosis not present

## 2017-04-28 DIAGNOSIS — G8918 Other acute postprocedural pain: Secondary | ICD-10-CM | POA: Diagnosis not present

## 2017-04-28 DIAGNOSIS — S46292A Other injury of muscle, fascia and tendon of other parts of biceps, left arm, initial encounter: Secondary | ICD-10-CM | POA: Diagnosis not present

## 2017-04-28 HISTORY — PX: TOTAL SHOULDER ARTHROPLASTY: SHX126

## 2017-05-04 DIAGNOSIS — Z4789 Encounter for other orthopedic aftercare: Secondary | ICD-10-CM | POA: Diagnosis not present

## 2017-05-04 DIAGNOSIS — M25512 Pain in left shoulder: Secondary | ICD-10-CM | POA: Diagnosis not present

## 2017-05-05 ENCOUNTER — Ambulatory Visit: Payer: 59 | Admitting: Rheumatology

## 2017-05-09 DIAGNOSIS — M25512 Pain in left shoulder: Secondary | ICD-10-CM | POA: Diagnosis not present

## 2017-05-16 DIAGNOSIS — M25512 Pain in left shoulder: Secondary | ICD-10-CM | POA: Diagnosis not present

## 2017-05-19 DIAGNOSIS — M25512 Pain in left shoulder: Secondary | ICD-10-CM | POA: Diagnosis not present

## 2017-05-19 NOTE — Progress Notes (Signed)
Office Visit Note  Patient: Gabrielle Benson             Date of Birth: 1955/12/17           MRN: [id number]             PCP: [name], MD Referring: [name], MD Visit Date: 06/02/2017 Occupation: @GUAROCC @    Subjective:  Left shoulder discomfort    History of Present Illness: Gabrielle Benson is a 62 y.o. female with history of fibromyalgia, osteoarthritis, and DDD.  Patient continues to take Lyrica 150 mg 3 times daily and Robaxin as needed for muscle spasms.  She states that her fibromyalgia has been improving significantly.  She states that she continues to have trochanteric bursitis bilaterally.  She states that she has been a lot more active and losing weight which has been helping with her joint pain.  She states her fatigue is also improved due to her increased activity.  She states her insomnia is also improving.  She states that she had a left shoulder arthroscopic knee done by Dr. Theda Sers on April 28, 2017.  She states she continues to go to physical therapy and is progressing well.  she has 6 more weeks of physical therapy and will be following up with Dr. Theda Sers on June 26. She reports that she will continue to have some achiness in her hands and elbows if there are frequent weather changes.  She states that her bilateral knees are doing well.  She states she continues to have lower back pain that her neck pain has improved since her left shoulder surgery.   Activities of Daily Living:  Patient reports morning stiffness for 30 minutes.   Patient Denies nocturnal pain.  Difficulty dressing/grooming: Denies Difficulty climbing stairs: Denies Difficulty getting out of chair: Denies Difficulty using hands for taps, buttons, cutlery, and/or writing: Denies   Review of Systems  Constitutional: Negative for fatigue.  HENT: Negative for mouth sores, mouth dryness and nose dryness.   Eyes: Negative for pain, visual disturbance and dryness.  Respiratory:  Negative for cough, hemoptysis, shortness of breath and difficulty breathing.   Cardiovascular: Negative for chest pain, palpitations, hypertension and swelling in legs/feet.  Gastrointestinal: Positive for constipation. Negative for blood in stool and diarrhea.  Endocrine: Negative for increased urination.  Genitourinary: Negative for painful urination.  Musculoskeletal: Positive for morning stiffness and muscle tenderness. Negative for arthralgias, joint pain, joint swelling, myalgias, muscle weakness and myalgias.  Skin: Negative for color change, pallor, rash, hair loss, nodules/bumps, skin tightness, ulcers and sensitivity to sunlight.  Allergic/Immunologic: Negative for susceptible to infections.  Neurological: Negative for dizziness, numbness, headaches and weakness.  Hematological: Negative for swollen glands.  Psychiatric/Behavioral: Negative for depressed mood and sleep disturbance. The patient is not nervous/anxious.     PMFS History:  Patient Active Problem List   Diagnosis Date Noted  . Hyperglycemia 10/14/2016  . DJD (degenerative joint disease), cervical 10/05/2016  . DDD (degenerative disc disease), lumbar 10/05/2016  . Primary osteoarthritis of left knee 10/05/2016  . Acute left-sided low back pain 03/25/2016  . Pain, joint, multiple sites 03/25/2016  . History of insomnia 03/25/2016  . Greater trochanteric bursitis of both hips 03/25/2016  . Wheezing 02/18/2016  . Chronic pain syndrome 10/11/2015  . Memory dysfunction 04/01/2015  . Status post right partial knee replacement 10/07/2014  . Cough 11/02/2013  . Herpes labialis 11/02/2013  . GERD (gastroesophageal reflux disease) 11/02/2012  . Anxiety 07/01/2011  . Vitamin  D deficiency 03/18/2011  . Preventative health care 03/13/2011  . PERIPHERAL NEUROPATHY 10/03/2007  . Hyperlipidemia 01/05/2007  . ANEMIA-IRON DEFICIENCY 01/05/2007  . INSOMNIA-SLEEP DISORDER-UNSPEC 01/05/2007  . Allergic rhinitis 01/05/2007  .  CONSTIPATION, CHRONIC 01/05/2007  . Fibromyalgia syndrome 01/05/2007    Past Medical History:  Diagnosis Date  . Allergic rhinitis   . ANA positive   . Anemia    Iron deficiency  . Anxiety   . Cervicalgia    Chronic  . Complication of anesthesia    hard to go to sleep with Anesthesia-had to have Benadryl with Colonoscopy  . DDD (degenerative disc disease)    Cervical  . DJD (degenerative joint disease)   . Fibromyalgia   . GERD (gastroesophageal reflux disease)   . Hyperlipidemia   . Multinodular thyroid   . Neuropathy, peripheral   . Pneumonia   . Vitamin D deficiency 03/18/2011    Family History  Problem Relation Age of Onset  . Heart disease Father   . Sickle cell trait Unknown   . Lung cancer Unknown   . Colon cancer Unknown        Aunt  . Diabetes Mother   . Healthy Daughter   . Healthy Son   . Stomach cancer Neg Hx   . Rectal cancer Neg Hx   . Esophageal cancer Neg Hx    Past Surgical History:  Procedure Laterality Date  . ABDOMINAL HYSTERECTOMY    . ABDOMINAL HYSTERECTOMY     partial  . BUNIONECTOMY  2011   left foot  . DILATION AND CURETTAGE OF UTERUS    . KNEE ARTHROSCOPY  2004   right  . MYOMECTOMY  2002   prior to hysterectomy  . PARTIAL KNEE ARTHROPLASTY Right 10/07/2014   Procedure: RIGHT UNI KNEE ARTHROPLASTY LATERALLY ;  Surgeon: Paralee Cancel, MD;  Location: [facility];  Service: Orthopedics;  Laterality: Right;  . TONSILLECTOMY     age 14  . TOTAL SHOULDER ARTHROPLASTY Left 04/28/2017   Social History   Social History Narrative  . Not on file     Objective: Vital Signs: BP 122/74 (BP Location: Right Arm, Patient Position: Sitting, Cuff Size: Normal)   Pulse 69   Resp 15   Ht 5\' 3"  (1.6 m)   Wt 183 lb (83 kg)   BMI 32.42 kg/m    Physical Exam  Constitutional: She is oriented to person, place, and time. She appears well-developed and well-nourished.  HENT:  Head: Normocephalic and atraumatic.  Eyes: Conjunctivae and EOM are normal.    Neck: Normal range of motion.  Cardiovascular: Normal rate, regular rhythm, normal heart sounds and intact distal pulses.  Pulmonary/Chest: Effort normal and breath sounds normal.  Abdominal: Soft. Bowel sounds are normal.  Lymphadenopathy:    She has no cervical adenopathy.  Neurological: She is alert and oriented to person, place, and time.  Skin: Skin is warm and dry. Capillary refill takes less than 2 seconds.  Psychiatric: She has a normal mood and affect. Her behavior is normal.  Nursing note and vitals reviewed.    Musculoskeletal Exam: C-spine, thoracic spine, lumbar spine good range of motion.  No midline spinal tenderness.  No SI joint tenderness.  Left shoulder limited range of motion with some discomfort.  Right shoulder full range of motion.  Elbow joints, wrist joints, MCPs, PIPs, DIPs good range of motion no synovitis.  Hip joints, knee joints, ankle joints, MTPs, PIPs, DIPs good range of motion with no synovitis.  She  has a bunion on her right first MTP joint.  No warmth or effusion of knee joints.  Right partial knee replacement is doing well.  She has tenderness of bilateral trochanteric bursa.  CDAI Exam: No CDAI exam completed.    Investigation: No additional findings. CBC Latest Ref Rng & Units 10/18/2016 08/28/2016 03/26/2016  WBC 4.0 - 10.5 K/uL 4.4 6.1 4.1  Hemoglobin 12.0 - 15.0 g/dL 11.7(L) 11.3(L) 12.2  Hematocrit 36.0 - 46.0 % 36.9 36.0 38.1  Platelets 150.0 - 400.0 K/uL 332.0 397 356   CMP Latest Ref Rng & Units 10/18/2016 08/28/2016 03/26/2016  Glucose 70 - 99 mg/dL 91 134(H) 95  BUN 6 - 23 mg/dL 12 10 8   Creatinine 0.40 - 1.20 mg/dL 0.56 0.58 0.69  Sodium 135 - 145 mEq/L 138 139 139  Potassium 3.5 - 5.1 mEq/L 3.8 3.6 4.6  Chloride 96 - 112 mEq/L 101 104 104  CO2 19 - 32 mEq/L 30 27 25   Calcium 8.4 - 10.5 mg/dL 10.7(H) 9.8 10.3  Total Protein 6.0 - 8.3 g/dL 7.1 - 7.3  Total Bilirubin 0.2 - 1.2 mg/dL 0.3 - 0.4  Alkaline Phos 39 - 117 U/L 87 - 118  AST 0 -  37 U/L 17 - 22  ALT 0 - 35 U/L 13 - 17    Imaging: No results found.  Speciality Comments: No specialty comments available.    Procedures:  Large Joint Inj: bilateral greater trochanter on 06/02/2017 9:46 AM Indications: pain Details: 27 G 1.5 in needle, lateral approach  Arthrogram: No  Medications (Right): 1.5 mL lidocaine 1 %; 40 mg triamcinolone acetonide 40 MG/ML Aspirate (Right): 0 mL Medications (Left): 1.5 mL lidocaine 1 %; 40 mg triamcinolone acetonide 40 MG/ML Aspirate (Left): 0 mL Outcome: tolerated well, no immediate complications Procedure, treatment alternatives, risks and benefits explained, specific risks discussed. Consent was given by the patient. Immediately prior to procedure a time out was called to verify the correct patient, procedure, equipment, support staff and site/side marked as required. Patient was prepped and draped in the usual sterile fashion.     Allergies: Ampicillin; Duloxetine; and Penicillins   Assessment / Plan:     Visit Diagnoses: Fibromyalgia syndrome: Her fibromyalgia has been very well controlled.  She has been exercising on a regular basis and trying to lose weight.  She has increased energy since being more active lately.  Her insomnia has also improved.  She has no generalized muscle aches or muscle tenderness at this time.  She continues to take Lyrica 150 mg TID and Robaxin PRN.  Refills of both medications were sent to the pharmacy.  She just reactivated her [employer].  She is encouraged to continue to stay active.  Trochanteric bursitis of both hips: She has tenderness of bilateral trochanteric bursa.  She requested bilateral cortisone injections today.  She tolerated procedure well.  She was advised to continue to perform exercises at home on a regular basis.  Other fatigue: Improved.    Primary insomnia: Improved.   Primary osteoarthritis of left knee: Doing well.  She has good ROM.  No warmth or effusion of knee joint.     Status post right partial knee replacement: Doing well.  She has no discomfort in her right knee at this time.  No warmth or effusion.    DDD (degenerative disc disease), cervical: She has some limited ROM with lateral rotation to the left.  Her neck pain has improved significantly since her left shoulder arthroscopic surgery.  DDD (degenerative disc disease), lumbar: Chronic pain.    History of chronic pain  History of neuropathy - On pregabalin 150 mg by mouth 3 times a day.   Other medical conditions are listed as follows:  History of scoliosis  History of anxiety  History of vitamin D deficiency     Orders: Orders Placed This Encounter  Procedures  . Large Joint Inj   No orders of the defined types were placed in this encounter.   Face-to-face time spent with patient was 30 minutes. >50% of time was spent in counseling and coordination of care.  Follow-Up Instructions: Return in about 6 months (around 12/03/2017) for Fibromyalgia, Osteoarthritis.   Ofilia Neas, PA-C  I examined and evaluated the patient with Hazel Sams PA. The plan of care was discussed as noted above.  Bo Merino, MD  Note - This record has been created using Editor, commissioning.  Chart creation errors have been sought, but may not always  have been located. Such creation errors do not reflect on  the standard of medical care.

## 2017-05-23 DIAGNOSIS — M25512 Pain in left shoulder: Secondary | ICD-10-CM | POA: Diagnosis not present

## 2017-05-26 DIAGNOSIS — M25512 Pain in left shoulder: Secondary | ICD-10-CM | POA: Diagnosis not present

## 2017-05-30 DIAGNOSIS — M25512 Pain in left shoulder: Secondary | ICD-10-CM | POA: Diagnosis not present

## 2017-06-02 ENCOUNTER — Encounter: Payer: Self-pay | Admitting: Rheumatology

## 2017-06-02 ENCOUNTER — Ambulatory Visit: Payer: 59 | Admitting: Rheumatology

## 2017-06-02 VITALS — BP 122/74 | HR 69 | Resp 15 | Ht 63.0 in | Wt 183.0 lb

## 2017-06-02 DIAGNOSIS — Z8659 Personal history of other mental and behavioral disorders: Secondary | ICD-10-CM | POA: Diagnosis not present

## 2017-06-02 DIAGNOSIS — F5101 Primary insomnia: Secondary | ICD-10-CM | POA: Diagnosis not present

## 2017-06-02 DIAGNOSIS — Z8669 Personal history of other diseases of the nervous system and sense organs: Secondary | ICD-10-CM

## 2017-06-02 DIAGNOSIS — Z96651 Presence of right artificial knee joint: Secondary | ICD-10-CM

## 2017-06-02 DIAGNOSIS — M797 Fibromyalgia: Secondary | ICD-10-CM | POA: Diagnosis not present

## 2017-06-02 DIAGNOSIS — M5136 Other intervertebral disc degeneration, lumbar region: Secondary | ICD-10-CM

## 2017-06-02 DIAGNOSIS — Z8739 Personal history of other diseases of the musculoskeletal system and connective tissue: Secondary | ICD-10-CM | POA: Diagnosis not present

## 2017-06-02 DIAGNOSIS — M51369 Other intervertebral disc degeneration, lumbar region without mention of lumbar back pain or lower extremity pain: Secondary | ICD-10-CM

## 2017-06-02 DIAGNOSIS — M7062 Trochanteric bursitis, left hip: Secondary | ICD-10-CM

## 2017-06-02 DIAGNOSIS — M7061 Trochanteric bursitis, right hip: Secondary | ICD-10-CM

## 2017-06-02 DIAGNOSIS — Z87898 Personal history of other specified conditions: Secondary | ICD-10-CM | POA: Diagnosis not present

## 2017-06-02 DIAGNOSIS — R5383 Other fatigue: Secondary | ICD-10-CM | POA: Diagnosis not present

## 2017-06-02 DIAGNOSIS — M25512 Pain in left shoulder: Secondary | ICD-10-CM | POA: Diagnosis not present

## 2017-06-02 DIAGNOSIS — M5030 Other cervical disc degeneration, unspecified cervical region: Secondary | ICD-10-CM

## 2017-06-02 DIAGNOSIS — M1712 Unilateral primary osteoarthritis, left knee: Secondary | ICD-10-CM | POA: Diagnosis not present

## 2017-06-02 DIAGNOSIS — Z8639 Personal history of other endocrine, nutritional and metabolic disease: Secondary | ICD-10-CM

## 2017-06-02 MED ORDER — LIDOCAINE HCL 1 % IJ SOLN
1.5000 mL | INTRAMUSCULAR | Status: AC | PRN
  Administered 2017-06-02: 1.5 mL

## 2017-06-02 MED ORDER — METHOCARBAMOL 500 MG PO TABS: ORAL_TABLET | ORAL | 0 refills | Status: DC

## 2017-06-02 MED ORDER — TRIAMCINOLONE ACETONIDE 40 MG/ML IJ SUSP
40.0000 mg | INTRAMUSCULAR | Status: AC | PRN
  Administered 2017-06-02: 40 mg via INTRA_ARTICULAR

## 2017-06-02 MED ORDER — PREGABALIN 150 MG PO CAPS: 150.0000 mg | ORAL_CAPSULE | Freq: Three times a day (TID) | ORAL | 1 refills | Status: DC

## 2017-06-06 DIAGNOSIS — M25512 Pain in left shoulder: Secondary | ICD-10-CM | POA: Diagnosis not present

## 2017-06-09 DIAGNOSIS — M25512 Pain in left shoulder: Secondary | ICD-10-CM | POA: Diagnosis not present

## 2017-06-14 DIAGNOSIS — M25512 Pain in left shoulder: Secondary | ICD-10-CM | POA: Diagnosis not present

## 2017-06-17 DIAGNOSIS — M25512 Pain in left shoulder: Secondary | ICD-10-CM | POA: Diagnosis not present

## 2017-06-20 DIAGNOSIS — M25512 Pain in left shoulder: Secondary | ICD-10-CM | POA: Diagnosis not present

## 2017-06-23 DIAGNOSIS — M25512 Pain in left shoulder: Secondary | ICD-10-CM | POA: Diagnosis not present

## 2017-06-30 DIAGNOSIS — M25512 Pain in left shoulder: Secondary | ICD-10-CM | POA: Diagnosis not present

## 2017-07-04 DIAGNOSIS — M25512 Pain in left shoulder: Secondary | ICD-10-CM | POA: Diagnosis not present

## 2017-07-07 DIAGNOSIS — M25512 Pain in left shoulder: Secondary | ICD-10-CM | POA: Diagnosis not present

## 2017-07-12 DIAGNOSIS — M25512 Pain in left shoulder: Secondary | ICD-10-CM | POA: Diagnosis not present

## 2017-09-20 ENCOUNTER — Other Ambulatory Visit: Payer: Self-pay | Admitting: Internal Medicine

## 2017-09-20 ENCOUNTER — Other Ambulatory Visit: Payer: Self-pay | Admitting: Physician Assistant

## 2017-09-20 NOTE — Telephone Encounter (Signed)
Last visit: 06/02/17 Next Visit: 11/29/17  Okay to refill per Dr. Estanislado Pandy

## 2017-09-30 ENCOUNTER — Ambulatory Visit: Payer: 59 | Admitting: Internal Medicine

## 2017-09-30 ENCOUNTER — Encounter: Payer: Self-pay | Admitting: Internal Medicine

## 2017-09-30 VITALS — BP 122/86 | HR 93 | Temp 97.6°F | Ht 63.0 in | Wt 188.0 lb

## 2017-09-30 DIAGNOSIS — J069 Acute upper respiratory infection, unspecified: Secondary | ICD-10-CM | POA: Insufficient documentation

## 2017-09-30 DIAGNOSIS — R739 Hyperglycemia, unspecified: Secondary | ICD-10-CM | POA: Diagnosis not present

## 2017-09-30 DIAGNOSIS — J309 Allergic rhinitis, unspecified: Secondary | ICD-10-CM

## 2017-09-30 DIAGNOSIS — Z9109 Other allergy status, other than to drugs and biological substances: Secondary | ICD-10-CM

## 2017-09-30 MED ORDER — METHYLPREDNISOLONE ACETATE 80 MG/ML IJ SUSP
80.0000 mg | Freq: Once | INTRAMUSCULAR | Status: AC
  Administered 2017-09-30: 80 mg via INTRAMUSCULAR

## 2017-09-30 MED ORDER — AZITHROMYCIN 250 MG PO TABS: ORAL_TABLET | ORAL | 1 refills | Status: DC

## 2017-09-30 MED ORDER — HYDROCODONE-HOMATROPINE 5-1.5 MG/5ML PO SYRP
5.0000 mL | ORAL_SOLUTION | Freq: Four times a day (QID) | ORAL | 0 refills | Status: AC | PRN
Start: 2017-09-30 — End: 2017-10-10

## 2017-09-30 NOTE — Progress Notes (Signed)
Subjective:    Patient ID: Gabrielle Benson, female    DOB: Aug 26, 1955, 62 y.o.   MRN: [id number]  HPI   Here with 2-3 days acute onset fever, facial pain, pressure, headache, general weakness and malaise, and greenish d/c, with mild ST and cough, but pt denies chest pain, wheezing, increased sob or doe, orthopnea, PND, increased LE swelling, palpitations, dizziness or syncope.  Does have several wks ongoing nasal allergy symptoms with clearish congestion, itch and sneezing, without fever, pain, ST, cough, swelling or wheezing.  Pt denies new neurological symptoms such as new headache, or facial or extremity weakness or numbness   Pt denies polydipsia, polyuria Past Medical History:  Diagnosis Date  . Allergic rhinitis   . ANA positive   . Anemia    Iron deficiency  . Anxiety   . Cervicalgia    Chronic  . Complication of anesthesia    hard to go to sleep with Anesthesia-had to have Benadryl with Colonoscopy  . DDD (degenerative disc disease)    Cervical  . DJD (degenerative joint disease)   . Fibromyalgia   . GERD (gastroesophageal reflux disease)   . Hyperlipidemia   . Multinodular thyroid   . Neuropathy, peripheral   . Pneumonia   . Vitamin D deficiency 03/18/2011   Past Surgical History:  Procedure Laterality Date  . ABDOMINAL HYSTERECTOMY    . ABDOMINAL HYSTERECTOMY     partial  . BUNIONECTOMY  2011   left foot  . DILATION AND CURETTAGE OF UTERUS    . KNEE ARTHROSCOPY  2004   right  . MYOMECTOMY  2002   prior to hysterectomy  . PARTIAL KNEE ARTHROPLASTY Right 10/07/2014   Procedure: RIGHT UNI KNEE ARTHROPLASTY LATERALLY ;  Surgeon: Paralee Cancel, MD;  Location: [facility];  Service: Orthopedics;  Laterality: Right;  . TONSILLECTOMY     age 49  . TOTAL SHOULDER ARTHROPLASTY Left 04/28/2017    reports that she has never smoked. She has never used smokeless tobacco. She reports that she drinks about 3.0 - 4.0 standard drinks of alcohol per week. She reports that she does  not use drugs. family history includes Colon cancer in her unknown relative; Diabetes in her mother; Healthy in her daughter and son; Heart disease in her father; Lung cancer in her unknown relative; Sickle cell trait in her unknown relative. Allergies  Allergen Reactions  . Ampicillin   . Duloxetine     REACTION: dizzy, nervous  . Penicillins     Has patient had a PCN reaction causing immediate rash, facial/tongue/throat swelling, SOB or lightheadedness with hypotension: No Has patient had a PCN reaction causing severe rash involving mucus membranes or skin necrosis: No Has patient had a PCN reaction that required hospitalization: No Has patient had a PCN reaction occurring within the last 10 years: No If all of the above answers are "NO", then may proceed with Cephalosporin use.     Current Outpatient Medications on File Prior to Visit  Medication Sig Dispense Refill  . diclofenac sodium (VOLTAREN) 1 % GEL Apply 2 g topically 4 (four) times daily as needed (pain).     Marland Kitchen estrogens, conjugated, (PREMARIN) 0.625 MG tablet Take 0.625 mg by mouth daily.    . Menthol, Topical Analgesic, (BIOFREEZE EX) Apply 1 application topically daily as needed (joint pain).     . methocarbamol (ROBAXIN) 500 MG tablet TAKE 1 TABLET MOUTH 2 (TWO) TIMES DAILY AS NEEDED. 180 tablet 0  . oxyCODONE-acetaminophen (PERCOCET) 10-325 MG tablet  Take 1 tablet by mouth 3 (three) times daily as needed for severe pain.    . pantoprazole (PROTONIX) 40 MG tablet TAKE 1 TABLET BY MOUTH EVERY DAY 90 tablet 2  . pregabalin (LYRICA) 150 MG capsule Take 1 capsule (150 mg total) by mouth 3 (three) times daily. 270 capsule 1  . atorvastatin (LIPITOR) 20 MG tablet Take 1 tablet (20 mg total) by mouth daily. 90 tablet 3   No current facility-administered medications on file prior to visit.    Review of Systems  Constitutional: Negative for other unusual diaphoresis or sweats HENT: Negative for ear discharge or swelling Eyes:  Negative for other worsening visual disturbances Respiratory: Negative for stridor or other swelling  Gastrointestinal: Negative for worsening distension or other blood Genitourinary: Negative for retention or other urinary change Musculoskeletal: Negative for other MSK pain or swelling Skin: Negative for color change or other new lesions Neurological: Negative for worsening tremors and other numbness  Psychiatric/Behavioral: Negative for worsening agitation or other fatigue All other system neg per pt    Objective:   Physical Exam BP 122/86   Pulse 93   Temp 97.6 F (36.4 C) (Oral)   Ht 5\' 3"  (1.6 m)   Wt 188 lb (85.3 kg)   SpO2 98%   BMI 33.30 kg/m  VS noted, mild ill Constitutional: Pt appears in NAD HENT: Head: NCAT.  Right Ear: External ear normal.  Left Ear: External ear normal.  Bilat tm's with mild erythema.  Max sinus areas mild tender.  Pharynx with mild erythema, no exudate Eyes: . Pupils are equal, round, and reactive to light. Conjunctivae and EOM are normal Nose: without d/c or deformity Neck: Neck supple. Gross normal ROM Cardiovascular: Normal rate and regular rhythm.   Pulmonary/Chest: Effort normal and breath sounds decreased without rales or wheezing.  Neurological: Pt is alert. At baseline orientation, motor grossly intact Skin: Skin is warm. No rashes, other new lesions, no LE edema Psychiatric: Pt behavior is normal without agitation  No other exam findings Lab Results  Component Value Date   WBC 4.4 10/18/2016   HGB 11.7 (L) 10/18/2016   HCT 36.9 10/18/2016   PLT 332.0 10/18/2016   GLUCOSE 91 10/18/2016   CHOL 219 (H) 10/18/2016   TRIG 164.0 (H) 10/18/2016   HDL 58.10 10/18/2016   LDLDIRECT 122.7 07/01/2011   LDLCALC 128 (H) 10/18/2016   ALT 13 10/18/2016   AST 17 10/18/2016   NA 138 10/18/2016   K 3.8 10/18/2016   CL 101 10/18/2016   CREATININE 0.56 10/18/2016   BUN 12 10/18/2016   CO2 30 10/18/2016   TSH 0.88 10/18/2016   INR 0.94  10/01/2014   HGBA1C 6.1 10/18/2016      Assessment & Plan:

## 2017-09-30 NOTE — Patient Instructions (Signed)
You had the steroid shot today  Please take all new medication as prescribed - the antibiotic, and cough medicine If needed  Please continue all other medications as before, and refills have been done if requested.  Please have the pharmacy call with any other refills you may need.  Please keep your appointments with your specialists as you may have planned

## 2017-10-01 DIAGNOSIS — Z1231 Encounter for screening mammogram for malignant neoplasm of breast: Secondary | ICD-10-CM | POA: Diagnosis not present

## 2017-10-01 LAB — HM MAMMOGRAPHY

## 2017-10-01 NOTE — Assessment & Plan Note (Signed)
Mild to mod, for antibx course,  to f/u any worsening symptoms or concerns 

## 2017-10-01 NOTE — Assessment & Plan Note (Signed)
stable overall by history and exam, recent data reviewed with pt, and pt to continue medical treatment as before,  to f/u any worsening symptoms or concerns Lab Results  Component Value Date   HGBA1C 6.1 10/18/2016

## 2017-10-01 NOTE — Assessment & Plan Note (Signed)
Mild to mod, for depomedrol IM 80,  to f/u any worsening symptoms or concerns 

## 2017-10-18 ENCOUNTER — Encounter: Payer: Self-pay | Admitting: Internal Medicine

## 2017-10-18 ENCOUNTER — Other Ambulatory Visit (INDEPENDENT_AMBULATORY_CARE_PROVIDER_SITE_OTHER): Payer: 59

## 2017-10-18 ENCOUNTER — Ambulatory Visit (INDEPENDENT_AMBULATORY_CARE_PROVIDER_SITE_OTHER): Payer: 59 | Admitting: Internal Medicine

## 2017-10-18 VITALS — BP 116/74 | HR 67 | Temp 97.9°F | Ht 63.0 in | Wt 188.0 lb

## 2017-10-18 DIAGNOSIS — R739 Hyperglycemia, unspecified: Secondary | ICD-10-CM

## 2017-10-18 DIAGNOSIS — J309 Allergic rhinitis, unspecified: Secondary | ICD-10-CM | POA: Diagnosis not present

## 2017-10-18 DIAGNOSIS — Z0000 Encounter for general adult medical examination without abnormal findings: Secondary | ICD-10-CM

## 2017-10-18 DIAGNOSIS — J069 Acute upper respiratory infection, unspecified: Secondary | ICD-10-CM | POA: Diagnosis not present

## 2017-10-18 LAB — LIPID PANEL
CHOL/HDL RATIO: 3
Cholesterol: 218 mg/dL — ABNORMAL HIGH (ref 0–200)
HDL: 78.4 mg/dL (ref >39.00)
LDL Cholesterol: 126 mg/dL — ABNORMAL HIGH (ref 0–99)
NONHDL: 139.13
Triglycerides: 65 mg/dL (ref 0.0–149.0)
VLDL: 13 mg/dL (ref 0.0–40.0)

## 2017-10-18 LAB — CBC WITH DIFFERENTIAL/PLATELET
BASOS PCT: 0.9 % (ref 0.0–3.0)
Basophils Absolute: 0 10*3/uL (ref 0.0–0.1)
Eosinophils Absolute: 0.1 10*3/uL (ref 0.0–0.7)
Eosinophils Relative: 1.3 % (ref 0.0–5.0)
HEMATOCRIT: 35.2 % — AB (ref 36.0–46.0)
Hemoglobin: 11.5 g/dL — ABNORMAL LOW (ref 12.0–15.0)
LYMPHS PCT: 33.2 % (ref 12.0–46.0)
Lymphs Abs: 1.5 10*3/uL (ref 0.7–4.0)
MCHC: 32.6 g/dL (ref 30.0–36.0)
MCV: 83.7 fl (ref 78.0–100.0)
MONOS PCT: 7.3 % (ref 3.0–12.0)
Monocytes Absolute: 0.3 10*3/uL (ref 0.1–1.0)
NEUTROS ABS: 2.5 10*3/uL (ref 1.4–7.7)
Neutrophils Relative %: 57.3 % (ref 43.0–77.0)
PLATELETS: 344 10*3/uL (ref 150.0–400.0)
RBC: 4.21 Mil/uL (ref 3.87–5.11)
RDW: 13 % (ref 11.5–15.5)
WBC: 4.4 10*3/uL (ref 4.0–10.5)

## 2017-10-18 LAB — URINALYSIS, ROUTINE W REFLEX MICROSCOPIC
Bilirubin Urine: NEGATIVE
Hgb urine dipstick: NEGATIVE
Ketones, ur: NEGATIVE
Leukocytes, UA: NEGATIVE
NITRITE: NEGATIVE
PH: 7 (ref 5.0–8.0)
RBC / HPF: NONE SEEN (ref 0–?)
Specific Gravity, Urine: 1.015 (ref 1.000–1.030)
TOTAL PROTEIN, URINE-UPE24: NEGATIVE
Urine Glucose: NEGATIVE
Urobilinogen, UA: 0.2 (ref 0.0–1.0)

## 2017-10-18 LAB — BASIC METABOLIC PANEL
BUN: 8 mg/dL (ref 6–23)
CALCIUM: 10.4 mg/dL (ref 8.4–10.5)
CO2: 32 meq/L (ref 19–32)
Chloride: 102 mEq/L (ref 96–112)
Creatinine, Ser: 0.63 mg/dL (ref 0.40–1.20)
GFR: 123.83 mL/min (ref >60.00)
GLUCOSE: 90 mg/dL (ref 70–99)
Potassium: 4.1 mEq/L (ref 3.5–5.1)
SODIUM: 139 meq/L (ref 135–145)

## 2017-10-18 LAB — HEPATIC FUNCTION PANEL
ALT: 10 U/L (ref 0–35)
AST: 13 U/L (ref 0–37)
Albumin: 4.1 g/dL (ref 3.5–5.2)
Alkaline Phosphatase: 88 U/L (ref 39–117)
Bilirubin, Direct: 0 mg/dL (ref 0.0–0.3)
Total Bilirubin: 0.3 mg/dL (ref 0.2–1.2)
Total Protein: 7.4 g/dL (ref 6.0–8.3)

## 2017-10-18 LAB — HEMOGLOBIN A1C: HEMOGLOBIN A1C: 5.7 % (ref 4.6–6.5)

## 2017-10-18 LAB — TSH: TSH: 2.15 u[IU]/mL (ref 0.35–4.50)

## 2017-10-18 MED ORDER — METHYLPREDNISOLONE ACETATE 80 MG/ML IJ SUSP
80.0000 mg | Freq: Once | INTRAMUSCULAR | Status: AC
  Administered 2017-10-18: 80 mg via INTRAMUSCULAR

## 2017-10-18 MED ORDER — HYDROCOD POLST-CPM POLST ER 10-8 MG/5ML PO SUER: 5.0000 mL | Freq: Two times a day (BID) | ORAL | 0 refills | Status: DC | PRN

## 2017-10-18 NOTE — Assessment & Plan Note (Addendum)
Cont mucinex, nasal steroid per allergy, for depomedrol IM 80

## 2017-10-18 NOTE — Patient Instructions (Signed)
You had the steroid shot today  Please continue all other medications as before, and refills have been done if requested - tussionex  Please have the pharmacy call with any other refills you may need.  Please continue your efforts at being more active, low cholesterol diet, and weight control.  You are otherwise up to date with prevention measures today.  Please keep your appointments with your specialists as you may have planned  Please go to the LAB in the Basement (turn left off the elevator) for the tests to be done today  You will be contacted by phone if any changes need to be made immediately.  Otherwise, you will receive a letter about your results with an explanation, but please check with MyChart first.  Please remember to sign up for MyChart if you have not done so, as this will be important to you in the future with finding out test results, communicating by private email, and scheduling acute appointments online when needed.  Please return in 1 year for your yearly visit, or sooner if needed, with Lab testing done 3-5 days before

## 2017-10-18 NOTE — Assessment & Plan Note (Signed)
For a1c

## 2017-10-18 NOTE — Progress Notes (Signed)
Subjective:    Patient ID: Gabrielle Benson, female    DOB: 18-Apr-1955, 62 y.o.   MRN: [id number]  HPI  Here for wellness and f/u;  Overall doing ok;  Pt denies Chest pain, worsening SOB, DOE, wheezing, orthopnea, PND, worsening LE edema, palpitations, dizziness or syncope.  Pt denies neurological change such as new headache, facial or extremity weakness.  Pt denies polydipsia, polyuria, or low sugar symptoms. Pt states overall good compliance with treatment and medications, good tolerability, and has been trying to follow appropriate diet.  Pt denies worsening depressive symptoms, suicidal ideation or panic. No fever, night sweats, wt loss, loss of appetite, or other constitutional symptoms.  Pt states good ability with ADL's, has low fall risk, home safety reviewed and adequate, no other significant changes in hearing or vision, and only occasionally active with exercise. Mother died last 2022/12/25, and several extended family passed in the past month.   Wt Readings from Last 3 Encounters:  10/18/17 188 lb (85.3 kg)  09/30/17 188 lb (85.3 kg)  06/02/17 183 lb (83 kg)   Past Medical History:  Diagnosis Date  . Allergic rhinitis   . ANA positive   . Anemia    Iron deficiency  . Anxiety   . Cervicalgia    Chronic  . Complication of anesthesia    hard to go to sleep with Anesthesia-had to have Benadryl with Colonoscopy  . DDD (degenerative disc disease)    Cervical  . DJD (degenerative joint disease)   . Fibromyalgia   . GERD (gastroesophageal reflux disease)   . Hyperlipidemia   . Multinodular thyroid   . Neuropathy, peripheral   . Pneumonia   . Vitamin D deficiency 03/18/2011   Past Surgical History:  Procedure Laterality Date  . ABDOMINAL HYSTERECTOMY    . ABDOMINAL HYSTERECTOMY     partial  . BUNIONECTOMY  2011   left foot  . DILATION AND CURETTAGE OF UTERUS    . KNEE ARTHROSCOPY  2004   right  . MYOMECTOMY  2002   prior to hysterectomy  . PARTIAL KNEE ARTHROPLASTY Right  10/07/2014   Procedure: RIGHT UNI KNEE ARTHROPLASTY LATERALLY ;  Surgeon: Paralee Cancel, MD;  Location: [facility];  Service: Orthopedics;  Laterality: Right;  . TONSILLECTOMY     age 20  . TOTAL SHOULDER ARTHROPLASTY Left 04/28/2017    reports that she has never smoked. She has never used smokeless tobacco. She reports that she drinks about 3.0 - 4.0 standard drinks of alcohol per week. She reports that she does not use drugs. family history includes Colon cancer in her unknown relative; Diabetes in her mother; Healthy in her daughter and son; Heart disease in her father; Lung cancer in her unknown relative; Sickle cell trait in her unknown relative. Allergies  Allergen Reactions  . Ampicillin   . Duloxetine     REACTION: dizzy, nervous  . Penicillins     Has patient had a PCN reaction causing immediate rash, facial/tongue/throat swelling, SOB or lightheadedness with hypotension: No Has patient had a PCN reaction causing severe rash involving mucus membranes or skin necrosis: No Has patient had a PCN reaction that required hospitalization: No Has patient had a PCN reaction occurring within the last 10 years: No If all of the above answers are "NO", then may proceed with Cephalosporin use.     Current Outpatient Medications on File Prior to Visit  Medication Sig Dispense Refill  . diclofenac sodium (VOLTAREN) 1 % GEL Apply 2 g  topically 4 (four) times daily as needed (pain).     Marland Kitchen estrogens, conjugated, (PREMARIN) 0.625 MG tablet Take 0.625 mg by mouth daily.    . Menthol, Topical Analgesic, (BIOFREEZE EX) Apply 1 application topically daily as needed (joint pain).     . methocarbamol (ROBAXIN) 500 MG tablet TAKE 1 TABLET MOUTH 2 (TWO) TIMES DAILY AS NEEDED. 180 tablet 0  . oxyCODONE-acetaminophen (PERCOCET) 10-325 MG tablet Take 1 tablet by mouth 3 (three) times daily as needed for severe pain.    . pantoprazole (PROTONIX) 40 MG tablet TAKE 1 TABLET BY MOUTH EVERY DAY 90 tablet 2  .  pregabalin (LYRICA) 150 MG capsule Take 1 capsule (150 mg total) by mouth 3 (three) times daily. 270 capsule 1  . atorvastatin (LIPITOR) 20 MG tablet Take 1 tablet (20 mg total) by mouth daily. 90 tablet 3   No current facility-administered medications on file prior to visit.    Review of Systems Constitutional: Negative for other unusual diaphoresis, sweats, appetite or weight changes HENT: Negative for other worsening hearing loss, ear pain, facial swelling, mouth sores or neck stiffness.   Eyes: Negative for other worsening pain, redness or other visual disturbance.  Respiratory: Negative for other stridor or swelling Cardiovascular: Negative for other palpitations or other chest pain  Gastrointestinal: Negative for worsening diarrhea or loose stools, blood in stool, distention or other pain Genitourinary: Negative for hematuria, flank pain or other change in urine volume.  Musculoskeletal: Negative for myalgias or other joint swelling.  Skin: Negative for other color change, or other wound or worsening drainage.  Neurological: Negative for other syncope or numbness. Hematological: Negative for other adenopathy or swelling Psychiatric/Behavioral: Negative for hallucinations, other worsening agitation, SI, self-injury, or new decreased concentration All other system nge per pt    Objective:   Physical Exam BP 116/74   Pulse 67   Temp 97.9 F (36.6 C) (Oral)   Ht 5\' 3"  (1.6 m)   Wt 188 lb (85.3 kg)   SpO2 99%   BMI 33.30 kg/m  VS noted,  Constitutional: Pt is oriented to person, place, and time. Appears well-developed and well-nourished, in no significant distress and comfortable Head: Normocephalic and atraumatic  Eyes: Conjunctivae and EOM are normal. Pupils are equal, round, and reactive to light Right Ear: External ear normal without discharge Left Ear: External ear normal without discharge Nose: Nose without discharge or deformity Mouth/Throat: Oropharynx is without other  ulcerations and moist  Neck: Normal range of motion. Neck supple. No JVD present. No tracheal deviation present or significant neck LA or mass Cardiovascular: Normal rate, regular rhythm, normal heart sounds and intact distal pulses  Pulmonary/Chest: WOB normal and breath sounds without rales or wheezing  Abdominal: Soft. Bowel sounds are normal. NT. No HSM  Musculoskeletal: Normal range of motion. Exhibits no edema Lymphadenopathy: Has no other cervical adenopathy.  Neurological: Pt is alert and oriented to person, place, and time. Pt has normal reflexes. No cranial nerve deficit. Motor grossly intact, Gait intact Skin: Skin is warm and dry. No rash noted or new ulcerations Psychiatric:  Has nervous mood and affect. Behavior is normal without agitation No other exm findings Lab Results  Component Value Date   WBC 4.4 10/18/2016   HGB 11.7 (L) 10/18/2016   HCT 36.9 10/18/2016   PLT 332.0 10/18/2016   GLUCOSE 91 10/18/2016   CHOL 219 (H) 10/18/2016   TRIG 164.0 (H) 10/18/2016   HDL 58.10 10/18/2016   LDLDIRECT 122.7 07/01/2011  LDLCALC 128 (H) 10/18/2016   ALT 13 10/18/2016   AST 17 10/18/2016   NA 138 10/18/2016   K 3.8 10/18/2016   CL 101 10/18/2016   CREATININE 0.56 10/18/2016   BUN 12 10/18/2016   CO2 30 10/18/2016   TSH 0.88 10/18/2016   INR 0.94 10/01/2014   HGBA1C 6.1 10/18/2016         Assessment & Plan:

## 2017-10-18 NOTE — Assessment & Plan Note (Signed)
With residual cough - for refill tussionex prn

## 2017-10-18 NOTE — Assessment & Plan Note (Signed)

## 2017-10-20 ENCOUNTER — Telehealth: Payer: Self-pay

## 2017-10-20 NOTE — Telephone Encounter (Signed)
[employer] form from [employer] has been filled out and faxed back.   Form sent to scan.

## 2017-11-15 NOTE — Progress Notes (Deleted)
Office Visit Note  Patient: Gabrielle Benson             Date of Birth: [birth year]           MRN: [id number]             PCP: [name], MD Referring: [name], MD Visit Date: 11/29/2017 Occupation: @GUAROCC @  Subjective:  No chief complaint on file.   History of Present Illness: Gabrielle Benson is a 62 y.o. female ***   Activities of Daily Living:  Patient reports morning stiffness for *** {minute/hour:19697}.   Patient {ACTIONS;DENIES/REPORTS:21021675::"Denies"} nocturnal pain.  Difficulty dressing/grooming: {ACTIONS;DENIES/REPORTS:21021675::"Denies"} Difficulty climbing stairs: {ACTIONS;DENIES/REPORTS:21021675::"Denies"} Difficulty getting out of chair: {ACTIONS;DENIES/REPORTS:21021675::"Denies"} Difficulty using hands for taps, buttons, cutlery, and/or writing: {ACTIONS;DENIES/REPORTS:21021675::"Denies"}  No Rheumatology ROS completed.   PMFS History:  Patient Active Problem List   Diagnosis Date Noted  . Acute upper respiratory infection 09/30/2017  . Hyperglycemia 10/14/2016  . DJD (degenerative joint disease), cervical 10/05/2016  . DDD (degenerative disc disease), lumbar 10/05/2016  . Primary osteoarthritis of left knee 10/05/2016  . Acute left-sided low back pain 03/25/2016  . Pain, joint, multiple sites 03/25/2016  . History of insomnia 03/25/2016  . Greater trochanteric bursitis of both hips 03/25/2016  . Wheezing 02/18/2016  . Chronic pain syndrome 10/11/2015  . Memory dysfunction 04/01/2015  . Status post right partial knee replacement 10/07/2014  . Cough 11/02/2013  . Herpes labialis 11/02/2013  . GERD (gastroesophageal reflux disease) 11/02/2012  . Anxiety 07/01/2011  . Vitamin D deficiency 03/18/2011  . Preventative health care 03/13/2011  . PERIPHERAL NEUROPATHY 10/03/2007  . Hyperlipidemia 01/05/2007  . ANEMIA-IRON DEFICIENCY 01/05/2007  . INSOMNIA-SLEEP DISORDER-UNSPEC 01/05/2007  . Allergic rhinitis 01/05/2007  .  CONSTIPATION, CHRONIC 01/05/2007  . Fibromyalgia syndrome 01/05/2007    Past Medical History:  Diagnosis Date  . Allergic rhinitis   . ANA positive   . Anemia    Iron deficiency  . Anxiety   . Cervicalgia    Chronic  . Complication of anesthesia    hard to go to sleep with Anesthesia-had to have Benadryl with Colonoscopy  . DDD (degenerative disc disease)    Cervical  . DJD (degenerative joint disease)   . Fibromyalgia   . GERD (gastroesophageal reflux disease)   . Hyperlipidemia   . Multinodular thyroid   . Neuropathy, peripheral   . Pneumonia   . Vitamin D deficiency 03/18/2011    Family History  Problem Relation Age of Onset  . Heart disease Father   . Sickle cell trait Unknown   . Lung cancer Unknown   . Colon cancer Unknown        Aunt  . Diabetes Mother   . Healthy Daughter   . Healthy Son   . Stomach cancer Neg Hx   . Rectal cancer Neg Hx   . Esophageal cancer Neg Hx    Past Surgical History:  Procedure Laterality Date  . ABDOMINAL HYSTERECTOMY    . ABDOMINAL HYSTERECTOMY     partial  . BUNIONECTOMY  2011   left foot  . DILATION AND CURETTAGE OF UTERUS    . KNEE ARTHROSCOPY  2004   right  . MYOMECTOMY  2002   prior to hysterectomy  . PARTIAL KNEE ARTHROPLASTY Right 10/07/2014   Procedure: RIGHT UNI KNEE ARTHROPLASTY LATERALLY ;  Surgeon: Paralee Cancel, MD;  Location: [facility];  Service: Orthopedics;  Laterality: Right;  . TONSILLECTOMY     age 41  . TOTAL SHOULDER ARTHROPLASTY Left  04/28/2017   Social History   Social History Narrative  . Not on file    Objective: Vital Signs: There were no vitals taken for this visit.   Physical Exam   Musculoskeletal Exam: ***  CDAI Exam: CDAI Score: Not documented Patient Global Assessment: Not documented; Provider Global Assessment: Not documented Swollen: Not documented; Tender: Not documented Joint Exam   Not documented   There is currently no information documented on the homunculus. Go to the  Rheumatology activity and complete the homunculus joint exam.  Investigation: No additional findings.  Imaging: No results found.  Recent Labs: Lab Results  Component Value Date   WBC 4.4 10/18/2017   HGB 11.5 (L) 10/18/2017   PLT 344.0 10/18/2017   NA 139 10/18/2017   K 4.1 10/18/2017   CL 102 10/18/2017   CO2 32 10/18/2017   GLUCOSE 90 10/18/2017   BUN 8 10/18/2017   CREATININE 0.63 10/18/2017   BILITOT 0.3 10/18/2017   ALKPHOS 88 10/18/2017   AST 13 10/18/2017   ALT 10 10/18/2017   PROT 7.4 10/18/2017   ALBUMIN 4.1 10/18/2017   CALCIUM 10.4 10/18/2017   GFRAA >60 08/28/2016    Speciality Comments: No specialty comments available.  Procedures:  No procedures performed Allergies: Ampicillin; Duloxetine; and Penicillins   Assessment / Plan:     Visit Diagnoses: No diagnosis found.   Orders: No orders of the defined types were placed in this encounter.  No orders of the defined types were placed in this encounter.   Face-to-face time spent with patient was *** minutes. Greater than 50% of time was spent in counseling and coordination of care.  Follow-Up Instructions: No follow-ups on file.   Earnestine Mealing, CMA  Note - This record has been created using Editor, commissioning.  Chart creation errors have been sought, but may not always  have been located. Such creation errors do not reflect on  the standard of medical care.

## 2017-11-29 ENCOUNTER — Ambulatory Visit: Payer: 59 | Admitting: Physician Assistant

## 2018-01-03 ENCOUNTER — Ambulatory Visit: Payer: Self-pay

## 2018-01-03 MED ORDER — BENZONATATE 100 MG PO CAPS: 100.0000 mg | ORAL_CAPSULE | Freq: Three times a day (TID) | ORAL | 1 refills | Status: DC | PRN

## 2018-01-03 NOTE — Addendum Note (Signed)
Addended by: [name] on: 01/03/2018 01:27 PM   Modules accepted: Orders

## 2018-01-03 NOTE — Telephone Encounter (Signed)
Unfortunately the tussionex is not a routine refillable medication such as medications for BP or sugar are.  Due to the controlled substance nature of the medication, I should not prescribe without documentation in the chart.  I can send tessalon perle howevere - done erx

## 2018-01-03 NOTE — Telephone Encounter (Signed)
Patient called in with c/o "cough." She says "the cough started about a week ago and I've tried Mucinex cough and some cold tablets from here at work, but nothing is helping the cough. The only thing that will stop it is Tussinex. I am not able to come in to the office due to my job, not until after the first of the year, so if Dr. Jenny Reichmann would call me in some Tussinex, the cough will be gone after a few doses. I usually get a cold about twice a year. I am taking the azithromycin he gave me a prescription for, but don't have the cough medicine." I asked about coughing up phlegm, blood, she says "it's a dry, hacking cough." I advised I will send this over to Dr. Jenny Reichmann for review and recommendation. She would like to be called at work, if needed.   Reason for Disposition . Cough  Answer Assessment - Initial Assessment Questions 1. ONSET: "When did the cough begin?"      1 week ago 2. SEVERITY: "How bad is the cough today?"      It's a dry, hacking cough that once it starts, it's hard to stop 3. RESPIRATORY DISTRESS: "Describe your breathing."      Breathing is fine 4. FEVER: "Do you have a fever?" If so, ask: "What is your temperature, how was it measured, and when did it start?"     No 5. HEMOPTYSIS: "Are you coughing up any blood?" If so ask: "How much?" (flecks, streaks, tablespoons, etc.)     No 6. TREATMENT: "What have you done so far to treat the cough?" (e.g., meds, fluids, humidifier)     OTC Mucinex cough medicine, cold tablets 7. CARDIAC HISTORY: "Do you have any history of heart disease?" (e.g., heart attack, congestive heart failure)      No 8. LUNG HISTORY: "Do you have any history of lung disease?"  (e.g., pulmonary embolus, asthma, emphysema)     No 9. PE RISK FACTORS: "Do you have a history of blood clots?" (or: recent major surgery, recent prolonged travel, bedridden)     No 10. OTHER SYMPTOMS: "Do you have any other symptoms? (e.g., runny nose, wheezing, chest pain)        No 11. PREGNANCY: "Is there any chance you are pregnant?" "When was your last menstrual period?"       No 12. TRAVEL: "Have you traveled out of the country in the last month?" (e.g., travel history, exposures)       No  Protocols used: COUGH - ACUTE NON-PRODUCTIVE-A-AH

## 2018-01-03 NOTE — Telephone Encounter (Signed)
See below

## 2018-02-22 ENCOUNTER — Telehealth: Payer: Self-pay | Admitting: Rheumatology

## 2018-02-22 MED ORDER — DICLOFENAC SODIUM 1 % TD GEL: TRANSDERMAL | 2 refills | Status: AC

## 2018-02-22 NOTE — Progress Notes (Signed)
Office Visit Note  Patient: Gabrielle Benson             Date of Birth: 01-17-1956           MRN: [id number]             PCP: [name], MD Referring: [name], MD Visit Date: 03/02/2018 Occupation: @GUAROCC @  Subjective:  Trochanteric bursitis bilaterally   History of Present Illness: Gabrielle Benson is a 63 y.o. female with history of fibromyalgia, osteoarthritis, and DDD.  Patient is on Lyrica 150 mg 3 times daily and Robaxin 500 mg BID PRN for muscle spasms. She has been having bilateral trochanteric bursitis.  She has not been exercising or stretching recently.  She has been having more lower extremity muscle cramps recently.  She has not been performing IT band or trochanter bursa stretching exercises since December 2019.  She reports that she has been experiencing pain at night.  She takes Aleve about 3 times weekly at bedtime to help with pain relief.  She reports that she has been sleeping well overall but has some difficulty falling asleep at night.  She states that her fatigue level has been stable.  She reports that her right knee partial placement is doing well.  She states that she does use Voltaren gel topically on her knee joints when they cause increased discomfort.  She states her neck and lower back is been doing well.  She states she does have trapezius muscle tension muscle tenderness bilaterally.  She reports that she has bilateral epicondylitis.  She states that she has increased myalgias with weather changes.  She reports that she plans on starting to go to water aerobics and exercising on a regular basis.   Activities of Daily Living:  Patient reports morning stiffness for 2-3 hours.   Patient Reports nocturnal pain.  Difficulty dressing/grooming: Denies Difficulty climbing stairs: Denies Difficulty getting out of chair: Denies Difficulty using hands for taps, buttons, cutlery, and/or writing: Denies  Review of Systems  Constitutional: Positive for  fatigue.  HENT: Negative for mouth sores, mouth dryness and nose dryness.   Eyes: Negative for pain, visual disturbance and dryness.  Respiratory: Negative for cough, hemoptysis, shortness of breath and difficulty breathing.   Cardiovascular: Negative for chest pain, palpitations, hypertension and swelling in legs/feet.  Gastrointestinal: Positive for constipation (IBS). Negative for blood in stool and diarrhea.  Endocrine: Negative for increased urination.  Genitourinary: Negative for painful urination.  Musculoskeletal: Positive for arthralgias, joint pain, myalgias, morning stiffness, muscle tenderness and myalgias. Negative for joint swelling and muscle weakness.  Skin: Negative for color change, pallor, rash, hair loss, nodules/bumps, skin tightness, ulcers and sensitivity to sunlight.  Allergic/Immunologic: Negative for susceptible to infections.  Neurological: Negative for dizziness, numbness, headaches and weakness.  Hematological: Negative for swollen glands.  Psychiatric/Behavioral: Positive for depressed mood and sleep disturbance. The patient is not nervous/anxious.     PMFS History:  Patient Active Problem List   Diagnosis Date Noted  . Acute upper respiratory infection 09/30/2017  . Hyperglycemia 10/14/2016  . DJD (degenerative joint disease), cervical 10/05/2016  . DDD (degenerative disc disease), lumbar 10/05/2016  . Primary osteoarthritis of left knee 10/05/2016  . Acute left-sided low back pain 03/25/2016  . Pain, joint, multiple sites 03/25/2016  . History of insomnia 03/25/2016  . Greater trochanteric bursitis of both hips 03/25/2016  . Wheezing 02/18/2016  . Chronic pain syndrome 10/11/2015  . Memory dysfunction 04/01/2015  . Status post right partial  knee replacement 10/07/2014  . Cough 11/02/2013  . Herpes labialis 11/02/2013  . GERD (gastroesophageal reflux disease) 11/02/2012  . Anxiety 07/01/2011  . Vitamin D deficiency 03/18/2011  . Preventative health  care 03/13/2011  . PERIPHERAL NEUROPATHY 10/03/2007  . Hyperlipidemia 01/05/2007  . ANEMIA-IRON DEFICIENCY 01/05/2007  . INSOMNIA-SLEEP DISORDER-UNSPEC 01/05/2007  . Allergic rhinitis 01/05/2007  . CONSTIPATION, CHRONIC 01/05/2007  . Fibromyalgia syndrome 01/05/2007    Past Medical History:  Diagnosis Date  . Allergic rhinitis   . ANA positive   . Anemia    Iron deficiency  . Anxiety   . Cervicalgia    Chronic  . Complication of anesthesia    hard to go to sleep with Anesthesia-had to have Benadryl with Colonoscopy  . DDD (degenerative disc disease)    Cervical  . DJD (degenerative joint disease)   . Fibromyalgia   . GERD (gastroesophageal reflux disease)   . Hyperlipidemia   . Multinodular thyroid   . Neuropathy, peripheral   . Pneumonia   . Vitamin D deficiency 03/18/2011    Family History  Problem Relation Age of Onset  . Heart disease Father   . Sickle cell trait Other   . Lung cancer Other   . Colon cancer Other        Aunt  . Diabetes Mother   . Healthy Daughter   . Healthy Son   . Stomach cancer Neg Hx   . Rectal cancer Neg Hx   . Esophageal cancer Neg Hx    Past Surgical History:  Procedure Laterality Date  . ABDOMINAL HYSTERECTOMY    . ABDOMINAL HYSTERECTOMY     partial  . BUNIONECTOMY  2011   left foot  . DILATION AND CURETTAGE OF UTERUS    . KNEE ARTHROSCOPY  2004   right  . MYOMECTOMY  2002   prior to hysterectomy  . PARTIAL KNEE ARTHROPLASTY Right 10/07/2014   Procedure: RIGHT UNI KNEE ARTHROPLASTY LATERALLY ;  Surgeon: Paralee Cancel, MD;  Location: [facility];  Service: Orthopedics;  Laterality: Right;  . TONSILLECTOMY     age 66  . TOTAL SHOULDER ARTHROPLASTY Left 04/28/2017   Social History   Social History Narrative  . Not on file   Immunization History  Administered Date(s) Administered  . Influenza Whole 11/22/2006  . Td 01/19/1992, 09/06/2014  . Tdap 07/01/2011     Objective: Vital Signs: BP (!) 133/91 (BP Location: Right Arm,  Patient Position: Sitting, Cuff Size: Normal)   Pulse 66   Resp 13   Ht 5' 3.5" (1.613 m)   Wt 185 lb (83.9 kg)   BMI 32.26 kg/m    Physical Exam Vitals signs and nursing note reviewed.  Constitutional:      Appearance: She is well-developed.  HENT:     Head: Normocephalic and atraumatic.  Eyes:     Conjunctiva/sclera: Conjunctivae normal.  Neck:     Musculoskeletal: Normal range of motion.  Cardiovascular:     Rate and Rhythm: Normal rate and regular rhythm.     Heart sounds: Normal heart sounds.  Pulmonary:     Effort: Pulmonary effort is normal.     Breath sounds: Normal breath sounds.  Abdominal:     General: Bowel sounds are normal.     Palpations: Abdomen is soft.  Lymphadenopathy:     Cervical: No cervical adenopathy.  Skin:    General: Skin is warm and dry.     Capillary Refill: Capillary refill takes less than 2 seconds.  Neurological:     Mental Status: She is alert and oriented to person, place, and time.  Psychiatric:        Behavior: Behavior normal.      Musculoskeletal Exam: C-spine limited ROM.  Thoracic and lumbar spine good ROM.  No midline spinal tenderness.  No SI joint tenderness.  Shoulder joints, elbow joints, wrist joints, MCPs, PIPs, and DIPs good ROM with no synovitis.  She has complete fist formation bilaterally.  Hip joints, knee joints, ankle joints, MCPs, PIPs and DIPs good range of motion no synovitis.  No warmth or effusion bilateral knee joints.  No tenderness or swelling of ankle joints.  She has tenderness over bilateral trochanter bursa and IT bands.  CDAI Exam: CDAI Score: Not documented Patient Global Assessment: Not documented; Provider Global Assessment: Not documented Swollen: Not documented; Tender: Not documented Joint Exam   Not documented   There is currently no information documented on the homunculus. Go to the Rheumatology activity and complete the homunculus joint exam.  Investigation: No additional  findings.  Imaging: No results found.  Recent Labs: Lab Results  Component Value Date   WBC 4.4 10/18/2017   HGB 11.5 (L) 10/18/2017   PLT 344.0 10/18/2017   NA 139 10/18/2017   K 4.1 10/18/2017   CL 102 10/18/2017   CO2 32 10/18/2017   GLUCOSE 90 10/18/2017   BUN 8 10/18/2017   CREATININE 0.63 10/18/2017   BILITOT 0.3 10/18/2017   ALKPHOS 88 10/18/2017   AST 13 10/18/2017   ALT 10 10/18/2017   PROT 7.4 10/18/2017   ALBUMIN 4.1 10/18/2017   CALCIUM 10.4 10/18/2017   GFRAA >60 08/28/2016    Speciality Comments: No specialty comments available.  Procedures:  Large Joint Inj: L greater trochanter on 03/02/2018 2:05 PM Indications: pain Details: 27 G 1.5 in needle, lateral approach  Arthrogram: No  Medications: 40 mg triamcinolone acetonide 40 MG/ML; 1.5 mL lidocaine 1 % Aspirate: 0 mL Outcome: tolerated well, no immediate complications Procedure, treatment alternatives, risks and benefits explained, specific risks discussed. Consent was given by the patient. Immediately prior to procedure a time out was called to verify the correct patient, procedure, equipment, support staff and site/side marked as required. Patient was prepped and draped in the usual sterile fashion.     Allergies: Ampicillin; Duloxetine; and Penicillins   Assessment / Plan:     Visit Diagnoses: Fibromyalgia syndrome -she has generalized hyperalgesia and positive tender points on exam.  She is been having increased generalized muscle aches muscle tenderness.  She has bilateral trochanteric bursitis worse on the left side.  Of left trochanter bursitis cortisone injection was performed today.  She tolerated the procedure well.  The procedure note is completed above.  She continues to take Lyrica 150 mg TID and Robaxin 500 mg BID PRN for muscle spasms.  Refills were sent to the pharmacy. She has not exercised since December 2019.  We discussed the importance of regular exercise and stretching.  She plans on  starting going back to exercise in the pool.  She was given a handout of trochanter bursitis and IT band syndrome exercises as well.  She continues to have chronic fatigue but has been stable.  She has some difficulty falling asleep but usually stays asleep at night.  Good sleep hygiene was discussed.  Other fatigue: Stable  Primary insomnia: She has difficulty falling asleep but stays asleep.  Good sleep hygiene was discussed.  Primary osteoarthritis of left knee: No warmth or effusion.  Good range of motion.  She is occasional discomfort and uses Voltaren gel topically which provides significant pain relief.  Status post right partial knee replacement: Good range of motion.  No warmth or effusion.  She has no discomfort at this time.  She is Voltaren gel topically PRN.  Greater trochanteric bursitis of both hips: She has tenderness over bilateral trochanter bursa.  The discomfort is most severe on the left side.  She requested a cortisone injection today.  She tolerated procedure well.  Due to her blood pressure being elevated we did not want to perform bilateral cortisone injections.  She will schedule an appointment to have the right side injected once her blood pressure is better controlled.  She was given a handout of exercises that she can perform at home.  DDD (degenerative disc disease), cervical: She has good range of motion with some discomfort.  She has no symptoms of radiculopathy at this time.  DDD (degenerative disc disease), lumbar: She has intermittent discomfort in her lower back.  Overall her lower back pain has improved.  She takes Robaxin 500 mg twice daily PRN for muscle spasms.  Other medical conditions are listed as follows:  History of chronic pain  History of neuropathy  History of scoliosis  History of anxiety  History of vitamin D deficiency   Orders: Orders Placed This Encounter  Procedures  . Large Joint Inj   Meds ordered this encounter  Medications   . pregabalin (LYRICA) 150 MG capsule    Sig: Take 1 capsule (150 mg total) by mouth 3 (three) times daily.    Dispense:  270 capsule    Refill:  0  . methocarbamol (ROBAXIN) 500 MG tablet    Sig: TAKE 1 TABLET MOUTH 2 (TWO) TIMES DAILY AS NEEDED.    Dispense:  180 tablet    Refill:  0    Follow-Up Instructions: Return in about 6 months (around 08/31/2018) for Fibromyalgia, Osteoarthritis, DDD.   Ofilia Neas, PA-C  Note - This record has been created using Dragon software.  Chart creation errors have been sought, but may not always  have been located. Such creation errors do not reflect on  the standard of medical care.

## 2018-02-22 NOTE — Telephone Encounter (Signed)
Last visit: 06/02/17 Next Visit: 03/02/18  Okay to refill per Dr. Estanislado Pandy

## 2018-02-22 NOTE — Telephone Encounter (Signed)
Patient request a refill on Voltaren Gel sent to [facility] on [address]

## 2018-03-02 ENCOUNTER — Encounter: Payer: Self-pay | Admitting: Physician Assistant

## 2018-03-02 ENCOUNTER — Ambulatory Visit: Payer: 59 | Admitting: Physician Assistant

## 2018-03-02 VITALS — BP 133/91 | HR 66 | Resp 13 | Ht 63.5 in | Wt 185.0 lb

## 2018-03-02 DIAGNOSIS — M51369 Other intervertebral disc degeneration, lumbar region without mention of lumbar back pain or lower extremity pain: Secondary | ICD-10-CM

## 2018-03-02 DIAGNOSIS — F5101 Primary insomnia: Secondary | ICD-10-CM | POA: Diagnosis not present

## 2018-03-02 DIAGNOSIS — M797 Fibromyalgia: Secondary | ICD-10-CM | POA: Diagnosis not present

## 2018-03-02 DIAGNOSIS — R5383 Other fatigue: Secondary | ICD-10-CM | POA: Diagnosis not present

## 2018-03-02 DIAGNOSIS — Z87898 Personal history of other specified conditions: Secondary | ICD-10-CM

## 2018-03-02 DIAGNOSIS — M1712 Unilateral primary osteoarthritis, left knee: Secondary | ICD-10-CM

## 2018-03-02 DIAGNOSIS — M5136 Other intervertebral disc degeneration, lumbar region: Secondary | ICD-10-CM

## 2018-03-02 DIAGNOSIS — M7061 Trochanteric bursitis, right hip: Secondary | ICD-10-CM

## 2018-03-02 DIAGNOSIS — M7062 Trochanteric bursitis, left hip: Secondary | ICD-10-CM | POA: Diagnosis not present

## 2018-03-02 DIAGNOSIS — Z8659 Personal history of other mental and behavioral disorders: Secondary | ICD-10-CM

## 2018-03-02 DIAGNOSIS — Z8669 Personal history of other diseases of the nervous system and sense organs: Secondary | ICD-10-CM

## 2018-03-02 DIAGNOSIS — Z8739 Personal history of other diseases of the musculoskeletal system and connective tissue: Secondary | ICD-10-CM

## 2018-03-02 DIAGNOSIS — Z8639 Personal history of other endocrine, nutritional and metabolic disease: Secondary | ICD-10-CM

## 2018-03-02 DIAGNOSIS — M5030 Other cervical disc degeneration, unspecified cervical region: Secondary | ICD-10-CM

## 2018-03-02 DIAGNOSIS — Z96651 Presence of right artificial knee joint: Secondary | ICD-10-CM

## 2018-03-02 MED ORDER — TRIAMCINOLONE ACETONIDE 40 MG/ML IJ SUSP
40.0000 mg | INTRAMUSCULAR | Status: AC | PRN
  Administered 2018-03-02: 40 mg via INTRA_ARTICULAR

## 2018-03-02 MED ORDER — METHOCARBAMOL 500 MG PO TABS: ORAL_TABLET | ORAL | 0 refills | Status: DC

## 2018-03-02 MED ORDER — PREGABALIN 150 MG PO CAPS: 150.0000 mg | ORAL_CAPSULE | Freq: Three times a day (TID) | ORAL | 0 refills | Status: DC

## 2018-03-02 MED ORDER — LIDOCAINE HCL 1 % IJ SOLN
1.5000 mL | INTRAMUSCULAR | Status: AC | PRN
  Administered 2018-03-02: 1.5 mL

## 2018-03-02 NOTE — Patient Instructions (Signed)
Iliotibial Band Syndrome Rehab Ask your health care provider which exercises are safe for you. Do exercises exactly as told by your health care provider and adjust them as directed. It is normal to feel mild stretching, pulling, tightness, or discomfort as you do these exercises, but you should stop right away if you feel sudden pain or your pain gets worse.Do not begin these exercises until told by your health care provider. Stretching and range of motion exercises These exercises warm up your muscles and joints and improve the movement and flexibility of your hip and pelvis. Exercise A: Quadriceps, prone  1. Lie on your abdomen on a firm surface, such as a bed or padded floor. 2. Bend your left / right knee and hold your ankle. If you cannot reach your ankle or pant leg, loop a belt around your foot and grab the belt instead. 3. Gently pull your heel toward your buttocks. Your knee should not slide out to the side. You should feel a stretch in the front of your thigh and knee. 4. Hold this position for __________ seconds. Repeat __________ times. Complete this stretch __________ times a day. Exercise B: Iliotibial band  1. Lie on your side with your left / right leg in the top position. 2. Bend both of your knees and grab your left / right ankle. Stretch out your bottom arm to help you balance. 3. Slowly bring your top knee back so your thigh goes behind your trunk. 4. Slowly lower your top leg toward the floor until you feel a gentle stretch on the outside of your left / right hip and thigh. If you do not feel a stretch and your knee will not fall farther, place the heel of your other foot on top of your knee and pull your knee down toward the floor with your foot. 5. Hold this position for __________ seconds. Repeat __________ times. Complete this stretch __________ times a day. Strengthening exercises These exercises build strength and endurance in your hip and pelvis. Endurance is the  ability to use your muscles for a long time, even after they get tired. Exercise C: Straight leg raises (hip abductors)  1. Lie on your side with your left / right leg in the top position. Lie so your head, shoulder, knee, and hip line up. You may bend your bottom knee to help you balance. 2. Roll your hips slightly forward so your hips are stacked directly over each other and your left / right knee is facing forward. 3. Tense the muscles in your outer thigh and lift your top leg 4-6 inches (10-15 cm). 4. Hold this position for __________ seconds. 5. Slowly return to the starting position. Let your muscles relax completely before doing another repetition. Repeat __________ times. Complete this exercise __________ times a day. Exercise D: Straight leg raises (hip extensors) 1. Lie on your abdomen on your bed or a firm surface. You can put a pillow under your hips if that is more comfortable. 2. Bend your left / right knee so your foot is straight up in the air. 3. Squeeze your buttock muscles and lift your left / right thigh off the bed. Do not let your back arch. 4. Tense this muscle as hard as you can without increasing any knee pain. 5. Hold this position for __________ seconds. 6. Slowly lower your leg to the starting position and allow it to relax completely. Repeat __________ times. Complete this exercise __________ times a day. Exercise E: Hip hike  1. Stand sideways on a bottom step. Stand on your left / right leg with your other foot unsupported next to the step. You can hold onto the railing or wall if needed for balance. 2. Keep your knees straight and your torso square. Then, lift your left / right hip up toward the ceiling. 3. Slowly let your left / right hip lower toward the floor, past the starting position. Your foot should get closer to the floor. Do not lean or bend your knees. Repeat __________ times. Complete this exercise __________ times a day. This information is not  intended to replace advice given to you by your health care provider. Make sure you discuss any questions you have with your health care provider. Document Released: 01/04/2005 Document Revised: 09/09/2015 Document Reviewed: 12/06/2014 Elsevier Interactive Patient Education  2019 [employer] Trochanteric Bursitis Rehab Ask your health care provider which exercises are safe for you. Do exercises exactly as told by your health care provider and adjust them as directed. It is normal to feel mild stretching, pulling, tightness, or discomfort as you do these exercises, but you should stop right away if you feel sudden pain or your pain gets worse.Do not begin these exercises until told by your health care provider. Stretching exercises These exercises warm up your muscles and joints and improve the movement and flexibility of your hip. These exercises also help to relieve pain and stiffness. Exercise A: Iliotibial band stretch  5. Lie on your side with your left / right leg in the top position. 6. Bend your left / right knee and grab your ankle. 7. Slowly bring your knee back so your thigh is behind your body. 8. Slowly lower your knee toward the floor until you feel a gentle stretch on the outside of your left / right thigh. If you do not feel a stretch and your knee will not fall farther, place the heel of your other foot on top of your outer knee and pull your thigh down farther. 9. Hold this position for __________ seconds. 10. Slowly return to the starting position. Repeat __________ times. Complete this exercise __________ times a day. Strengthening exercises These exercises build strength and endurance in your hip and pelvis. Endurance is the ability to use your muscles for a long time, even after they get tired. Exercise B: Bridge (hip extensors)  6. Lie on your back on a firm surface with your knees bent and your feet flat on the floor. 7. Tighten your buttocks muscles and lift your  buttocks off the floor until your trunk is level with your thighs. You should feel the muscles working in your buttocks and the back of your thighs. If this exercise is too easy, try doing it with your arms crossed over your chest. 8. Hold this position for __________ seconds. 9. Slowly return to the starting position. 10. Let your muscles relax completely between repetitions. Repeat __________ times. Complete this exercise __________ times a day. Exercise C: Squats (knee extensors and  quadriceps) 1. Stand in front of a table, with your feet and knees pointing straight ahead. You may rest your hands on the table for balance but not for support. 2. Slowly bend your knees and lower your hips like you are going to sit in a chair. ? Keep your weight over your heels, not over your toes. ? Keep your lower legs upright so they are parallel with the table legs. ? Do not let your hips go lower than your knees. ?   Do not bend lower than told by your health care provider. ? If your hip pain increases, do not bend as low. 3. Hold this position for __________ seconds. 4. Slowly push with your legs to return to standing. Do not use your hands to pull yourself to standing. Repeat __________ times. Complete this exercise __________ times a day. Exercise D: Hip hike 7. Stand sideways on a bottom step. Stand on your left / right leg with your other foot unsupported next to the step. You can hold onto the railing or wall if needed for balance. 8. Keeping your knees straight and your torso square, lift your left / right hip up toward the ceiling. 9. Hold this position for __________ seconds. 10. Slowly let your left / right hip lower toward the floor, past the starting position. Your foot should get closer to the floor. Do not lean or bend your knees. Repeat __________ times. Complete this exercise __________ times a day. Exercise E: Single leg stand 4. Stand near a counter or door frame that you can hold onto  for balance as needed. It is helpful to stand in front of a mirror for this exercise so you can watch your hip. 5. Squeeze your left / right buttock muscles then lift up your other foot. Do not let your left / right hip push out to the side. 6. Hold this position for __________ seconds. Repeat __________ times. Complete this exercise __________ times a day. This information is not intended to replace advice given to you by your health care provider. Make sure you discuss any questions you have with your health care provider. Document Released: 02/12/2004 Document Revised: 09/11/2015 Document Reviewed: 12/20/2014 Elsevier Interactive Patient Education  2019 [employer]  

## 2018-03-06 ENCOUNTER — Ambulatory Visit: Payer: 59 | Admitting: Physician Assistant

## 2018-03-06 VITALS — BP 151/87 | HR 82

## 2018-03-06 DIAGNOSIS — M7061 Trochanteric bursitis, right hip: Secondary | ICD-10-CM

## 2018-03-06 MED ORDER — TRIAMCINOLONE ACETONIDE 40 MG/ML IJ SUSP
40.0000 mg | INTRAMUSCULAR | Status: AC | PRN
  Administered 2018-03-06: 40 mg via INTRA_ARTICULAR

## 2018-03-06 MED ORDER — LIDOCAINE HCL 1 % IJ SOLN
1.5000 mL | INTRAMUSCULAR | Status: AC | PRN
  Administered 2018-03-06: 1.5 mL

## 2018-03-06 NOTE — Progress Notes (Signed)
   Procedure Note  Patient: Gabrielle Benson             Date of Birth: 06/22/1955           MRN: [id number]             Visit Date: 03/06/2018  Procedures: Visit Diagnoses: Trochanteric bursitis, right hip - Plan: Large Joint Inj: R greater trochanter  Large Joint Inj: R greater trochanter on 03/06/2018 9:45 AM Indications: pain Details: 27 G 1.5 in needle, lateral approach  Arthrogram: No  Medications: 40 mg triamcinolone acetonide 40 MG/ML; 1.5 mL lidocaine 1 % Aspirate: 0 mL Outcome: tolerated well, no immediate complications Procedure, treatment alternatives, risks and benefits explained, specific risks discussed. Consent was given by the patient. Immediately prior to procedure a time out was called to verify the correct patient, procedure, equipment, support staff and site/side marked as required. Patient was prepped and draped in the usual sterile fashion.     Patient tolerated the procedure well.  Hazel Sams, PA-C

## 2018-03-30 ENCOUNTER — Ambulatory Visit: Payer: 59 | Admitting: Family Medicine

## 2018-04-01 ENCOUNTER — Other Ambulatory Visit: Payer: Self-pay

## 2018-04-01 ENCOUNTER — Ambulatory Visit: Payer: 59 | Admitting: Family Medicine

## 2018-04-01 VITALS — BP 152/91 | HR 82 | Temp 98.4°F | Resp 16 | Ht 63.5 in | Wt 183.0 lb

## 2018-04-01 DIAGNOSIS — R05 Cough: Secondary | ICD-10-CM

## 2018-04-01 DIAGNOSIS — R059 Cough, unspecified: Secondary | ICD-10-CM

## 2018-04-01 DIAGNOSIS — J309 Allergic rhinitis, unspecified: Secondary | ICD-10-CM | POA: Diagnosis not present

## 2018-04-01 MED ORDER — HYDROCOD POLST-CPM POLST ER 10-8 MG/5ML PO SUER: 5.0000 mL | Freq: Two times a day (BID) | ORAL | 0 refills | Status: DC | PRN

## 2018-04-01 NOTE — Progress Notes (Signed)
Subjective:    Patient ID: Gabrielle Benson, female    DOB: [birth year], 63 y.o.   MRN: [id number]  HPI   Patient presents to clinic complaining of dry cough since the end of February.  States she usually get some sort of dry cough every year around this time when the weather begins to warm up.  Denies any fever or chills.  Denies body aches.  Has no recent travel or contact with anyone under suspicion or being tested for coronavirus.  Patient does take an allergy medication every day, and uses a nasal spray as recommended by ENT.  States only thing that helps her to feel better is Desenex cough syrup as it allows her to get some rest at night.  Patient Active Problem List   Diagnosis Date Noted  . Acute upper respiratory infection 09/30/2017  . Hyperglycemia 10/14/2016  . DJD (degenerative joint disease), cervical 10/05/2016  . DDD (degenerative disc disease), lumbar 10/05/2016  . Primary osteoarthritis of left knee 10/05/2016  . Acute left-sided low back pain 03/25/2016  . Pain, joint, multiple sites 03/25/2016  . History of insomnia 03/25/2016  . Greater trochanteric bursitis of both hips 03/25/2016  . Wheezing 02/18/2016  . Chronic pain syndrome 10/11/2015  . Memory dysfunction 04/01/2015  . Status post right partial knee replacement 10/07/2014  . Cough 11/02/2013  . Herpes labialis 11/02/2013  . GERD (gastroesophageal reflux disease) 11/02/2012  . Anxiety 07/01/2011  . Vitamin D deficiency 03/18/2011  . Preventative health care 03/13/2011  . PERIPHERAL NEUROPATHY 10/03/2007  . Hyperlipidemia 01/05/2007  . ANEMIA-IRON DEFICIENCY 01/05/2007  . INSOMNIA-SLEEP DISORDER-UNSPEC 01/05/2007  . Allergic rhinitis 01/05/2007  . CONSTIPATION, CHRONIC 01/05/2007  . Fibromyalgia syndrome 01/05/2007   Social History   Tobacco Use  . Smoking status: Never Smoker  . Smokeless tobacco: Never Used  Substance Use Topics  . Alcohol use: Yes    Alcohol/week: 3.0 - 4.0 standard  drinks    Types: 3 - 4 Glasses of wine per week   Review of Systems  Constitutional: Negative for chills, fatigue and fever.  HENT: +mild nasal congestion. Negative for ear pain, sinus pain and sore throat.   Eyes: Negative.   Respiratory: +dry cough. Negative for shortness of breath and wheezing.   Cardiovascular: Negative for chest pain, palpitations and leg swelling.  Gastrointestinal: Negative for abdominal pain, diarrhea, nausea and vomiting.  Genitourinary: Negative for dysuria, frequency and urgency.  Musculoskeletal: Negative for arthralgias and myalgias.  Skin: Negative for color change, pallor and rash.  Neurological: Negative for syncope, light-headedness and headaches.  Psychiatric/Behavioral: The patient is not nervous/anxious.       Objective:   Physical Exam Vitals signs and nursing note reviewed.  Constitutional:      General: She is not in acute distress.    Appearance: She is not toxic-appearing.  HENT:     Head: Normocephalic and atraumatic.     Ears:     Comments: Mild fullness bilat TMs    Nose: Congestion and rhinorrhea present.     Comments: Clear drainage, some post nasal drip    Mouth/Throat:     Mouth: Mucous membranes are moist.     Pharynx: No oropharyngeal exudate or posterior oropharyngeal erythema.  Eyes:     General: No scleral icterus.       Right eye: No discharge.        Left eye: No discharge.     Extraocular Movements: Extraocular movements intact.     Conjunctiva/sclera: Conjunctivae  normal.     Pupils: Pupils are equal, round, and reactive to light.  Neck:     Musculoskeletal: Neck supple. No neck rigidity.  Cardiovascular:     Rate and Rhythm: Normal rate and regular rhythm.  Pulmonary:     Effort: Pulmonary effort is normal. No respiratory distress.     Breath sounds: Normal breath sounds. No wheezing, rhonchi or rales.  Lymphadenopathy:     Cervical: No cervical adenopathy.  Skin:    General: Skin is warm and dry.      Coloration: Skin is not jaundiced or pale.  Neurological:     Mental Status: She is alert and oriented to person, place, and time.  Psychiatric:        Mood and Affect: Mood normal.        Behavior: Behavior normal.     Vitals:   04/01/18 1026  BP: (!) 152/91  Pulse: 82  Resp: 16  Temp: 98.4 F (36.9 C)  SpO2: 96%      Assessment & Plan:   Cough/allergic rhinitis- patient's lungs are clear on exam, suspect cough is related to postnasal drainage from allergies.  Advised to continue allergy medication nasal spray as she already has been using.  Advised to rest, keep good fluid intake and do good handwashing.  Desenex cough syrup prescribed, [facility] PMP registry checked and is appropriate for this prescription.  Advised to also try saline nasal rinses to help reduce sinus congestion/postnasal drainage.  Patient will keep regular scheduled follow-up with PCP as planned and return to clinic sooner if any issues arise.

## 2018-04-04 ENCOUNTER — Ambulatory Visit: Payer: 59 | Admitting: Internal Medicine

## 2018-04-14 ENCOUNTER — Ambulatory Visit: Payer: Self-pay

## 2018-04-14 ENCOUNTER — Ambulatory Visit: Payer: 59 | Admitting: Internal Medicine

## 2018-04-14 NOTE — Telephone Encounter (Signed)
Pt called wanting something for a lingering cough.  She states the cough is dry.  She say her tonsils have been removed but she describes swollen glands she can see at the back of her throat.  Web Ex ok with patient. Please advise.  Reason for Disposition . Cough has been present for > 3 weeks  Answer Assessment - Initial Assessment Questions 1. ONSET: "When did the cough begin?"      October 2019 allergy related 2. SEVERITY: "How bad is the cough today?"      mild 3. RESPIRATORY DISTRESS: "Describe your breathing."      fine 4. FEVER: "Do you have a fever?" If so, ask: "What is your temperature, how was it measured, and when did it start?"     no 5. HEMOPTYSIS: "Are you coughing up any blood?" If so ask: "How much?" (flecks, streaks, tablespoons, etc.)     no 6. TREATMENT: "What have you done so far to treat the cough?" (e.g., meds, fluids, humidifier)     tussinex with codeine, mucinex 7. CARDIAC HISTORY: "Do you have any history of heart disease?" (e.g., heart attack, congestive heart failure)      no 8. LUNG HISTORY: "Do you have any history of lung disease?"  (e.g., pulmonary embolus, asthma, emphysema)     no 9. PE RISK FACTORS: "Do you have a history of blood clots?" (or: recent major surgery, recent prolonged travel, bedridden)     no 10. OTHER SYMPTOMS: "Do you have any other symptoms? (e.g., runny nose, wheezing, chest pain)       Glands in throat are swollen no sore throat 11. PREGNANCY: "Is there any chance you are pregnant?" "When was your last menstrual period?"       N/A 12. TRAVEL: "Have you traveled out of the country in the last month?" (e.g., travel history, exposures)       No  Protocols used: COUGH - ACUTE NON-PRODUCTIVE-A-AH

## 2018-04-24 ENCOUNTER — Ambulatory Visit (INDEPENDENT_AMBULATORY_CARE_PROVIDER_SITE_OTHER): Payer: 59 | Admitting: Internal Medicine

## 2018-04-24 ENCOUNTER — Encounter: Payer: Self-pay | Admitting: Internal Medicine

## 2018-04-24 DIAGNOSIS — R739 Hyperglycemia, unspecified: Secondary | ICD-10-CM

## 2018-04-24 DIAGNOSIS — J309 Allergic rhinitis, unspecified: Secondary | ICD-10-CM | POA: Diagnosis not present

## 2018-04-24 DIAGNOSIS — J069 Acute upper respiratory infection, unspecified: Secondary | ICD-10-CM

## 2018-04-24 MED ORDER — AZITHROMYCIN 250 MG PO TABS: ORAL_TABLET | ORAL | 1 refills | Status: DC

## 2018-04-24 MED ORDER — PREDNISONE 10 MG PO TABS: ORAL_TABLET | ORAL | 0 refills | Status: DC

## 2018-04-24 NOTE — Assessment & Plan Note (Signed)
stable overall by history and exam, recent data reviewed with pt, and pt to continue medical treatment as before,  to f/u any worsening symptoms or concerns  

## 2018-04-24 NOTE — Progress Notes (Signed)
Virtual Visit via Video Note  I connected with Gabrielle Benson on 04/24/18 at  2:00 PM EDT by a video enabled telemedicine application and verified that I am speaking with the correct person using two identifiers.  Pt is at home, I am in office, and no other persons present   I discussed the limitations of evaluation and management by telemedicine and the availability of in person appointments. The patient expressed understanding and agreed to proceed.  History of Present Illness:  Here with 2-3 days acute onset fever, facial pain, pressure, headache, general weakness and malaise, and greenish d/c, with mild ST and cough, but pt denies chest pain, wheezing, increased sob or doe, orthopnea, PND, increased LE swelling, palpitations, dizziness or syncope.  Does have several wks ongoing nasal allergy symptoms with clearish congestion, itch and sneezing, without fever, pain, ST, cough, swelling or wheezing.   Pt denies polydipsia, polyuria No other new complaints Past Medical History:  Diagnosis Date  . Allergic rhinitis   . ANA positive   . Anemia    Iron deficiency  . Anxiety   . Cervicalgia    Chronic  . Complication of anesthesia    hard to go to sleep with Anesthesia-had to have Benadryl with Colonoscopy  . DDD (degenerative disc disease)    Cervical  . DJD (degenerative joint disease)   . Fibromyalgia   . GERD (gastroesophageal reflux disease)   . Hyperlipidemia   . Multinodular thyroid   . Neuropathy, peripheral   . Pneumonia   . Vitamin D deficiency 03/18/2011   Past Surgical History:  Procedure Laterality Date  . ABDOMINAL HYSTERECTOMY    . ABDOMINAL HYSTERECTOMY     partial  . BUNIONECTOMY  2011   left foot  . DILATION AND CURETTAGE OF UTERUS    . KNEE ARTHROSCOPY  2004   right  . MYOMECTOMY  2002   prior to hysterectomy  . PARTIAL KNEE ARTHROPLASTY Right 10/07/2014   Procedure: RIGHT UNI KNEE ARTHROPLASTY LATERALLY ;  Surgeon: Paralee Cancel, MD;  Location: [facility];   Service: Orthopedics;  Laterality: Right;  . TONSILLECTOMY     age 63  . TOTAL SHOULDER ARTHROPLASTY Left 04/28/2017    reports that she has never smoked. She has never used smokeless tobacco. She reports current alcohol use of about 3.0 - 4.0 standard drinks of alcohol per week. She reports that she does not use drugs. family history includes Colon cancer in an other family member; Diabetes in her mother; Healthy in her daughter and son; Heart disease in her father; Lung cancer in an other family member; Sickle cell trait in an other family member. Allergies  Allergen Reactions  . Ampicillin   . Duloxetine     REACTION: dizzy, nervous  . Penicillins     Has patient had a PCN reaction causing immediate rash, facial/tongue/throat swelling, SOB or lightheadedness with hypotension: No Has patient had a PCN reaction causing severe rash involving mucus membranes or skin necrosis: No Has patient had a PCN reaction that required hospitalization: No Has patient had a PCN reaction occurring within the last 10 years: No If all of the above answers are "NO", then may proceed with Cephalosporin use.     Current Outpatient Medications on File Prior to Visit  Medication Sig Dispense Refill  . atorvastatin (LIPITOR) 20 MG tablet Take 1 tablet (20 mg total) by mouth daily. (Patient not taking: Reported on 03/02/2018) 90 tablet 3  . chlorpheniramine-HYDROcodone (TUSSIONEX PENNKINETIC ER) 10-8 MG/5ML SUER  Take 5 mLs by mouth every 12 (twelve) hours as needed. 140 mL 0  . diclofenac sodium (VOLTAREN) 1 % GEL Apply 2-4 grams to affected joint up to 4 times a day as needed 4 Tube 2  . Menthol, Topical Analgesic, (BIOFREEZE EX) Apply 1 application topically daily as needed (joint pain).     . methocarbamol (ROBAXIN) 500 MG tablet TAKE 1 TABLET MOUTH 2 (TWO) TIMES DAILY AS NEEDED. 180 tablet 0  . naproxen sodium (ALEVE) 220 MG tablet Take 220 mg by mouth as needed.    Marland Kitchen oxyCODONE-acetaminophen (PERCOCET) 10-325  MG tablet Take 1 tablet by mouth 3 (three) times daily as needed for severe pain.    . pantoprazole (PROTONIX) 40 MG tablet TAKE 1 TABLET BY MOUTH EVERY DAY 90 tablet 2  . pregabalin (LYRICA) 150 MG capsule Take 1 capsule (150 mg total) by mouth 3 (three) times daily. 270 capsule 0   No current facility-administered medications on file prior to visit.     Observations/Objective: Mild ill apearing, alert, mentating well, o/w NAD, cn 2-12 intact, resps normal Lab Results  Component Value Date   WBC 4.4 10/18/2017   HGB 11.5 (L) 10/18/2017   HCT 35.2 (L) 10/18/2017   PLT 344.0 10/18/2017   GLUCOSE 90 10/18/2017   CHOL 218 (H) 10/18/2017   TRIG 65.0 10/18/2017   HDL 78.40 10/18/2017   LDLDIRECT 122.7 07/01/2011   LDLCALC 126 (H) 10/18/2017   ALT 10 10/18/2017   AST 13 10/18/2017   NA 139 10/18/2017   K 4.1 10/18/2017   CL 102 10/18/2017   CREATININE 0.63 10/18/2017   BUN 8 10/18/2017   CO2 32 10/18/2017   TSH 2.15 10/18/2017   INR 0.94 10/01/2014   HGBA1C 5.7 10/18/2017   Assessment and Plan: See notes  Follow Up Instructions: See notes   I discussed the assessment and treatment plan with the patient. The patient was provided an opportunity to ask questions and all were answered. The patient agreed with the plan and demonstrated an understanding of the instructions.   The patient was advised to call back or seek an in-person evaluation if the symptoms worsen or if the condition fails to improve as anticipated.  Cathlean Cower, MD

## 2018-04-24 NOTE — Assessment & Plan Note (Signed)
Mild to mod, for predpac asd, cont other meds as per allergy including the xyzal, astelin, mucinex,  to f/u any worsening symptoms or concerns

## 2018-04-24 NOTE — Patient Instructions (Signed)
See above

## 2018-04-24 NOTE — Assessment & Plan Note (Signed)
Mild to mod, for antibx course,  to f/u any worsening symptoms or concerns 

## 2018-05-11 DIAGNOSIS — Z01419 Encounter for gynecological examination (general) (routine) without abnormal findings: Secondary | ICD-10-CM | POA: Diagnosis not present

## 2018-05-11 DIAGNOSIS — Z6833 Body mass index (BMI) 33.0-33.9, adult: Secondary | ICD-10-CM | POA: Diagnosis not present

## 2018-05-29 ENCOUNTER — Ambulatory Visit: Payer: 59 | Admitting: Internal Medicine

## 2018-05-29 DIAGNOSIS — Z20828 Contact with and (suspected) exposure to other viral communicable diseases: Secondary | ICD-10-CM | POA: Diagnosis not present

## 2018-06-01 ENCOUNTER — Encounter: Payer: Self-pay | Admitting: Internal Medicine

## 2018-06-01 ENCOUNTER — Ambulatory Visit (INDEPENDENT_AMBULATORY_CARE_PROVIDER_SITE_OTHER): Payer: 59 | Admitting: Internal Medicine

## 2018-06-01 ENCOUNTER — Other Ambulatory Visit: Payer: Self-pay

## 2018-06-01 VITALS — Ht 63.5 in | Wt 187.0 lb

## 2018-06-01 DIAGNOSIS — K581 Irritable bowel syndrome with constipation: Secondary | ICD-10-CM

## 2018-06-01 DIAGNOSIS — R10813 Right lower quadrant abdominal tenderness: Secondary | ICD-10-CM

## 2018-06-01 NOTE — Progress Notes (Signed)
TELEHEALTH ENCOUNTER IN SETTING OF COVID-19 PANDEMIC - REQUESTED BY PATIENT SERVICE PROVIDED BY TELEMEDECINE - TYPE: Zoom A/V PATIENT LOCATION: Work PATIENT HAS CONSENTED TO TELEHEALTH VISIT PROVIDER LOCATION: OFFICE REFERRING PROVIDER: Primary care provider is Dr. Jenny Reichmann I believe she was self-referred PARTICIPANTS OTHER THAN PATIENT: None TIME SPENT ON CALL: 25 minutes   Lorianne Goodall-Rorie 63 y.o. September 18, 1955 [id number]  Assessment & Plan:   Encounter Diagnoses  Name Primary?   Right lower quadrant abdominal tenderness, rebound tenderness presence not specified Yes   Irritable bowel syndrome with constipation     Trial of Linzess to 90 mcg daily, samples given.  This is not covered by her insurance but if it works perhaps we can get prior authorization.  She has failed Amitiza and Motegrity is not covered by her insurance as well.  Another option might be discount card for nonformulary medicines.  She will come back to the office for an in person visit within 2 to 4 weeks so I can examine her abdomen as well as perform a functional rectal exam.  I think the tenderness may very well be related to obstipation/constipation.  She should have a colonoscopy with a better prep, we will discuss scheduling that as well.   Subjective:   Chief Complaint: Lower abdominal tenderness  HPI The patient is a 63 year old woman with a history of chronic constipation/IBS constipation predominant previously followed by Dr. Verl Blalock, she was seeing gynecology recently and on palpation she had tenderness in the right lower quadrant more so than the left lower quadrant.  Her gynecologist told her "those are your bowels".  She still struggles with constipation, she had a colonoscopy and EGD in 2014, they were unremarkable though the colonoscopy prep was poor.  At that time she was on Amitiza which did not seem to help if she remembers correctly.  More recently she takes some Linzess that she gets  from a friend, she believes that the 290 mcg, she will use that intermittently but will often take 2 doses over 36 hours to provide defecation, the same is true if she uses Dulcolax.  She is not having bleeding or anything like that.  She does not use chronic narcotics or any other medicines that I see that would necessarily delay transit.  She does not have significant urologic i.e. urinary issues with urination she is not suffered birth trauma etc.  She will go days without defecation unless she takes a laxative or Linzess.  She feels bloated and sore at times.  She really does not get bothered by the tenderness but after that exam she thought she would get follow-up. Allergies  Allergen Reactions   Ampicillin    Duloxetine     REACTION: dizzy, nervous   Penicillins     Has patient had a PCN reaction causing immediate rash, facial/tongue/throat swelling, SOB or lightheadedness with hypotension: No Has patient had a PCN reaction causing severe rash involving mucus membranes or skin necrosis: No Has patient had a PCN reaction that required hospitalization: No Has patient had a PCN reaction occurring within the last 10 years: No If all of the above answers are "NO", then may proceed with Cephalosporin use.     Current Meds  Medication Sig   diclofenac sodium (VOLTAREN) 1 % GEL Apply 2-4 grams to affected joint up to 4 times a day as needed   Menthol, Topical Analgesic, (BIOFREEZE EX) Apply 1 application topically daily as needed (joint pain).    methocarbamol (ROBAXIN)  500 MG tablet TAKE 1 TABLET MOUTH 2 (TWO) TIMES DAILY AS NEEDED.   naproxen sodium (ALEVE) 220 MG tablet Take 220 mg by mouth as needed.   oxyCODONE-acetaminophen (PERCOCET) 10-325 MG tablet Take 1 tablet by mouth 3 (three) times daily as needed for severe pain.   pantoprazole (PROTONIX) 40 MG tablet TAKE 1 TABLET BY MOUTH EVERY DAY   pregabalin (LYRICA) 150 MG capsule Take 1 capsule (150 mg total) by mouth 3 (three)  times daily.   Past Medical History:  Diagnosis Date   Allergic rhinitis    ANA positive    Anemia    Iron deficiency   Anxiety    Cervicalgia    Chronic   Complication of anesthesia    hard to go to sleep with Anesthesia-had to have Benadryl with Colonoscopy   DDD (degenerative disc disease)    Cervical   DJD (degenerative joint disease)    Fibromyalgia    GERD (gastroesophageal reflux disease)    Hyperlipidemia    Multinodular thyroid    Neuropathy, peripheral    Pneumonia    Vitamin D deficiency 03/18/2011   Past Surgical History:  Procedure Laterality Date   ABDOMINAL HYSTERECTOMY     ABDOMINAL HYSTERECTOMY     partial   BUNIONECTOMY  2011   left foot   DILATION AND CURETTAGE OF UTERUS     KNEE ARTHROSCOPY  2004   right   MYOMECTOMY  2002   prior to hysterectomy   PARTIAL KNEE ARTHROPLASTY Right 10/07/2014   Procedure: RIGHT UNI KNEE ARTHROPLASTY LATERALLY ;  Surgeon: Paralee Cancel, MD;  Location: [facility];  Service: Orthopedics;  Laterality: Right;   TONSILLECTOMY     age 26   TOTAL SHOULDER ARTHROPLASTY Left 04/28/2017   Social History   Social History Narrative   Married 2 kids   Receptionist at [employer]   family history includes Colon cancer in an other family member; Diabetes in her mother; Healthy in her daughter and son; Heart disease in her father; Lung cancer in an other family member; Sickle cell trait in an other family member.   Review of Systems As per HPI.  All other review of systems appear negative at this time.

## 2018-06-01 NOTE — Patient Instructions (Addendum)
Good to meet you by Zoom today.  Please pick up the Linzess samples I left for you, take one capsule each AM on an empty stomach.  We are located at [address] , 3rd floor of [place] [facility].  We will also set up a follow-up visit in person so that I may examine you. Please call us to set this up or set it up when you pick up the samples, for 2-4 weeks from now.   I appreciate the opportunity to care for you. Gatha Mayer, MD, [facility]

## 2018-06-15 ENCOUNTER — Other Ambulatory Visit: Payer: Self-pay | Admitting: Physician Assistant

## 2018-06-15 ENCOUNTER — Other Ambulatory Visit: Payer: Self-pay | Admitting: Internal Medicine

## 2018-06-15 NOTE — Telephone Encounter (Signed)
ok 

## 2018-06-15 NOTE — Telephone Encounter (Signed)
Last Visit: 03/06/18 Next visit: 08/31/18  Okay to refill Lyrica and Methocarbamol?

## 2018-07-11 ENCOUNTER — Ambulatory Visit: Payer: 59 | Admitting: Internal Medicine

## 2018-07-11 ENCOUNTER — Telehealth: Payer: Self-pay

## 2018-07-11 NOTE — Telephone Encounter (Signed)
  Called patient to Covid screen her for today's appointment and she had forgotten to write it on the calendar and now can't get off work. She has r/s'ed to 07/19/2018 at 11:30AM.

## 2018-07-18 ENCOUNTER — Telehealth: Payer: Self-pay

## 2018-07-18 NOTE — Telephone Encounter (Signed)
Covid-19 screening questions   Do you now or have you had a fever in the last 14 days? no  Do you have any respiratory symptoms of shortness of breath or cough now or in the last 14 days? allergies   Do you have any family members or close contacts with diagnosed or suspected Covid-19 in the past 14 days? no  Have you been tested for Covid-19 and found to be positive? no

## 2018-07-19 ENCOUNTER — Ambulatory Visit: Payer: 59 | Admitting: Internal Medicine

## 2018-07-19 ENCOUNTER — Encounter: Payer: Self-pay | Admitting: Internal Medicine

## 2018-07-19 ENCOUNTER — Telehealth: Payer: Self-pay | Admitting: Internal Medicine

## 2018-07-19 VITALS — BP 122/66 | HR 74 | Temp 97.6°F | Ht 63.5 in | Wt 188.0 lb

## 2018-07-19 DIAGNOSIS — R10813 Right lower quadrant abdominal tenderness: Secondary | ICD-10-CM

## 2018-07-19 DIAGNOSIS — K581 Irritable bowel syndrome with constipation: Secondary | ICD-10-CM

## 2018-07-19 DIAGNOSIS — Z1211 Encounter for screening for malignant neoplasm of colon: Secondary | ICD-10-CM | POA: Diagnosis not present

## 2018-07-19 DIAGNOSIS — N393 Stress incontinence (female) (male): Secondary | ICD-10-CM | POA: Diagnosis not present

## 2018-07-19 MED ORDER — NA SULFATE-K SULFATE-MG SULF 17.5-3.13-1.6 GM/177ML PO SOLN: 1.0000 | Freq: Once | ORAL | 0 refills | Status: AC

## 2018-07-19 MED ORDER — LINACLOTIDE 290 MCG PO CAPS: 290.0000 ug | ORAL_CAPSULE | Freq: Every day | ORAL | 3 refills | Status: DC

## 2018-07-19 NOTE — Telephone Encounter (Signed)
Medication Refill - Medication: azithromycin (ZITHROMAX Z-PAK) 250 MG tablet [[id number]] (Pharmacy stated that they need provider to sign off on medication.)   Has the patient contacted their pharmacy?Yes (Agent: If no, request that the patient contact the pharmacy for the refill.) (Agent: If yes, when and what did the pharmacy advise?)Contact PCP  Preferred Pharmacy (with phone number or street name):  [id number] [address], [place] [phone] (Phone) [phone] (Fax)     Agent: Please be advised that RX refills may take up to 3 business days. We ask that you follow-up with your pharmacy.

## 2018-07-19 NOTE — Progress Notes (Signed)
Gabrielle Benson 63 y.o. 1955/11/15 [id number]  Assessment & Plan:   Encounter Diagnoses  Name Primary?  . Right lower quadrant abdominal tenderness, rebound tenderness presence not specified Yes  . Irritable bowel syndrome with constipation   . Colon cancer screening   . Urinary, incontinence, stress female     Continue Linzess and use savings card.  Hopefully that will be long-term since this is not on her formulary.  Screening colonoscopy as last one had poor prep.  Use extra MiraLAX and Dulcolax the day before prep day and Suprep on prep today.  Consider pelvic PT for GU and GI sxs   I appreciate the opportunity to care for this patient. CC: Gabrielle Borg, MD   Subjective:   Chief Complaint: constipation and RLQ tenderness HPI The patient is here for in person follow-up, telehealth visit about a month or so ago for chronic constipation problems.  Linzess 290 mcg daily is helping her move her bowels quite well.  She says she has been constipated ever since she was a child.  She also has a focal tenderness in the right lower quadrant that is similar.  Not affected by defecation.  There is additional history of stress urinary incontinence she wears a pad, it is much worse if she is standing and has a large sneezing attack.  She has done Cagle exercises, her gynecologist, Dr. Garwin Benson has recommended she consider pelvic floor physical therapy but she has not pursued that.  See my note from 06/01/2018 for additional details.  Previous screening colonoscopy had a poor prep. Allergies  Allergen Reactions  . Ampicillin   . Duloxetine     REACTION: dizzy, nervous  . Penicillins     Has patient had a PCN reaction causing immediate rash, facial/tongue/throat swelling, SOB or lightheadedness with hypotension: No Has patient had a PCN reaction causing severe rash involving mucus membranes or skin necrosis: No Has patient had a PCN reaction that required hospitalization: No Has  patient had a PCN reaction occurring within the last 10 years: No If all of the above answers are "NO", then may proceed with Cephalosporin use.     Current Meds  Medication Sig  . diclofenac sodium (VOLTAREN) 1 % GEL Apply 2-4 grams to affected joint up to 4 times a day as needed  . estrogens, conjugated, (PREMARIN) 0.3 MG tablet Take 0.3 mg by mouth daily. Take daily for 21 days then do not take for 7 days.  Marland Kitchen linaclotide (LINZESS) 290 MCG CAPS capsule Take 290 mcg by mouth daily before breakfast.  . Menthol, Topical Analgesic, (BIOFREEZE EX) Apply 1 application topically daily as needed (joint pain).   . methocarbamol (ROBAXIN) 500 MG tablet TAKE 1 TABLET MOUTH 2 (TWO) TIMES DAILY AS NEEDED.  . naproxen sodium (ALEVE) 220 MG tablet Take 220 mg by mouth as needed.  Marland Kitchen oxyCODONE-acetaminophen (PERCOCET) 10-325 MG tablet Take 1 tablet by mouth 3 (three) times daily as needed for severe pain.  . pantoprazole (PROTONIX) 40 MG tablet TAKE 1 TABLET BY MOUTH EVERY DAY  . pregabalin (LYRICA) 150 MG capsule TAKE 1 CAPSULE (150 MG TOTAL) BY MOUTH 3 (THREE) TIMES DAILY.   Past Medical History:  Diagnosis Date  . Allergic rhinitis   . ANA positive   . Anemia    Iron deficiency  . Anxiety   . Cervicalgia    Chronic  . Complication of anesthesia    hard to go to sleep with Anesthesia-had to have Benadryl with Colonoscopy  .  DDD (degenerative disc disease)    Cervical  . DJD (degenerative joint disease)   . Fibromyalgia   . GERD (gastroesophageal reflux disease)   . Hyperlipidemia   . Multinodular thyroid   . Neuropathy, peripheral   . Pneumonia   . Vitamin D deficiency 03/18/2011   Past Surgical History:  Procedure Laterality Date  . ABDOMINAL HYSTERECTOMY    . ABDOMINAL HYSTERECTOMY     partial  . BUNIONECTOMY  2011   left foot  . DILATION AND CURETTAGE OF UTERUS    . KNEE ARTHROSCOPY  2004   right  . MYOMECTOMY  2002   prior to hysterectomy  . PARTIAL KNEE ARTHROPLASTY Right  10/07/2014   Procedure: RIGHT UNI KNEE ARTHROPLASTY LATERALLY ;  Surgeon: Paralee Cancel, MD;  Location: [facility];  Service: Orthopedics;  Laterality: Right;  . TONSILLECTOMY     age 43  . TOTAL SHOULDER ARTHROPLASTY Left 04/28/2017   Social History   Social History Narrative   Married 2 kids   Receptionist at [employer]   family history includes Colon cancer in an other family member; Diabetes in her mother; Healthy in her daughter and son; Heart disease in her father; Lung cancer in an other family member; Sickle cell trait in an other family member.   Review of Systems As above  Objective:   Physical Exam BP 122/66   Pulse 74   Temp 97.6 F (36.4 C) (Temporal)   Ht 5' 3.5" (1.613 m)   Wt 188 lb (85.3 kg)   BMI 32.78 kg/m  NAD black woman Eyes anicteric  Patti Martinique, CMA present.  Abdomen soft, focal RLQ/groing tenderness w/o mass or hernia present    Anoderm inspection revealed no abnormalities Anal wink was + Digital exam revealed somewhat decreased resting tone and voluntary squeeze - more gluteal vs anal contraction No mass or rectocele present. Simulated defecation with valsalva revealed appropriate abdominal contraction and descent.

## 2018-07-19 NOTE — Telephone Encounter (Signed)
This is not normally a refillable medication  Pt would need OV to determine the need for an antibiotic, which has risk of side effects if taken too much

## 2018-07-19 NOTE — Telephone Encounter (Signed)
Dr.John please advise. I do not see this on the pt's current med list or that it was cent in by you recently.

## 2018-07-19 NOTE — Patient Instructions (Signed)
We have sent the following medications to your pharmacy for you to pick up at your convenience: Linzess, go on google and look up a savings card and we will give you samples today.   You have been scheduled for a colonoscopy. Please follow written instructions given to you at your visit today.  Please pick up your prep supplies at the pharmacy within the next 1-3 days. If you use inhalers (even only as needed), please bring them with you on the day of your procedure.   I appreciate the opportunity to care for you. Silvano Rusk, MD, [facility]

## 2018-07-20 NOTE — Telephone Encounter (Signed)
Noted. I have spoke to the pharmacist.

## 2018-07-26 ENCOUNTER — Encounter: Payer: 59 | Admitting: Internal Medicine

## 2018-08-03 ENCOUNTER — Ambulatory Visit (INDEPENDENT_AMBULATORY_CARE_PROVIDER_SITE_OTHER): Payer: 59 | Admitting: Internal Medicine

## 2018-08-03 ENCOUNTER — Encounter: Payer: Self-pay | Admitting: Internal Medicine

## 2018-08-03 DIAGNOSIS — J309 Allergic rhinitis, unspecified: Secondary | ICD-10-CM | POA: Diagnosis not present

## 2018-08-03 DIAGNOSIS — J069 Acute upper respiratory infection, unspecified: Secondary | ICD-10-CM

## 2018-08-03 DIAGNOSIS — R739 Hyperglycemia, unspecified: Secondary | ICD-10-CM | POA: Diagnosis not present

## 2018-08-03 MED ORDER — HYDROCOD POLST-CPM POLST ER 10-8 MG/5ML PO SUER: 5.0000 mL | Freq: Two times a day (BID) | ORAL | 0 refills | Status: DC | PRN

## 2018-08-03 MED ORDER — AZITHROMYCIN 250 MG PO TABS: ORAL_TABLET | ORAL | 1 refills | Status: DC

## 2018-08-03 NOTE — Assessment & Plan Note (Signed)
Mild, declines predpac, cont curent tx,  to f/u any worsening symptoms or concerns

## 2018-08-03 NOTE — Assessment & Plan Note (Signed)
Mild to mod, for antibx course,  to f/u any worsening symptoms or concerns 

## 2018-08-03 NOTE — Progress Notes (Signed)
Patient ID: Gabrielle Benson, female   DOB: Jul 09, 1955, 63 y.o.   MRN: [id number]  Virtual Visit via Video Note  I connected with [name] on 08/03/18 at  4:20 PM EDT by a video enabled telemedicine application and verified that I am speaking with the correct person using two identifiers.  Location: Patient: at home Provider: at office   I discussed the limitations of evaluation and management by telemedicine and the availability of in person appointments. The patient expressed understanding and agreed to proceed.  History of Present Illness: Here with 2-3 days acute onset fever, facial pain, pressure, headache, general weakness and malaise, and greenish d/c, with mild ST and cough, but pt denies chest pain, wheezing, increased sob or doe, orthopnea, PND, increased LE swelling, palpitations, dizziness or syncope.  Does have several wks ongoing nasal allergy symptoms with clearish congestion, itch and sneezing, without fever, pain, ST, cough, swelling or wheezing.   Pt denies polydipsia, polyuria Past Medical History:  Diagnosis Date  . Allergic rhinitis   . ANA positive   . Anemia    Iron deficiency  . Anxiety   . Cervicalgia    Chronic  . Complication of anesthesia    hard to go to sleep with Anesthesia-had to have Benadryl with Colonoscopy  . DDD (degenerative disc disease)    Cervical  . DJD (degenerative joint disease)   . Fibromyalgia   . GERD (gastroesophageal reflux disease)   . Hyperlipidemia   . Multinodular thyroid   . Neuropathy, peripheral   . Pneumonia   . Vitamin D deficiency 03/18/2011   Past Surgical History:  Procedure Laterality Date  . ABDOMINAL HYSTERECTOMY    . ABDOMINAL HYSTERECTOMY     partial  . BUNIONECTOMY  2011   left foot  . DILATION AND CURETTAGE OF UTERUS    . KNEE ARTHROSCOPY  2004   right  . MYOMECTOMY  2002   prior to hysterectomy  . PARTIAL KNEE ARTHROPLASTY Right 10/07/2014   Procedure: RIGHT UNI KNEE ARTHROPLASTY  LATERALLY ;  Surgeon: Paralee Cancel, MD;  Location: [facility];  Service: Orthopedics;  Laterality: Right;  . TONSILLECTOMY     age 53  . TOTAL SHOULDER ARTHROPLASTY Left 04/28/2017    reports that she has never smoked. She has never used smokeless tobacco. She reports current alcohol use of about 3.0 - 4.0 standard drinks of alcohol per week. She reports that she does not use drugs. family history includes Colon cancer in an other family member; Diabetes in her mother; Healthy in her daughter and son; Heart disease in her father; Lung cancer in an other family member; Sickle cell trait in an other family member. Allergies  Allergen Reactions  . Ampicillin   . Duloxetine     REACTION: dizzy, nervous  . Penicillins     Has patient had a PCN reaction causing immediate rash, facial/tongue/throat swelling, SOB or lightheadedness with hypotension: No Has patient had a PCN reaction causing severe rash involving mucus membranes or skin necrosis: No Has patient had a PCN reaction that required hospitalization: No Has patient had a PCN reaction occurring within the last 10 years: No If all of the above answers are "NO", then may proceed with Cephalosporin use.     Current Outpatient Medications on File Prior to Visit  Medication Sig Dispense Refill  . diclofenac sodium (VOLTAREN) 1 % GEL Apply 2-4 grams to affected joint up to 4 times a day as needed 4 Tube 2  . estrogens, conjugated, (  PREMARIN) 0.3 MG tablet Take 0.3 mg by mouth daily. Take daily for 21 days then do not take for 7 days.    Marland Kitchen linaclotide (LINZESS) 290 MCG CAPS capsule Take 290 mcg by mouth daily before breakfast.    . linaclotide (LINZESS) 290 MCG CAPS capsule Take 1 capsule (290 mcg total) by mouth daily before breakfast. 90 capsule 3  . Menthol, Topical Analgesic, (BIOFREEZE EX) Apply 1 application topically daily as needed (joint pain).     . methocarbamol (ROBAXIN) 500 MG tablet TAKE 1 TABLET MOUTH 2 (TWO) TIMES DAILY AS NEEDED.  180 tablet 0  . naproxen sodium (ALEVE) 220 MG tablet Take 220 mg by mouth as needed.    Marland Kitchen oxyCODONE-acetaminophen (PERCOCET) 10-325 MG tablet Take 1 tablet by mouth 3 (three) times daily as needed for severe pain.    . pantoprazole (PROTONIX) 40 MG tablet TAKE 1 TABLET BY MOUTH EVERY DAY 90 tablet 1  . pregabalin (LYRICA) 150 MG capsule TAKE 1 CAPSULE (150 MG TOTAL) BY MOUTH 3 (THREE) TIMES DAILY. 270 capsule 0   No current facility-administered medications on file prior to visit.    Observations/Objective: Alert, NAD, mild ill, appropriate mood and affect, resps normal, cn 2-12 intact, moves all 4s, no visible rash or swelling Lab Results  Component Value Date   WBC 4.4 10/18/2017   HGB 11.5 (L) 10/18/2017   HCT 35.2 (L) 10/18/2017   PLT 344.0 10/18/2017   GLUCOSE 90 10/18/2017   CHOL 218 (H) 10/18/2017   TRIG 65.0 10/18/2017   HDL 78.40 10/18/2017   LDLDIRECT 122.7 07/01/2011   LDLCALC 126 (H) 10/18/2017   ALT 10 10/18/2017   AST 13 10/18/2017   NA 139 10/18/2017   K 4.1 10/18/2017   CL 102 10/18/2017   CREATININE 0.63 10/18/2017   BUN 8 10/18/2017   CO2 32 10/18/2017   TSH 2.15 10/18/2017   INR 0.94 10/01/2014   HGBA1C 5.7 10/18/2017   Assessment and Plan: See notes  Follow Up Instructions: See notes   I discussed the assessment and treatment plan with the patient. The patient was provided an opportunity to ask questions and all were answered. The patient agreed with the plan and demonstrated an understanding of the instructions.   The patient was advised to call back or seek an in-person evaluation if the symptoms worsen or if the condition fails to improve as anticipated.   Cathlean Cower, MD

## 2018-08-03 NOTE — Patient Instructions (Signed)
Please take all new medication as prescribed - the antibiotic, and cough medicine as needed  Please continue all other medications as before, including the allergy meds as per Dr Donneta Romberg  Please have the pharmacy call with any other refills you may need.  Please keep your appointments with your specialists as you may have planned

## 2018-08-03 NOTE — Assessment & Plan Note (Signed)
stable overall by history and exam, recent data reviewed with pt, and pt to continue medical treatment as before,  to f/u any worsening symptoms or concerns  

## 2018-08-03 NOTE — Progress Notes (Deleted)
   Subjective:    Patient ID: Gabrielle Benson, female    DOB: June 25, 1955, 63 y.o.   MRN: [id number]  HPI    Review of Systems     Objective:   Physical Exam        Assessment & Plan:

## 2018-08-17 NOTE — Progress Notes (Signed)
Office Visit Note  Patient: Gabrielle Benson             Date of Birth: [birth year]           MRN: [id number]             PCP: Gabrielle Borg, MD Referring: Gabrielle Borg, MD Visit Date: 08/31/2018 Occupation: @GUAROCC @  Subjective:  Chronic lower back pain   History of Present Illness: Gabrielle Benson is a 63 y.o. female with history of osteoarthritis, fibromyalgia, and DDD.  She continues to take Robaxin 500 mg BID prn and Lyrica 150 1 capsule TID.  She has chronic neck and lower back pain.  She has occasional pain in both knee joints, especially with weather changes.  She applies voltaren gel topically for pain relief.  She continues to have trochanteric bursitis bilaterally. She sits at her job, which exacerbates the bursitis.  She has tried to get up throughout the day to stretch. She states her fibromyalgia pain has been manageable.  She has bilateral lateral epicondylitis.  She denies any joint swelling.  She reports she recently had a DEXA ordered by her gynecologist.  She was started on vitamin D and calcium.    Activities of Daily Living:  Patient reports morning stiffness for 2 hours.   Patient Denies nocturnal pain.  Difficulty dressing/grooming: Denies Difficulty climbing stairs: Reports Difficulty getting out of chair: Denies Difficulty using hands for taps, buttons, cutlery, and/or writing: Denies  Review of Systems  Constitutional: Negative for fatigue.  HENT: Negative for mouth sores, mouth dryness and nose dryness.   Eyes: Negative for pain, visual disturbance and dryness.  Respiratory: Negative for cough, hemoptysis, shortness of breath and difficulty breathing.   Cardiovascular: Negative for chest pain, palpitations, hypertension and swelling in legs/feet.  Gastrointestinal: Positive for constipation (Linzess). Negative for blood in stool and diarrhea.  Endocrine: Negative for increased urination.  Genitourinary: Negative for painful urination.   Musculoskeletal: Positive for myalgias, morning stiffness, muscle tenderness and myalgias. Negative for arthralgias, joint pain, joint swelling and muscle weakness.  Skin: Negative for color change, pallor, rash, hair loss, nodules/bumps, skin tightness, ulcers and sensitivity to sunlight.  Allergic/Immunologic: Negative for susceptible to infections.  Neurological: Negative for dizziness, numbness, headaches and weakness.  Hematological: Negative for swollen glands.  Psychiatric/Behavioral: Negative for depressed mood and sleep disturbance. The patient is not nervous/anxious.     PMFS History:  Patient Active Problem List   Diagnosis Date Noted  . Acute upper respiratory infection 09/30/2017  . Hyperglycemia 10/14/2016  . DJD (degenerative joint disease), cervical 10/05/2016  . DDD (degenerative disc disease), lumbar 10/05/2016  . Primary osteoarthritis of left knee 10/05/2016  . Acute left-sided low back pain 03/25/2016  . Pain, joint, multiple sites 03/25/2016  . History of insomnia 03/25/2016  . Greater trochanteric bursitis of both hips 03/25/2016  . Wheezing 02/18/2016  . Chronic pain syndrome 10/11/2015  . Memory dysfunction 04/01/2015  . Status post right partial knee replacement 10/07/2014  . Cough 11/02/2013  . Herpes labialis 11/02/2013  . GERD (gastroesophageal reflux disease) 11/02/2012  . Anxiety 07/01/2011  . Vitamin D deficiency 03/18/2011  . Preventative health care 03/13/2011  . PERIPHERAL NEUROPATHY 10/03/2007  . Hyperlipidemia 01/05/2007  . ANEMIA-IRON DEFICIENCY 01/05/2007  . INSOMNIA-SLEEP DISORDER-UNSPEC 01/05/2007  . Allergic rhinitis 01/05/2007  . Irritable bowel syndrome with constipation 01/05/2007  . Fibromyalgia syndrome 01/05/2007    Past Medical History:  Diagnosis Date  . Allergic rhinitis   . ANA positive   .  Anemia    Iron deficiency  . Anxiety   . Cervicalgia    Chronic  . Complication of anesthesia    hard to go to sleep with  Anesthesia-had to have Benadryl with Colonoscopy  . DDD (degenerative disc disease)    Cervical  . DJD (degenerative joint disease)   . Fibromyalgia   . GERD (gastroesophageal reflux disease)   . Hyperlipidemia   . Multinodular thyroid   . Neuropathy, peripheral   . Pneumonia   . Vitamin D deficiency 03/18/2011    Family History  Problem Relation Age of Onset  . Heart disease Father   . Sickle cell trait Other   . Lung cancer Other   . Colon cancer Other        Aunt  . Diabetes Mother   . Healthy Daughter   . Healthy Son   . Stomach cancer Neg Hx   . Rectal cancer Neg Hx   . Esophageal cancer Neg Hx    Past Surgical History:  Procedure Laterality Date  . ABDOMINAL HYSTERECTOMY    . ABDOMINAL HYSTERECTOMY     partial  . BUNIONECTOMY  2011   left foot  . DILATION AND CURETTAGE OF UTERUS    . KNEE ARTHROSCOPY  2004   right  . MYOMECTOMY  2002   prior to hysterectomy  . PARTIAL KNEE ARTHROPLASTY Right 10/07/2014   Procedure: RIGHT UNI KNEE ARTHROPLASTY LATERALLY ;  Surgeon: Paralee Cancel, MD;  Location: [facility];  Service: Orthopedics;  Laterality: Right;  . TONSILLECTOMY     age 71  . 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 Influenza Whole 11/22/2006  . Td 01/19/1992, 09/06/2014  . Tdap 07/01/2011     Objective: Vital Signs: BP (!) 164/85 (BP Location: Right Arm, Patient Position: Sitting, Cuff Size: Normal)   Pulse 66   Resp 13   Ht 5' 3.5" (1.613 m)   Wt 188 lb (85.3 kg)   BMI 32.78 kg/m    Physical Exam Vitals signs and nursing note reviewed.  Constitutional:      Appearance: She is well-developed.  HENT:     Head: Normocephalic and atraumatic.  Eyes:     Conjunctiva/sclera: Conjunctivae normal.  Neck:     Musculoskeletal: Normal range of motion.  Cardiovascular:     Rate and Rhythm: Normal rate and  regular rhythm.     Heart sounds: Normal heart sounds.  Pulmonary:     Effort: Pulmonary effort is normal.     Breath sounds: Normal breath sounds.  Abdominal:     General: Bowel sounds are normal.     Palpations: Abdomen is soft.  Lymphadenopathy:     Cervical: No cervical adenopathy.  Skin:    General: Skin is warm and dry.     Capillary Refill: Capillary refill takes less than 2 seconds.  Neurological:     Mental Status: She is alert and oriented to person, place, and time.  Psychiatric:        Behavior: Behavior normal.      Musculoskeletal Exam: C-spine good ROM. Limited ROM and discomfort in lumbar spine. Shoulder joints, elbow joints, wrist joints, MCPs, PIPs, and DIPs good ROM with no synovitis.  Complete fist formation bilaterally.  Hip joints good ROM.  Knee joints have good ROM with no discomfort. Right knee partial replacement is  warm but no effusion noted. Ankle joints have good ROM with no tenderness or swelling. Trochanteric bursitis bilaterally.   CDAI Exam: CDAI Score: - Patient Global: -; Provider Global: - Swollen: -; Tender: - Joint Exam   No joint exam has been documented for this visit   There is currently no information documented on the homunculus. Go to the Rheumatology activity and complete the homunculus joint exam.  Investigation: No additional findings.  Imaging: No results found.  Recent Labs: Lab Results  Component Value Date   WBC 4.4 10/18/2017   HGB 11.5 (L) 10/18/2017   PLT 344.0 10/18/2017   NA 139 10/18/2017   K 4.1 10/18/2017   CL 102 10/18/2017   CO2 32 10/18/2017   GLUCOSE 90 10/18/2017   BUN 8 10/18/2017   CREATININE 0.63 10/18/2017   BILITOT 0.3 10/18/2017   ALKPHOS 88 10/18/2017   AST 13 10/18/2017   ALT 10 10/18/2017   PROT 7.4 10/18/2017   ALBUMIN 4.1 10/18/2017   CALCIUM 10.4 10/18/2017   GFRAA >60 08/28/2016    Speciality Comments: No specialty comments available.  Procedures:  No procedures performed  Allergies: Ampicillin, Duloxetine, and Penicillins   Assessment / Plan:   Visit Diagnoses: Fibromyalgia syndrome - Her fibromyalgia pain has been manageable.  She has very mild muscle aches and tenderness.  Overall her pain has subsided.  She has been trying to lose weight.  She was encouraged to stay active and exercise on a regular basis.  She continues to have trochanter bursitis bilaterally and bilateral lateral epicondylitis.  She was encouraged to perform stretching exercises on a daily basis.  She declined trochanteric bursa cortisone injections today.  She will continue taking Lyrica 150 mg 1 capsule 3 times daily and Robaxin 500 mg twice daily PRN for muscle spasms.  She does not need any refills at this time.  She will follow-up in the office in 6 months.  Other fatigue - Her level of fatigue has improved.  She was encouraged to stay active and exercise on a regular basis.  Primary insomnia - She has been sleeping better at night.  Primary osteoarthritis of left knee - She has good range of motion with no discomfort.  No warmth or effusion noted.  She uses Voltaren gel topically as needed for pain relief.  Status post right partial knee replacement -Doing well.  She has warmth but no effusion on exam.  She has good range of motion with no discomfort.  She experiences occasional discomfort especially with weather changes.  She uses Voltaren gel topically as needed for pain relief.  Greater trochanteric bursitis of both hips - She has tenderness over bilateral trochanteric bursa.  She was encouraged to perform stretching exercises on a daily basis.  We also discussed switching positions frequently.  She is very sedentary at work and we discussed taking breaks to stretch.  She declined cortisone injections today.  DDD (degenerative disc disease), cervical -She has good range of motion on exam.  She has no symptoms of radiculopathy at this time  DDD (degenerative disc disease), lumbar -  Chronic pain.  She has had surgery in the past.  She experiences radiculopathy down the left lower extremity at times.  History of chronic pain - She takes Lyrica 150 mg 1 capsule by mouth TID.   Other medical conditions are listed as follows:   History of neuropathy   History of scoliosis   History of anxiety   History of vitamin D deficiency -She  taking a vitamin D supplement.   Orders: No orders of the defined types were placed in this encounter.  No orders of the defined types were placed in this encounter.     Follow-Up Instructions: Return in about 6 months (around 03/03/2019) for Fibromyalgia, Osteoarthritis, DDD.   Ofilia Neas, PA-C  Note - This record has been created using Dragon software.  Chart creation errors have been sought, but may not always  have been located. Such creation errors do not reflect on  the standard of medical care.

## 2018-08-18 ENCOUNTER — Telehealth: Payer: Self-pay | Admitting: Internal Medicine

## 2018-08-18 NOTE — Telephone Encounter (Signed)
Pt returned call and answered “No” to all questions.  °  °Pt made aware of that care partner may come to the lobby during the procedure but will need to provide their own mask. ° ° °

## 2018-08-18 NOTE — Telephone Encounter (Signed)
Left messge for patient regarding Covid-19 screening questions. Covid-19 Screening Questions:   Do you now or have you had a fever in the last 14 days?    Do you have any respiratory symptoms of shortness of breath or cough now or in the last 14 days?    Do you have any family members or close contacts with diagnosed or suspected Covid-19 in the past 14 days?    Have you been tested for Covid-19 and found to be positive?

## 2018-08-21 ENCOUNTER — Ambulatory Visit (AMBULATORY_SURGERY_CENTER): Payer: 59 | Admitting: Internal Medicine

## 2018-08-21 ENCOUNTER — Other Ambulatory Visit: Payer: Self-pay

## 2018-08-21 ENCOUNTER — Encounter: Payer: Self-pay | Admitting: Internal Medicine

## 2018-08-21 VITALS — BP 143/83 | HR 70 | Temp 98.6°F | Resp 17 | Ht 63.0 in | Wt 188.0 lb

## 2018-08-21 DIAGNOSIS — R10813 Right lower quadrant abdominal tenderness: Secondary | ICD-10-CM

## 2018-08-21 DIAGNOSIS — Z1211 Encounter for screening for malignant neoplasm of colon: Secondary | ICD-10-CM | POA: Diagnosis not present

## 2018-08-21 MED ORDER — SODIUM CHLORIDE 0.9 % IV SOLN: 500.0000 mL | Freq: Once | INTRAVENOUS | Status: DC

## 2018-08-21 NOTE — Op Note (Signed)
[facility] Patient Name: Gabrielle Benson Procedure Date: 08/21/2018 1:23 PM MRN: [id number] Endoscopist: Gatha Mayer , MD Age: 63 Referring MD:  Date of Birth: February 27, 1955 Gender: Female Account #: [id number] Procedure:                Colonoscopy Indications:              Screening for colorectal malignant neoplasm Medicines:                Propofol per Anesthesia, Monitored Anesthesia Care Procedure:                Pre-Anesthesia Assessment:                           - Prior to the procedure, a History and Physical                            was performed, and patient medications and                            allergies were reviewed. The patient's tolerance of                            previous anesthesia was also reviewed. The risks                            and benefits of the procedure and the sedation                            options and risks were discussed with the patient.                            All questions were answered, and informed consent                            was obtained. Prior Anticoagulants: The patient has                            taken no previous anticoagulant or antiplatelet                            agents. ASA Grade Assessment: II - A patient with                            mild systemic disease. After reviewing the risks                            and benefits, the patient was deemed in                            satisfactory condition to undergo the procedure.                           After obtaining informed consent, the colonoscope  was passed under direct vision. Throughout the                            procedure, the patient's blood pressure, pulse, and                            oxygen saturations were monitored continuously. The                            Colonoscope was introduced through the anus and                            advanced to the the terminal ileum, with     identification of the appendiceal orifice and IC                            valve. The colonoscopy was performed without                            difficulty. The patient tolerated the procedure                            well. The quality of the bowel preparation was                            excellent. The bowel preparation used was SUPREP                            via split dose instruction. The terminal ileum, the                            appendiceal orifice and the rectum were                            photographed. Scope In: 1:35:46 PM Scope Out: 1:47:34 PM Scope Withdrawal Time: 0 hours 8 minutes 9 seconds  Total Procedure Duration: 0 hours 11 minutes 48 seconds  Findings:                 The perianal and digital rectal examinations were                            normal.                           The terminal ileum appeared normal.                           The entire examined colon appeared normal on direct                            and retroflexion views. Complications:            No immediate complications. Estimated Blood Loss:     Estimated blood loss: none. Impression:               -  The examined portion of the ileum was normal.                           - The entire examined colon is normal on direct and                            retroflexion views. Small lipoma ascending colon -                            incidental finding                           - No specimens collected. Recommendation:           - Patient has a contact number available for                            emergencies. The signs and symptoms of potential                            delayed complications were discussed with the                            patient. Return to normal activities tomorrow.                            Written discharge instructions were provided to the                            patient.                           - Resume previous diet.                           -  Continue present medications.                           - Repeat colonoscopy in 10 years for screening                            purposes.                           - MY OFFICE WILL ARRANGE FOR PELVIC PT REFERRAL -                            TO HELP URINARY INCONTINENCE AND CONSTIPATION Gatha Mayer, MD 08/21/2018 1:55:08 PM This report has been signed electronically.

## 2018-08-21 NOTE — Patient Instructions (Addendum)
No abnormalities seen. No polyps, cancer or inflammation.  This is good news.  My office will refer you to the physical therapists that do pelvic floor PT - it will help with defecation and urination problems.  Next routine colonoscopy or other screening test in 10 years - 2030  I appreciate the opportunity to care for you. Gatha Mayer, MD, [facility]  Resume previous diet. Continue present medications.  YOU HAD AN ENDOSCOPIC PROCEDURE TODAY AT [facility]:   Refer to the procedure report that was given to you for any specific questions about what was found during the examination.  If the procedure report does not answer your questions, please call your gastroenterologist to clarify.  If you requested that your care partner not be given the details of your procedure findings, then the procedure report has been included in a sealed envelope for you to review at your convenience later.  YOU SHOULD EXPECT: Some feelings of bloating in the abdomen. Passage of more gas than usual.  Walking can help get rid of the air that was put into your GI tract during the procedure and reduce the bloating. If you had a lower endoscopy (such as a colonoscopy or flexible sigmoidoscopy) you may notice spotting of blood in your stool or on the toilet paper. If you underwent a bowel prep for your procedure, you may not have a normal bowel movement for a few days.  Please Note:  You might notice some irritation and congestion in your nose or some drainage.  This is from the oxygen used during your procedure.  There is no need for concern and it should clear up in a day or so.  SYMPTOMS TO REPORT IMMEDIATELY:   Following lower endoscopy (colonoscopy or flexible sigmoidoscopy):  Excessive amounts of blood in the stool  Significant tenderness or worsening of abdominal pains  Swelling of the abdomen that is new, acute  Fever of 100F or higher For urgent or emergent issues, a  gastroenterologist can be reached at any hour by calling [phone].   DIET:  We do recommend a small meal at first, but then you may proceed to your regular diet.  Drink plenty of fluids but you should avoid alcoholic beverages for 24 hours.  ACTIVITY:  You should plan to take it easy for the rest of today and you should NOT DRIVE or use heavy machinery until tomorrow (because of the sedation medicines used during the test).    FOLLOW UP: Our staff will call the number listed on your records 48-72 hours following your procedure to check on you and address any questions or concerns that you may have regarding the information given to you following your procedure. If we do not reach you, we will leave a message.  We will attempt to reach you two times.  During this call, we will ask if you have developed any symptoms of COVID 19. If you develop any symptoms (ie: fever, flu-like symptoms, shortness of breath, cough etc.) before then, please call [phone].  If you test positive for Covid 19 in the 2 weeks post procedure, please call and report this information to [place].    If any biopsies were taken you will be contacted by phone or by letter within the next 1-3 weeks.  Please call us at [phone] if you have not heard about the biopsies in 3 weeks.    SIGNATURES/CONFIDENTIALITY: You and/or your care partner have signed paperwork which will be entered  into your electronic medical record.  These signatures attest to the fact that that the information above on your After Visit Summary has been reviewed and is understood.  Full responsibility of the confidentiality of this discharge information lies with you and/or your care-partner.

## 2018-08-21 NOTE — Progress Notes (Signed)
Report to PACU, RN, vss, BBS= Clear.  

## 2018-08-21 NOTE — Progress Notes (Signed)
Pt's states no medical or surgical changes since previsit or office visit.  JB temps and Rufus vitals. Sm

## 2018-08-22 ENCOUNTER — Telehealth: Payer: Self-pay

## 2018-08-22 DIAGNOSIS — K5900 Constipation, unspecified: Secondary | ICD-10-CM

## 2018-08-22 DIAGNOSIS — N393 Stress incontinence (female) (male): Secondary | ICD-10-CM

## 2018-08-22 NOTE — Telephone Encounter (Signed)
Patient notified of the referral to pelvic PT for urinary incontinence and constipation.  She is aware that she will be contacted by them directly to arrange an appt.

## 2018-08-23 ENCOUNTER — Telehealth: Payer: Self-pay

## 2018-08-23 NOTE — Telephone Encounter (Signed)
  Follow up Call-  Call back number 08/21/2018  Post procedure Call Back phone  # [phone]  Permission to leave phone message Yes  Some recent data might be hidden     Patient questions:  Do you have a fever, pain , or abdominal swelling? No. Pain Score  0 *  Have you tolerated food without any problems? Yes.    Have you been able to return to your normal activities? Yes.    Do you have any questions about your discharge instructions: Diet   No. Medications  No. Follow up visit  No.  Do you have questions or concerns about your Care? No.  Actions: * If pain score is 4 or above: 1. No action needed, pain <4.Have you developed a fever since your procedure? no  2.   Have you had an respiratory symptoms (SOB or cough) since your procedure? no  3.   Have you tested positive for COVID 19 since your procedure no  4.   Have you had any family members/close contacts diagnosed with the COVID 19 since your procedure?  no   If yes to any of these questions please route to Joylene John, RN and Alphonsa Gin, Therapist, sports.

## 2018-08-29 ENCOUNTER — Ambulatory Visit: Payer: 59 | Admitting: Physical Therapy

## 2018-08-31 ENCOUNTER — Encounter: Payer: Self-pay | Admitting: Physician Assistant

## 2018-08-31 ENCOUNTER — Other Ambulatory Visit: Payer: Self-pay

## 2018-08-31 ENCOUNTER — Ambulatory Visit: Payer: 59 | Admitting: Physician Assistant

## 2018-08-31 VITALS — BP 164/85 | HR 66 | Resp 13 | Ht 63.5 in | Wt 188.0 lb

## 2018-08-31 DIAGNOSIS — M7062 Trochanteric bursitis, left hip: Secondary | ICD-10-CM

## 2018-08-31 DIAGNOSIS — M5136 Other intervertebral disc degeneration, lumbar region: Secondary | ICD-10-CM

## 2018-08-31 DIAGNOSIS — R5383 Other fatigue: Secondary | ICD-10-CM

## 2018-08-31 DIAGNOSIS — M51369 Other intervertebral disc degeneration, lumbar region without mention of lumbar back pain or lower extremity pain: Secondary | ICD-10-CM

## 2018-08-31 DIAGNOSIS — Z8639 Personal history of other endocrine, nutritional and metabolic disease: Secondary | ICD-10-CM

## 2018-08-31 DIAGNOSIS — M1712 Unilateral primary osteoarthritis, left knee: Secondary | ICD-10-CM

## 2018-08-31 DIAGNOSIS — Z8659 Personal history of other mental and behavioral disorders: Secondary | ICD-10-CM

## 2018-08-31 DIAGNOSIS — Z8739 Personal history of other diseases of the musculoskeletal system and connective tissue: Secondary | ICD-10-CM

## 2018-08-31 DIAGNOSIS — M797 Fibromyalgia: Secondary | ICD-10-CM

## 2018-08-31 DIAGNOSIS — Z8669 Personal history of other diseases of the nervous system and sense organs: Secondary | ICD-10-CM

## 2018-08-31 DIAGNOSIS — M7061 Trochanteric bursitis, right hip: Secondary | ICD-10-CM

## 2018-08-31 DIAGNOSIS — Z96651 Presence of right artificial knee joint: Secondary | ICD-10-CM

## 2018-08-31 DIAGNOSIS — F5101 Primary insomnia: Secondary | ICD-10-CM

## 2018-08-31 DIAGNOSIS — M5030 Other cervical disc degeneration, unspecified cervical region: Secondary | ICD-10-CM

## 2018-08-31 DIAGNOSIS — Z87898 Personal history of other specified conditions: Secondary | ICD-10-CM

## 2018-09-12 ENCOUNTER — Ambulatory Visit: Payer: 59 | Attending: Internal Medicine | Admitting: Physical Therapy

## 2018-09-12 ENCOUNTER — Other Ambulatory Visit: Payer: Self-pay

## 2018-09-12 ENCOUNTER — Encounter: Payer: Self-pay | Admitting: Physical Therapy

## 2018-09-12 DIAGNOSIS — R278 Other lack of coordination: Secondary | ICD-10-CM | POA: Diagnosis present

## 2018-09-12 DIAGNOSIS — K5900 Constipation, unspecified: Secondary | ICD-10-CM | POA: Diagnosis present

## 2018-09-12 DIAGNOSIS — N393 Stress incontinence (female) (male): Secondary | ICD-10-CM | POA: Diagnosis present

## 2018-09-12 DIAGNOSIS — M6281 Muscle weakness (generalized): Secondary | ICD-10-CM | POA: Insufficient documentation

## 2018-09-12 NOTE — Patient Instructions (Addendum)
Adduction: Hip - Knees Together With Pelvic Floor (Side-Lying)    Lie on left side, hips and knees slightly bent, towel roll between knees. Squeeze pelvic floor while pushing knees together. Hold for _5_ seconds. Rest for _5__ seconds. Repeat __10_ times. Do _3__ times a day.   Copyright  VHI. All rights reserved.   Lubrication . Used for intercourse to reduce friction . Avoid ones that have glycerin, warming gels, tingling gels, icing or cooling gel, scented . Avoid parabens due to a preservative similar to female sex hormone . May need to be reapplied once or several times during sexual activity . Can be applied to both partners genitals prior to vaginal penetration to minimize friction or irritation . Prevent irritation and mucosal tears that cause post coital pain and increased the risk of vaginal and urinary tract infections . Oil-based lubricants cannot be used with condoms due to breaking them down.  Least likely to irritate vaginal tissue.  . Plant based-lubes are safe . Silicone-based lubrication are thicker and last long and used for post-menopausal women  Vaginal Lubricators Here is a list of some suggested lubricators you can use for intercourse. Use the most hypoallergenic product.  You can place on you or your partner.   Slippery Stuff  Sylk or Sliquid Natural H2O ( good  if frequent UTI's)  Blossom Organics (www.blossom-organics.com)  Luvena   Coconut oil  PJur Woman Nude- water based lubricant, amazon  Uberlube- Amazon  Aloe Vera  Yes lubricant- [employer] Platinum-Silicone, [employer], [facility]  Olive and Bee intimate cream-  www.oliveandbee.com.au  Pink - Amazon Things to avoid in lubricants are glycerin, warming gels, tingling gels, icing or cooling  gels, and scented gels.  Also avoid Vaseline. KY jelly, Replens, and Astroglide kills good bacteria(lactobacilli)  Things to avoid in the vaginal area . Do not use things to irritate the vulvar area .  No lotions- see below . No soaps; can use Aveeno or Calendula cleanser if needed. Must be gentle . No deodorants . No douches . Good to sleep without underwear to let the vaginal area to air out . No scrubbing: spread the lips to let warm water rinse over labias and pat dry  Creams that can be used on the [place] Releveum or Baker oil  [facility] [address] [place], [place] [address] Phone # [phone] Fax [phone]

## 2018-09-12 NOTE — Therapy (Signed)
[facility] [address], [place], [place], [address] Phone: [phone]   Fax:  [phone]  Physical Therapy Evaluation  Patient Details  Name: Gabrielle Benson MRN: [id number] Date of Birth: 1955-09-12 Referring Provider (PT): Dr. Silvano Rusk   Encounter Date: 09/12/2018  PT End of Session - 09/12/18 0953    Visit Number  1    Date for PT Re-Evaluation  12/05/18    Authorization Type  [employer]    PT Start Time  0845    PT Stop Time  0935    PT Time Calculation (min)  50 min    Activity Tolerance  Patient tolerated treatment well;No increased pain    Behavior During Therapy  WFL for tasks assessed/performed       Past Medical History:  Diagnosis Date  . Allergic rhinitis   . ANA positive   . Anemia    Iron deficiency  . Anxiety   . Cervicalgia    Chronic  . Complication of anesthesia    hard to go to sleep with Anesthesia-had to have Benadryl with Colonoscopy  . DDD (degenerative disc disease)    Cervical  . DJD (degenerative joint disease)   . Fibromyalgia   . GERD (gastroesophageal reflux disease)   . Hyperlipidemia   . Multinodular thyroid   . Neuropathy, peripheral   . Pneumonia   . Vitamin D deficiency 03/18/2011    Past Surgical History:  Procedure Laterality Date  . ABDOMINAL HYSTERECTOMY    . ABDOMINAL HYSTERECTOMY     partial  . BUNIONECTOMY  2011   left foot  . DILATION AND CURETTAGE OF UTERUS    . KNEE ARTHROSCOPY  2004   right  . MYOMECTOMY  2002   prior to hysterectomy  . PARTIAL KNEE ARTHROPLASTY Right 10/07/2014   Procedure: RIGHT UNI KNEE ARTHROPLASTY LATERALLY ;  Surgeon: Paralee Cancel, MD;  Location: [facility];  Service: Orthopedics;  Laterality: Right;  . TONSILLECTOMY     age 63  . TOTAL SHOULDER ARTHROPLASTY Left 04/28/2017    There were no vitals filed for this visit.   Subjective Assessment - 09/12/18 0853    Subjective  Constipation all her life. She eats lots of  vegetables and balanced well. She can get cleaned out and then gets stopped up. Urinary leakage wiht cough and sneeze. Last 10 years has occasionally leakage.    Patient Stated Goals  learn exercises to control the urine and pelvic floor    Currently in Pain?  Yes    Pain Score  8     Pain Location  Abdomen    Pain Orientation  Left;Right;Lateral    Pain Descriptors / Indicators  Pressure    Pain Type  Chronic pain    Pain Onset  More than a month ago    Pain Frequency  Intermittent    Aggravating Factors   when no BM    Pain Relieving Factors  BM    Multiple Pain Sites  Yes    Pain Score  9    Pain Location  Back    Pain Orientation  Upper;Lower    Pain Descriptors / Indicators  Dull;Sharp;Shooting    Pain Type  Chronic pain    Pain Onset  More than a month ago    Pain Frequency  Constant    Aggravating Factors   exercise;    Pain Relieving Factors  no exercise         [facility] PT  Assessment - 09/12/18 0001      Assessment   Medical Diagnosis  N393.3 Urinary incontinence, stress female; K59.00 Constipation, unspecified constipation, unspecified constipation type    Referring Provider (PT)  Dr. Silvano Rusk    Onset Date/Surgical Date  --   10 years   Prior Therapy  none      Precautions   Precautions  None      Restrictions   Weight Bearing Restrictions  No      Balance Screen   Has the patient fallen in the past 6 months  No    Has the patient had a decrease in activity level because of a fear of falling?   No    Is the patient reluctant to leave their home because of a fear of falling?   No      Prior Function   Level of Independence  Independent    Vocation  Full time employment    Vocation Requirements  sitting      Cognition   Overall Cognitive Status  Within Functional Limits for tasks assessed      Posture/Postural Control   Posture/Postural Control  Postural limitations    Posture Comments  scoliosis; right leg is shorter than left      ROM / Strength    AROM / PROM / Strength  AROM;PROM;Strength      Strength   Right Hip ABduction  3/5    Right Hip ADduction  3+/5    Left Hip ABduction  3+/5      Palpation   Palpation comment  tenderness located on the left lower abdominal, decreased movement of the diaphgram, decreased lower rib cage movement,                 Objective measurements completed on examination: See above findings.    Pelvic Floor Special Questions - 09/12/18 0001    Currently Sexually Active  Yes    Is this Painful  Yes   pain on left with intercourse   Marinoff Scale  discomfort that does not affect completion    Urinary Leakage  Yes    Pad use  1 pad per day, light day    Activities that cause leaking  Coughing;Sneezing    Urinary urgency  Yes    Fecal incontinence  No   does not fully evacuate the stool   Skin Integrity  Intact   dryness   Pelvic Floor Internal Exam  Patient comfirms identification and approves PT to assess pelvic floor and treatment    Exam Type  Vaginal;Rectal    Palpation  vaginal contraction is more anterior and posterior; rectal most contraction is posterior    Strength  Flicker   rectal and vaginal              PT Education - 09/12/18 0950    Education Details  pelvic floor contraction in sidely; vaginal lubricants, vaginal health    Person(s) Educated  Patient    Methods  Explanation;Demonstration;Handout    Comprehension  Returned demonstration;Verbalized understanding       PT Short Term Goals - 09/12/18 1004      PT SHORT TERM GOAL #1   Title  independent with intial HEP    Time  4    Period  Weeks    Status  New    Target Date  10/10/18      PT SHORT TERM GOAL #2   Title  understand correct toileting technique to relax  the pelvic floor with correct breathing    Time  4    Period  Weeks    Status  New    Target Date  10/10/18      PT SHORT TERM GOAL #3   Title  pelvic floor strength is 2/5 so she is able to contract with a circular  contraction    Time  4    Period  Weeks    Status  New    Target Date  10/10/18      PT SHORT TERM GOAL #4   Title  understand about bowel health    Time  4    Period  Weeks    Status  New    Target Date  10/10/18        PT Long Term Goals - 09/12/18 1006      PT LONG TERM GOAL #1   Title  independent with HEP and understand how to progress herself    Time  12    Period  Weeks    Status  New    Target Date  12/05/18      PT LONG TERM GOAL #2   Title  able to have a full bowel movement that is Type 3-5 due to improve mechanics    Time  12    Period  Weeks    Status  New    Target Date  12/05/18      PT LONG TERM GOAL #3   Title  pelvic floor strength >/= 3/5 so she does not have to wear a pad and no leakage with coughing and sneezing    Time  12    Period  Weeks    Status  New    Target Date  12/05/18      PT LONG TERM GOAL #4   Title  pressure feeling in the abdomen improves >/= 50% due to having regular bowel movements    Time  12    Period  Weeks    Status  New    Target Date  12/05/18             Plan - 09/12/18 0954    Clinical Impression Statement  Patient is a 63 year old with chronic constipation and urinary leakage. Patient will go to the bathroom regularly when she takes the [place] with Type 3-5. When patient does not take the [place] she will have a bowel movement every 3-4 days with Type 1 bowel movement. Patient has increased pressure feeling in her lower abdominal, bladder  and hips when she has not had a bowel movement. Patient does not always feel like she fully evacuates her bowels. Patient has pain on the left pelvic floor with intercourse. Pelvic floor strength rectally and vaginally is 1/5. Anal contraction is mostly posteriorly and will substitute with her gluteals. Vaginal contraction is more anterior and posterior. Patient will wear 1 pad per day due to leakage with coughing and sneezing. Patient has to lean forward to urinate. Patient  bilateral hip abduction is 3+/5. Patient reports chronic back pain at level 9/10. Patient will benefit from skilled therapy to improve pelvic floor coordination to have a bowel movement and reduce uriary leakage.    Personal Factors and Comorbidities  Age;Fitness;Comorbidity 1;Comorbidity 2;Sex;Time since onset of injury/illness/exacerbation    Comorbidities  Fibromyalgia; chronic neck and back pain    Examination-Activity Limitations  Continence;Toileting    Stability/Clinical Decision Making  Evolving/Moderate complexity    Clinical Decision  Making  Moderate    Rehab Potential  Good    PT Frequency  1x / week    PT Duration  12 weeks    PT Treatment/Interventions  Biofeedback;Cryotherapy;Electrical Stimulation;Moist Heat;Therapeutic exercise;Therapeutic activities;Neuromuscular re-education;Patient/family education;Dry needling;Manual techniques    PT Next Visit Plan  hip stretches, bladder irritants, toileting technique, diaphragmatic breathing, tactile cues to contract the pelvic floor    Consulted and Agree with Plan of Care  Patient       Patient will benefit from skilled therapeutic intervention in order to improve the following deficits and impairments:  Decreased coordination, Increased muscle spasms, Pain, Decreased strength  Visit Diagnosis: Muscle weakness (generalized) - Plan: PT plan of care cert/re-cert  Other lack of coordination - Plan: PT plan of care cert/re-cert  Stress incontinence (female) (female) - Plan: PT plan of care cert/re-cert  Constipation, unspecified constipation type - Plan: PT plan of care cert/re-cert     Problem List Patient Active Problem List   Diagnosis Date Noted  . Acute upper respiratory infection 09/30/2017  . Hyperglycemia 10/14/2016  . DJD (degenerative joint disease), cervical 10/05/2016  . DDD (degenerative disc disease), lumbar 10/05/2016  . Primary osteoarthritis of left knee 10/05/2016  . Acute left-sided low back pain 03/25/2016   . Pain, joint, multiple sites 03/25/2016  . History of insomnia 03/25/2016  . Greater trochanteric bursitis of both hips 03/25/2016  . Wheezing 02/18/2016  . Chronic pain syndrome 10/11/2015  . Memory dysfunction 04/01/2015  . Status post right partial knee replacement 10/07/2014  . Cough 11/02/2013  . Herpes labialis 11/02/2013  . GERD (gastroesophageal reflux disease) 11/02/2012  . Anxiety 07/01/2011  . Vitamin D deficiency 03/18/2011  . Preventative health care 03/13/2011  . PERIPHERAL NEUROPATHY 10/03/2007  . Hyperlipidemia 01/05/2007  . ANEMIA-IRON DEFICIENCY 01/05/2007  . INSOMNIA-SLEEP DISORDER-UNSPEC 01/05/2007  . Allergic rhinitis 01/05/2007  . Irritable bowel syndrome with constipation 01/05/2007  . Fibromyalgia syndrome 01/05/2007    Earlie Counts, PT 09/12/18 10:10 AM   [facility] [address] [place], [place], [address] Phone: [phone]   Fax:  [phone]  Name: Gabrielle Benson MRN: [id number] Date of Birth: 05/11/1955

## 2018-09-19 ENCOUNTER — Encounter: Payer: Self-pay | Admitting: Physical Therapy

## 2018-09-19 ENCOUNTER — Other Ambulatory Visit: Payer: Self-pay | Admitting: Rheumatology

## 2018-09-19 ENCOUNTER — Other Ambulatory Visit: Payer: Self-pay

## 2018-09-19 ENCOUNTER — Other Ambulatory Visit: Payer: Self-pay | Admitting: Physician Assistant

## 2018-09-19 ENCOUNTER — Ambulatory Visit: Payer: 59 | Attending: Internal Medicine | Admitting: Physical Therapy

## 2018-09-19 DIAGNOSIS — M6281 Muscle weakness (generalized): Secondary | ICD-10-CM | POA: Insufficient documentation

## 2018-09-19 DIAGNOSIS — N393 Stress incontinence (female) (male): Secondary | ICD-10-CM | POA: Diagnosis present

## 2018-09-19 DIAGNOSIS — K5900 Constipation, unspecified: Secondary | ICD-10-CM | POA: Insufficient documentation

## 2018-09-19 DIAGNOSIS — R278 Other lack of coordination: Secondary | ICD-10-CM | POA: Diagnosis present

## 2018-09-19 NOTE — Telephone Encounter (Signed)
Last Visit: 08/31/18 Next Visit: 03/01/19  Okay to refill Lyrica?

## 2018-09-19 NOTE — Therapy (Signed)
[facility] [address] [place], [place], [address] Phone: [phone]   Fax:  [phone]  Physical Therapy Treatment  Patient Details  Name: Gabrielle Benson MRN: [id number] Date of Birth: 10/28/1955 Referring Provider (PT): Dr. Silvano Rusk   Encounter Date: 09/19/2018  PT End of Session - 09/19/18 1240    Visit Number  2    Date for PT Re-Evaluation  12/05/18    Authorization Type  [employer]    PT Start Time  1230    PT Stop Time  1308    PT Time Calculation (min)  38 min    Activity Tolerance  Patient tolerated treatment well;No increased pain    Behavior During Therapy  WFL for tasks assessed/performed       Past Medical History:  Diagnosis Date  . Allergic rhinitis   . ANA positive   . Anemia    Iron deficiency  . Anxiety   . Cervicalgia    Chronic  . Complication of anesthesia    hard to go to sleep with Anesthesia-had to have Benadryl with Colonoscopy  . DDD (degenerative disc disease)    Cervical  . DJD (degenerative joint disease)   . Fibromyalgia   . GERD (gastroesophageal reflux disease)   . Hyperlipidemia   . Multinodular thyroid   . Neuropathy, peripheral   . Pneumonia   . Vitamin D deficiency 03/18/2011    Past Surgical History:  Procedure Laterality Date  . ABDOMINAL HYSTERECTOMY    . ABDOMINAL HYSTERECTOMY     partial  . BUNIONECTOMY  2011   left foot  . DILATION AND CURETTAGE OF UTERUS    . KNEE ARTHROSCOPY  2004   right  . MYOMECTOMY  2002   prior to hysterectomy  . PARTIAL KNEE ARTHROPLASTY Right 10/07/2014   Procedure: RIGHT UNI KNEE ARTHROPLASTY LATERALLY ;  Surgeon: Paralee Cancel, MD;  Location: [facility];  Service: Orthopedics;  Laterality: Right;  . TONSILLECTOMY     age 63  . TOTAL SHOULDER ARTHROPLASTY Left 04/28/2017    There were no vitals filed for this visit.  Subjective Assessment - 09/19/18 1236    Subjective  NO changes since the last visit. I have been doing  my exercises.    Patient Stated Goals  learn exercises to control the urine and pelvic floor    Currently in Pain?  Yes    Pain Score  4     Pain Location  Abdomen    Pain Orientation  Left;Right;Lateral    Pain Descriptors / Indicators  Pressure    Pain Type  Chronic pain    Pain Onset  More than a month ago    Aggravating Factors   when no BM and when pressing on the abdomen    Pain Relieving Factors  BM    Multiple Pain Sites  No    Pain Score  9    Pain Location  Back    Pain Orientation  Upper;Lower    Pain Descriptors / Indicators  Dull;Sharp;Shooting    Pain Type  Chronic pain    Pain Onset  More than a month ago    Pain Frequency  Constant    Aggravating Factors   exercise    Pain Relieving Factors  no exercise                       [facility] Adult PT Treatment/Exercise - 09/19/18 0001  Self-Care   Self-Care  Other Self-Care Comments    Other Self-Care Comments   edcuation o nbladder irritants and how they affet the bladder; abdominal massage      Lumbar Exercises: Stretches   Active Hamstring Stretch  Right;Left;2 reps;30 seconds   sitting   Single Knee to Chest Stretch  Right;Left;2 reps;30 seconds    Piriformis Stretch  Right;Left;2 reps;30 seconds   sitting   Other Lumbar Stretch Exercise  butterfly stretch      Lumbar Exercises: Aerobic   Nustep  seat # 7 and arm # 10; level 1; for 5 min while assessing patient      Manual Therapy   Manual Therapy  Soft tissue mobilization;Myofascial release    Soft tissue mobilization  circular massage to abdomen to promote peristalic motion of the large intestines; scar massage to lower abdomen    Myofascial Release  release along the left lower quadrant with release of the mesenteric root, release along the bladder and sac of douglas, release around the umbilicus, release around the left upper quadrant             PT Education - 09/19/18 1314    Education Details  Access Code: [email or web address]; abdominal  massage; bladder irritants    Person(s) Educated  Patient    Methods  Explanation;Demonstration;Handout    Comprehension  Verbalized understanding;Returned demonstration       PT Short Term Goals - 09/12/18 1004      PT SHORT TERM GOAL #1   Title  independent with intial HEP    Time  4    Period  Weeks    Status  New    Target Date  10/10/18      PT SHORT TERM GOAL #2   Title  understand correct toileting technique to relax the pelvic floor with correct breathing    Time  4    Period  Weeks    Status  New    Target Date  10/10/18      PT SHORT TERM GOAL #3   Title  pelvic floor strength is 2/5 so she is able to contract with a circular contraction    Time  4    Period  Weeks    Status  New    Target Date  10/10/18      PT SHORT TERM GOAL #4   Title  understand about bowel health    Time  4    Period  Weeks    Status  New    Target Date  10/10/18        PT Long Term Goals - 09/12/18 1006      PT LONG TERM GOAL #1   Title  independent with HEP and understand how to progress herself    Time  12    Period  Weeks    Status  New    Target Date  12/05/18      PT LONG TERM GOAL #2   Title  able to have a full bowel movement that is Type 3-5 due to improve mechanics    Time  12    Period  Weeks    Status  New    Target Date  12/05/18      PT LONG TERM GOAL #3   Title  pelvic floor strength >/= 3/5 so she does not have to wear a pad and no leakage with coughing and sneezing    Time  12    Period  Weeks    Status  New    Target Date  12/05/18      PT LONG TERM GOAL #4   Title  pressure feeling in the abdomen improves >/= 50% due to having regular bowel movements    Time  12    Period  Weeks    Status  New    Target Date  12/05/18            Plan - 09/19/18 1240    Clinical Impression Statement  Patient has fascial restrictions in the the left upper quadrant and lower abdominal region. Patient understands what bladder irritants are and how they affect  the bladder. Patient has just started therapy and has not met goals yet. Patient will benefit from skilled therapy to improve pelvic floor coordination to have a bowel movement and reduce urinary leakage.    Personal Factors and Comorbidities  Age;Fitness;Comorbidity 1;Comorbidity 2;Sex;Time since onset of injury/illness/exacerbation    Comorbidities  Fibromyalgia; chronic neck and back pain    Examination-Activity Limitations  Continence;Toileting    Stability/Clinical Decision Making  Evolving/Moderate complexity    Rehab Potential  Good    PT Frequency  1x / week    PT Duration  12 weeks    PT Treatment/Interventions  Biofeedback;Cryotherapy;Electrical Stimulation;Moist Heat;Therapeutic exercise;Therapeutic activities;Neuromuscular re-education;Patient/family education;Dry needling;Manual techniques    PT Next Visit Plan  toileting technique, diaphragmatic breathing, tactile cues to contract the pelvic floor; abdominal massage    PT Home Exercise Plan  Access Code: [email or web address]    Recommended Other Services  MD signed intial summary    Consulted and Agree with Plan of Care  Patient       Patient will benefit from skilled therapeutic intervention in order to improve the following deficits and impairments:  Decreased coordination, Increased muscle spasms, Pain, Decreased strength  Visit Diagnosis: Muscle weakness (generalized)  Other lack of coordination  Stress incontinence (female) (female)  Constipation, unspecified constipation type     Problem List Patient Active Problem List   Diagnosis Date Noted  . Acute upper respiratory infection 09/30/2017  . Hyperglycemia 10/14/2016  . DJD (degenerative joint disease), cervical 10/05/2016  . DDD (degenerative disc disease), lumbar 10/05/2016  . Primary osteoarthritis of left knee 10/05/2016  . Acute left-sided low back pain 03/25/2016  . Pain, joint, multiple sites 03/25/2016  . History of insomnia 03/25/2016  . Greater trochanteric  bursitis of both hips 03/25/2016  . Wheezing 02/18/2016  . Chronic pain syndrome 10/11/2015  . Memory dysfunction 04/01/2015  . Status post right partial knee replacement 10/07/2014  . Cough 11/02/2013  . Herpes labialis 11/02/2013  . GERD (gastroesophageal reflux disease) 11/02/2012  . Anxiety 07/01/2011  . Vitamin D deficiency 03/18/2011  . Preventative health care 03/13/2011  . PERIPHERAL NEUROPATHY 10/03/2007  . Hyperlipidemia 01/05/2007  . ANEMIA-IRON DEFICIENCY 01/05/2007  . INSOMNIA-SLEEP DISORDER-UNSPEC 01/05/2007  . Allergic rhinitis 01/05/2007  . Irritable bowel syndrome with constipation 01/05/2007  . Fibromyalgia syndrome 01/05/2007    Earlie Counts, PT 09/19/18 1:26 PM   [facility] [address] [place], [place], [address] Phone: [phone]   Fax:  [phone]  Name: Gabrielle Benson MRN: [id number] Date of Birth: 1955-08-12

## 2018-09-19 NOTE — Patient Instructions (Addendum)
Certain foods and liquids will decrease the pH making the urine more acidic.  Urinary urgency increases when the urine has a low pH.  Most common irritants: alcohol, carbonated beverages and caffinated beverages.  Foods to avoid: apple juice, apples, ascorbic acid, canteloupes, chili, citrus fruits, coffee, cranberries, grapes, guava, peaches, pepper, pineapple, plums, strawberries, tea, tomatoes, and vinegar.  Drinking plenty of water may help to increase the pH and dilute out any of the effects of specific irritants.  Foods that are NOT irritating to the bladder include: Pears, papayas, sun-brewed teas, watermelons, non-citrus herbal teas, apricots, kava and low-acid instant drinks (Postum)  About Abdominal Massage  Abdominal massage, also called external colon massage, is a self-treatment circular massage technique that can reduce and eliminate gas and ease constipation. The colon naturally contracts in waves in a clockwise direction starting from inside the right hip, moving up toward the ribs, across the belly, and down inside the left hip.  When you perform circular abdominal massage, you help stimulate your colon's normal wave pattern of movement called peristalsis.  It is most beneficial when done after eating.  Positioning You can practice abdominal massage with oil while lying down, or in the shower with soap.  Some people find that it is just as effective to do the massage through clothing while sitting or standing.  How to Massage Start by placing your finger tips or knuckles on your right side, just inside your hip bone.  . Make small circular movements while you move upward toward your rib cage.   . Once you reach the bottom right side of your rib cage, take your circular movements across to the left side of the bottom of your rib cage.  . Next, move downward until you reach the inside of your left hip bone.  This is the path your feces travel in your colon. . Continue to perform  your abdominal massage in this pattern for 10 minutes each day.     You can apply as much pressure as is comfortable in your massage.  Start gently and build pressure as you continue to practice.  Notice any areas of pain as you massage; areas of slight pain may be relieved as you massage, but if you have areas of significant or intense pain, consult with your healthcare provider.  Other Considerations . General physical activity including bending and stretching can have a beneficial massage-like effect on the colon.  Deep breathing can also stimulate the colon because breathing deeply activates the same nervous system that supplies the colon.   . Abdominal massage should always be used in combination with a bowel-conscious diet that is high in the proper type of fiber for you, fluids (primarily water), and a regular exercise program. [facility] [address] [place], [place] [address] Phone # [phone] Fax [phone]  Access Code: [email or web address]  URL: [email or web address]  Date: 09/19/2018  Prepared by: Earlie Counts   Exercises Seated Hamstring Stretch - 2 reps - 1 sets - 30 sec hold - 1x daily - 7x weekly Seated Figure 4 Piriformis Stretch - 2 reps - 1 sets - 30 sec hold - 1x daily - 7x weekly Supine Butterfly Groin Stretch - 2 reps - 1 sets - 30 sec hold - 1x daily - 7x weekly Supine Single Knee to Chest - 2 reps - 1 sets - 30 sec hold - 1x daily - 7x weekly

## 2018-09-19 NOTE — Telephone Encounter (Signed)
Last Visit: 08/31/18 Next Visit: 03/01/19  Okay to refill per Dr. Estanislado Pandy

## 2018-09-27 ENCOUNTER — Encounter: Payer: Self-pay | Admitting: Physical Therapy

## 2018-09-27 ENCOUNTER — Ambulatory Visit: Payer: 59 | Admitting: Physical Therapy

## 2018-09-27 ENCOUNTER — Other Ambulatory Visit: Payer: Self-pay

## 2018-09-27 DIAGNOSIS — M6281 Muscle weakness (generalized): Secondary | ICD-10-CM

## 2018-09-27 DIAGNOSIS — R278 Other lack of coordination: Secondary | ICD-10-CM

## 2018-09-27 DIAGNOSIS — N393 Stress incontinence (female) (male): Secondary | ICD-10-CM

## 2018-09-27 DIAGNOSIS — K5900 Constipation, unspecified: Secondary | ICD-10-CM

## 2018-09-27 NOTE — Patient Instructions (Addendum)
Toileting Techniques for Bowel Movements (Defecation) Using your belly (abdomen) and pelvic floor muscles to have a bowel movement is usually instinctive.  Sometimes people can have problems with these muscles and have to relearn proper defecation (emptying) techniques.  If you have weakness in your muscles, organs that are falling out, decreased sensation in your pelvis, or ignore your urge to go, you may find yourself straining to have a bowel movement.  You are straining if you are: . holding your breath or taking in a huge gulp of air and holding it  . keeping your lips and jaw tensed and closed tightly . turning red in the face because of excessive pushing or forcing . developing or worsening your  hemorrhoids . getting faint while pushing . not emptying completely and have to defecate many times a day  If you are straining, you are actually making it harder for yourself to have a bowel movement.  Many people find they are pulling up with the pelvic floor muscles and closing off instead of opening the anus. Due to lack pelvic floor relaxation and coordination the abdominal muscles, one has to work harder to push the feces out.  Many people have never been taught how to defecate efficiently and effectively.  Notice what happens to your body when you are having a bowel movement.  While you are sitting on the toilet pay attention to the following areas: . Jaw and mouth position . Angle of your hips   . Whether your feet touch the ground or not . Arm placement  . Spine position . Waist . Belly tension . Anus (opening of the anal canal)  An Evacuation/Defecation Plan   Here are the 4 basic points:  1. Lean forward enough for your elbows to rest on your knees 2. Support your feet on the floor or use a low stool if your feet don't touch the floor  3. Push out your belly as if you have swallowed a beach ball-you should feel a widening of your waist 4. Open and relax your pelvic floor muscles,  rather than tightening around the anus       The following conditions my require modifications to your toileting posture:  . If you have had surgery in the past that limits your back, hip, pelvic, knee or ankle flexibility . Constipation   Your healthcare practitioner may make the following additional suggestions and adjustments:  1) Sit on the toilet  a) Make sure your feet are supported. b) Notice your hip angle and spine position-most people find it effective to lean forward or raise their knees, which can help the muscles around the anus to relax  c) When you lean forward, place your forearms on your thighs for support  2) Relax suggestions a) Breath deeply in through your nose and out slowly through your mouth as if you are smelling the flowers and blowing out the candles. b) To become aware of how to relax your muscles, contracting and releasing muscles can be helpful.  Pull your pelvic floor muscles in tightly by using the image of holding back gas, or closing around the anus (visualize making a circle smaller) and lifting the anus up and in.  Then release the muscles and your anus should drop down and feel open. Repeat 5 times ending with the feeling of relaxation. c) Keep your pelvic floor muscles relaxed; let your belly bulge out. d) The digestive tract starts at the mouth and ends at the anal opening, so   be sure to relax both ends of the tube.  Place your tongue on the roof of your mouth with your teeth separated.  This helps relax your mouth and will help to relax the anus at the same time.  3) Empty (defecation) a) Keep your pelvic floor and sphincter relaxed, then bulge your anal muscles.  Make the anal opening wide.  b) Stick your belly out as if you have swallowed a beach ball. c) Make your belly wall hard using your belly muscles while continuing to breathe. Doing this makes it easier to open your anus. d) Breath out and give a grunt (or try using other sounds such as  ahhhh, shhhhh, ohhhh or grrrrrrr).  4) Finish a) As you finish your bowel movement, pull the pelvic floor muscles up and in.  This will leave your anus in the proper place rather than remaining pushed out and down. If you leave your anus pushed out and down, it will start to feel as though that is normal and give you incorrect signals about needing to have a bowel movement.   Access Code: [email or web address]  URL: [email or web address]  Date: 09/27/2018  Prepared by: Earlie Counts   Exercises Seated Hamstring Stretch - 2 reps - 1 sets - 30 sec hold - 1x daily - 7x weekly Seated Figure 4 Piriformis Stretch - 2 reps - 1 sets - 30 sec hold - 1x daily - 7x weekly Supine Butterfly Groin Stretch - 2 reps - 1 sets - 30 sec hold - 1x daily - 7x weekly Supine Single Knee to Chest - 2 reps - 1 sets - 30 sec hold - 1x daily - 7x weekly Supine Hip Adduction Isometric with Ball - 10 reps - 1 sets - 5 sec hold - 1x daily - 7x weekly Supine Bridge with Mini Swiss Ball Between Knees - 10 reps - 1 sets - 1x daily - 7x weekly [facility] [address], [place], [place]rielle Benson [address] Phone # [phone] Fax [phone]

## 2018-09-27 NOTE — Therapy (Signed)
[facility] [address], [place], [place], [address] Phone: [phone]   Fax:  [phone]  Physical Therapy Treatment  Patient Details  Name: Gabrielle [place] MRN: [id number] Date of Birth: [birth year] Referring Provider (PT): Dr. Silvano Rusk   Encounter Date: 09/27/2018  PT End of Session - 09/27/18 0759    Visit Number  3    Date for PT Re-Evaluation  12/05/18    Authorization Type  [employer]    PT Start Time  0745   came late   PT Stop Time  0810    PT Time Calculation (min)  25 min    Activity Tolerance  Patient tolerated treatment well;No increased pain    Behavior During Therapy  WFL for tasks assessed/performed       Past Medical History:  Diagnosis Date  . Allergic rhinitis   . ANA positive   . Anemia    Iron deficiency  . Anxiety   . Cervicalgia    Chronic  . Complication of anesthesia    hard to go to sleep with Anesthesia-had to have Benadryl with Colonoscopy  . DDD (degenerative disc disease)    Cervical  . DJD (degenerative joint disease)   . Fibromyalgia   . GERD (gastroesophageal reflux disease)   . Hyperlipidemia   . Multinodular thyroid   . Neuropathy, peripheral   . Pneumonia   . Vitamin D deficiency 03/18/2011    Past Surgical History:  Procedure Laterality Date  . ABDOMINAL HYSTERECTOMY    . ABDOMINAL HYSTERECTOMY     partial  . BUNIONECTOMY  2011   left foot  . DILATION AND CURETTAGE OF UTERUS    . KNEE ARTHROSCOPY  2004   right  . MYOMECTOMY  2002   prior to hysterectomy  . PARTIAL KNEE ARTHROPLASTY Right 10/07/2014   Procedure: RIGHT UNI KNEE ARTHROPLASTY LATERALLY ;  Surgeon: Paralee Cancel, MD;  Location: [facility];  Service: Orthopedics;  Laterality: Right;  . TONSILLECTOMY     age 4  . TOTAL SHOULDER ARTHROPLASTY Left 04/28/2017    There were no vitals filed for this visit.  Subjective Assessment - 09/27/18 0748    Subjective  Massaging the stomach is helping me  alot. I had not had urinary leakage since last visit. I had sneezed 2 times.    Patient Stated Goals  learn exercises to control the urine and pelvic floor    Currently in Pain?  Yes    Pain Score  3     Pain Location  Abdomen    Pain Orientation  Left;Right;Lateral    Pain Descriptors / Indicators  Sore;Pressure    Pain Type  Chronic pain    Pain Onset  More than a month ago    Pain Frequency  Intermittent    Aggravating Factors   when no BM and when pressing on the abdomen    Pain Relieving Factors  BM    Multiple Pain Sites  No                       [facility] Adult PT Treatment/Exercise - 09/27/18 0001      Therapeutic Activites    Therapeutic Activities  Other Therapeutic Activities    Other Therapeutic Activities  instruction on correct toileting technique with diaphragmatic breathing, how to increase the abdominal pressure to push the stool out      Lumbar Exercises: Aerobic   Nustep  seat # 7 and arm #  10; level 1; for 5 min while assessing patient      Lumbar Exercises: Supine   Bridge with [employer]  10 reps    Bridge with [employer] Limitations  with pelvic floor contraction    Other Supine Lumbar Exercises  hookly ball squeeze with pelvic floor contraction holding 5 sec 10x             PT Education - 09/27/18 0809    Education Details  Access Code: [email or web address]; toileting technique    Person(s) Educated  Patient    Methods  Explanation;Demonstration;Handout    Comprehension  Verbalized understanding;Returned demonstration       PT Short Term Goals - 09/27/18 0814      PT SHORT TERM GOAL #1   Title  independent with intial HEP    Time  4    Period  Weeks    Status  Achieved    Target Date  10/10/18      PT SHORT TERM GOAL #2   Title  understand correct toileting technique to relax the pelvic floor with correct breathing    Baseline  jst learned    Time  4    Period  Weeks    Status  On-going      PT SHORT TERM GOAL #3   Title   pelvic floor strength is 2/5 so she is able to contract with a circular contraction    Time  4    Period  Weeks    Status  On-going    Target Date  10/10/18        PT Long Term Goals - 09/12/18 1006      PT LONG TERM GOAL #1   Title  independent with HEP and understand how to progress herself    Time  12    Period  Weeks    Status  New    Target Date  12/05/18      PT LONG TERM GOAL #2   Title  able to have a full bowel movement that is Type 3-5 due to improve mechanics    Time  12    Period  Weeks    Status  New    Target Date  12/05/18      PT LONG TERM GOAL #3   Title  pelvic floor strength >/= 3/5 so she does not have to wear a pad and no leakage with coughing and sneezing    Time  12    Period  Weeks    Status  New    Target Date  12/05/18      PT LONG TERM GOAL #4   Title  pressure feeling in the abdomen improves >/= 50% due to having regular bowel movements    Time  12    Period  Weeks    Status  New    Target Date  12/05/18            Plan - 09/27/18 0751    Clinical Impression Statement  Patient has not leaked urine since last visit. Patient feels the abdominal massage is helping. Patient has learned correct toileting technique to push the stool out. Patient is now using co-contraction of muscles to assist in pelvic floor contraction. Patient will benefit from skilled therapy to improve pelvic floor coordination to have a bowel movement and reduce urinary leakage.    Personal Factors and Comorbidities  Age;Fitness;Comorbidity 1;Comorbidity 2;Sex;Time since onset of injury/illness/exacerbation    Comorbidities  Fibromyalgia;  chronic neck and back pain    Examination-Activity Limitations  Continence;Toileting    Stability/Clinical Decision Making  Evolving/Moderate complexity    Rehab Potential  Good    PT Frequency  1x / week    PT Duration  12 weeks    PT Treatment/Interventions  Biofeedback;Cryotherapy;Electrical Stimulation;Moist Heat;Therapeutic  exercise;Therapeutic activities;Neuromuscular re-education;Patient/family education;Dry needling;Manual techniques    PT Next Visit Plan  tactile cues to contract the pelvic floor; abdominal massage on left lower quadrant; education on bowel health    PT Home Exercise Plan  Access Code: [email or web address]    Consulted and Agree with Plan of Care  Patient       Patient will benefit from skilled therapeutic intervention in order to improve the following deficits and impairments:  Decreased coordination, Increased muscle spasms, Pain, Decreased strength  Visit Diagnosis: Muscle weakness (generalized)  Other lack of coordination  Stress incontinence (female) (female)  Constipation, unspecified constipation type     Problem List Patient Active Problem List   Diagnosis Date Noted  . Acute upper respiratory infection 09/30/2017  . Hyperglycemia 10/14/2016  . DJD (degenerative joint disease), cervical 10/05/2016  . DDD (degenerative disc disease), lumbar 10/05/2016  . Primary osteoarthritis of left knee 10/05/2016  . Acute left-sided low back pain 03/25/2016  . Pain, joint, multiple sites 03/25/2016  . History of insomnia 03/25/2016  . Greater trochanteric bursitis of both hips 03/25/2016  . Wheezing 02/18/2016  . Chronic pain syndrome 10/11/2015  . Memory dysfunction 04/01/2015  . Status post right partial knee replacement 10/07/2014  . Cough 11/02/2013  . Herpes labialis 11/02/2013  . GERD (gastroesophageal reflux disease) 11/02/2012  . Anxiety 07/01/2011  . Vitamin D deficiency 03/18/2011  . Preventative health care 03/13/2011  . PERIPHERAL NEUROPATHY 10/03/2007  . Hyperlipidemia 01/05/2007  . ANEMIA-IRON DEFICIENCY 01/05/2007  . INSOMNIA-SLEEP DISORDER-UNSPEC 01/05/2007  . Allergic rhinitis 01/05/2007  . Irritable bowel syndrome with constipation 01/05/2007  . Fibromyalgia syndrome 01/05/2007    Earlie Counts, PT 09/27/18 8:15 AM   [facility] [address] [place], [place], [address] Phone: [phone]   Fax:  [phone]  Name: Maesa Paine MRN: [id number] Date of Birth: [birth year]

## 2018-10-02 ENCOUNTER — Other Ambulatory Visit: Payer: Self-pay

## 2018-10-02 ENCOUNTER — Ambulatory Visit: Payer: 59 | Admitting: Physical Therapy

## 2018-10-02 DIAGNOSIS — M6281 Muscle weakness (generalized): Secondary | ICD-10-CM | POA: Diagnosis not present

## 2018-10-02 DIAGNOSIS — K5900 Constipation, unspecified: Secondary | ICD-10-CM

## 2018-10-02 DIAGNOSIS — R278 Other lack of coordination: Secondary | ICD-10-CM

## 2018-10-02 DIAGNOSIS — N393 Stress incontinence (female) (male): Secondary | ICD-10-CM

## 2018-10-02 NOTE — Patient Instructions (Addendum)
Lubrication . Used for intercourse to reduce friction . Avoid ones that have glycerin, warming gels, tingling gels, icing or cooling gel, scented . Avoid parabens due to a preservative similar to female sex hormone . May need to be reapplied once or several times during sexual activity . Can be applied to both partners genitals prior to vaginal penetration to minimize friction or irritation . Prevent irritation and mucosal tears that cause post coital pain and increased the risk of vaginal and urinary tract infections . Oil-based lubricants cannot be used with condoms due to breaking them down.  Least likely to irritate vaginal tissue.  . Plant based-lubes are safe . Silicone-based lubrication are thicker and last long and used for post-menopausal women  Vaginal Lubricators Here is a list of some suggested lubricators you can use for intercourse. Use the most hypoallergenic product.  You can place on you or your partner.   Slippery Stuff  Sylk or Sliquid Natural H2O ( good  if frequent UTI's)  Blossom Organics (www.blossom-organics.com)  Luvena   Coconut oil  PJur Woman Nude- water based lubricant, amazon  Uberlube- Amazon  Aloe Vera  Yes lubricant- [employer] Platinum-Silicone, Target, [facility]  Olive and Bee intimate cream-  www.oliveandbee.com.au  Pink - Amazon Things to avoid in lubricants are glycerin, warming gels, tingling gels, icing or cooling  gels, and scented gels.  Also avoid Vaseline. KY jelly, Replens, and Astroglide kills good bacteria(lactobacilli)  Things to avoid in the vaginal area . Do not use things to irritate the vulvar area . No lotions- see below . No soaps; can use Aveeno or Calendula cleanser if needed. Must be gentle . No deodorants . No douches . Good to sleep without underwear to let the vaginal area to air out . No scrubbing: spread the lips to let warm water rinse over labias and pat dry  Creams that can be used on the [place] Releveum or [employer] Gele     Cough: Phase 2 (Supine)    Lie flat. Squeeze pelvic floor and hold. Inhale. Lift head and shoulders. Exhale. Relax. Repeat __5_ times. Do __2_ times a day.   Copyright  VHI. All rights reserved.  [facility] [address] [place], [place] [address] Phone # [phone] Fax [phone] Introduction to [employer] and daily habits can help you predict when your bowels will move on a regular basis.  The consistency and quantity of the stool is usually more important than the frequency.  The goal is to have a regular bowel movement that is soft but formed.   Tips on Emptying Regularly . Eat breakfast.  Usually the best time of day for a bowel movement will be a half hour to an hour after eating.  These times are best because the body uses the gastrocolic reflex, a stimulation of bowel motion that occurs with eating, to help produce a bowel movement.  For some people even a simple hot drink in the morning can help the reflex action begin. . Eat all your meals at a predictable time each day.  The bowel functions best when food is introduced at the same regular intervals. . The amount of food eaten at a given time of day should be about the same size from day to day.  The bowel functions best when food is introduced in similar  quantities from day to day. It is fine to have a small breakfast and a large lunch, or vice versa, just be consistent. . Eat two servings of fruit or vegetables and at least one serving of a complex carbohydrates (whole grains such as brown rice, bran, whole wheat bread, or oatmeal) at each meal. . Drink plenty of water-ideally eight glasses a day.  Be sure to increase your water intake if you are increasing fiber into your diet.  Maintain Healthy Habits . Exercise daily.  You may  exercise at any time of day, but you may find that bowel function is helped most if the exercise is at a consistent time each day. . Make sure that you are not rushed and have convenient access to a bathroom at your selected time to empty your bowels. Marland Kitchen

## 2018-10-02 NOTE — Therapy (Signed)
[facility] [address] [place], [place], [address] Phone: [phone]   Fax:  [phone]  Physical Therapy Treatment  Patient Details  Name: Gabrielle [place] MRN: [id number] Date of Birth: 07/11/1955 Referring Provider (PT): Dr. Silvano Rusk   Encounter Date: 10/02/2018  PT End of Session - 10/02/18 0741    Visit Number  4    Date for PT Re-Evaluation  12/05/18    Authorization Type  [employer]    PT Start Time  0740   came late   PT Stop Time  0810    PT Time Calculation (min)  30 min    Activity Tolerance  Patient tolerated treatment well;No increased pain    Behavior During Therapy  WFL for tasks assessed/performed       Past Medical History:  Diagnosis Date  . Allergic rhinitis   . ANA positive   . Anemia    Iron deficiency  . Anxiety   . Cervicalgia    Chronic  . Complication of anesthesia    hard to go to sleep with Anesthesia-had to have Benadryl with Colonoscopy  . DDD (degenerative disc disease)    Cervical  . DJD (degenerative joint disease)   . Fibromyalgia   . GERD (gastroesophageal reflux disease)   . Hyperlipidemia   . Multinodular thyroid   . Neuropathy, peripheral   . Pneumonia   . Vitamin D deficiency 03/18/2011    Past Surgical History:  Procedure Laterality Date  . ABDOMINAL HYSTERECTOMY    . ABDOMINAL HYSTERECTOMY     partial  . BUNIONECTOMY  2011   left foot  . DILATION AND CURETTAGE OF UTERUS    . KNEE ARTHROSCOPY  2004   right  . MYOMECTOMY  2002   prior to hysterectomy  . PARTIAL KNEE ARTHROPLASTY Right 10/07/2014   Procedure: RIGHT UNI KNEE ARTHROPLASTY LATERALLY ;  Surgeon: Paralee Cancel, MD;  Location: [facility];  Service: Orthopedics;  Laterality: Right;  . TONSILLECTOMY     age 60  . TOTAL SHOULDER ARTHROPLASTY Left 04/28/2017    There were no vitals filed for this visit.  Subjective Assessment - 10/02/18 0742    Subjective  I had leak 1-2 times with a sneeze.  When I stand it will leak. I have been doing my exercises. I have not taken my Linzess and only one day I have not had a bowel movement. It helps with the abdominal massage.    Patient Stated Goals  learn exercises to control the urine and pelvic floor    Currently in Pain?  Yes    Pain Location  Abdomen    Pain Orientation  Left;Right;Lateral    Pain Descriptors / Indicators  Sore    Pain Type  Chronic pain    Pain Onset  More than a month ago    Pain Frequency  Intermittent    Aggravating Factors   when no BM or press on the abdomen    Pain Relieving Factors  BM    Multiple Pain Sites  No                    Pelvic Floor Special Questions - 10/02/18 0001    Pelvic Floor Internal Exam  Patient comfirms identification and approves PT to assess pelvic floor and treatment    Exam Type  Vaginal    Palpation  tightness on left levator ani and right obturator internist    Strength  fair squeeze, definite  lift        [facility] Adult PT Treatment/Exercise - 10/02/18 0001      Self-Care   Self-Care  Other Self-Care Comments    Other Self-Care Comments   discussed intofromation on the vaginal lubricants and moisturizers to improve tissue, education on bowel health and eating more fiber and fruits and vegetables      Neuro Re-ed    Neuro Re-ed Details   pelvic floor contraction holding for 10 seconds, supine pelvic floor contraction with lifting head and breath out      Lumbar Exercises: Stretches   Piriformis Stretch  Right;Left;2 reps;30 seconds   sitting     Lumbar Exercises: Aerobic   Nustep  seat # 7 and arm # 10; level 1; for 5 min while assessing patient      Manual Therapy   Manual Therapy  Internal Pelvic Floor    Internal Pelvic Floor  soft tissue work to the introitus, along the left levator ani, and right obturator internist with contract relax             PT Education - 10/02/18 0803    Education Details  vaginal lubricants; supine lift head with pelvic  floor contraction    Person(s) Educated  Patient    Methods  Explanation;Demonstration;Handout    Comprehension  Verbalized understanding;Returned demonstration       PT Short Term Goals - 10/02/18 0820      PT SHORT TERM GOAL #1   Title  independent with intial HEP    Time  4    Period  Weeks    Status  Achieved    Target Date  10/10/18      PT SHORT TERM GOAL #2   Title  understand correct toileting technique to relax the pelvic floor with correct breathing    Time  4    Period  Weeks    Status  Achieved    Target Date  10/10/18      PT SHORT TERM GOAL #3   Title  pelvic floor strength is 2/5 so she is able to contract with a circular contraction    Time  4    Period  Weeks    Status  Achieved    Target Date  10/10/18      PT SHORT TERM GOAL #4   Title  understand about bowel health    Time  4    Period  Weeks    Status  Achieved    Target Date  10/10/18        PT Long Term Goals - 09/12/18 1006      PT LONG TERM GOAL #1   Title  independent with HEP and understand how to progress herself    Time  12    Period  Weeks    Status  New    Target Date  12/05/18      PT LONG TERM GOAL #2   Title  able to have a full bowel movement that is Type 3-5 due to improve mechanics    Time  12    Period  Weeks    Status  New    Target Date  12/05/18      PT LONG TERM GOAL #3   Title  pelvic floor strength >/= 3/5 so she does not have to wear a pad and no leakage with coughing and sneezing    Time  12    Period  Weeks  Status  New    Target Date  12/05/18      PT LONG TERM GOAL #4   Title  pressure feeling in the abdomen improves >/= 50% due to having regular bowel movements    Time  12    Period  Weeks    Status  New    Target Date  12/05/18            Plan - 10/02/18 0744    Clinical Impression Statement  Patient has leaked urine 2 times with cough in standing. Patient is having a bowel movement daily with exception of 1 day and not using Linzess.  Patient had reduction in bilateral buttocks pain after soft tissue work. Patient pelvic floor strength increased to 3/5. Patient took several trials to contract the pelvic floor in supine with lifting her head. Patient will benefit from skilled therapy to improve pelvic floor coordination to have a bowel movement and reduce urinary leakage.    Personal Factors and Comorbidities  Age;Fitness;Comorbidity 1;Comorbidity 2;Sex;Time since onset of injury/illness/exacerbation    Comorbidities  Fibromyalgia; chronic neck and back pain    Examination-Activity Limitations  Continence;Toileting    Stability/Clinical Decision Making  Evolving/Moderate complexity    Rehab Potential  Good    PT Frequency  1x / week    PT Duration  12 weeks    PT Treatment/Interventions  Biofeedback;Cryotherapy;Electrical Stimulation;Moist Heat;Therapeutic exercise;Therapeutic activities;Neuromuscular re-education;Patient/family education;Dry needling;Manual techniques    PT Next Visit Plan  tactile cues to contract the pelvic floor; internal work on right obturator internist and left levator ani; supine lift head and cough    PT Home Exercise Plan  Access Code: [email or web address]    Consulted and Agree with Plan of Care  Patient       Patient will benefit from skilled therapeutic intervention in order to improve the following deficits and impairments:  Decreased coordination, Increased muscle spasms, Pain, Decreased strength  Visit Diagnosis: Muscle weakness (generalized)  Other lack of coordination  Stress incontinence (female) (female)  Constipation, unspecified constipation type     Problem List Patient Active Problem List   Diagnosis Date Noted  . Acute upper respiratory infection 09/30/2017  . Hyperglycemia 10/14/2016  . DJD (degenerative joint disease), cervical 10/05/2016  . DDD (degenerative disc disease), lumbar 10/05/2016  . Primary osteoarthritis of left knee 10/05/2016  . Acute left-sided low back pain  03/25/2016  . Pain, joint, multiple sites 03/25/2016  . History of insomnia 03/25/2016  . Greater trochanteric bursitis of both hips 03/25/2016  . Wheezing 02/18/2016  . Chronic pain syndrome 10/11/2015  . Memory dysfunction 04/01/2015  . Status post right partial knee replacement 10/07/2014  . Cough 11/02/2013  . Herpes labialis 11/02/2013  . GERD (gastroesophageal reflux disease) 11/02/2012  . Anxiety 07/01/2011  . Vitamin D deficiency 03/18/2011  . Preventative health care 03/13/2011  . PERIPHERAL NEUROPATHY 10/03/2007  . Hyperlipidemia 01/05/2007  . ANEMIA-IRON DEFICIENCY 01/05/2007  . INSOMNIA-SLEEP DISORDER-UNSPEC 01/05/2007  . Allergic rhinitis 01/05/2007  . Irritable bowel syndrome with constipation 01/05/2007  . Fibromyalgia syndrome 01/05/2007    Earlie Counts, PT 10/02/18 8:22 AM   [facility] [address], [place], [place], [address] Phone: [phone]   Fax:  [phone]  Name: Gabrielle [place] MRN: [id number] Date of Birth: 13-Jan-1956

## 2018-10-07 LAB — HM MAMMOGRAPHY

## 2018-10-11 ENCOUNTER — Ambulatory Visit: Payer: 59 | Admitting: Physical Therapy

## 2018-10-12 ENCOUNTER — Encounter: Payer: Self-pay | Admitting: Physical Therapy

## 2018-10-12 ENCOUNTER — Other Ambulatory Visit: Payer: Self-pay

## 2018-10-12 ENCOUNTER — Ambulatory Visit: Payer: 59 | Admitting: Physical Therapy

## 2018-10-12 DIAGNOSIS — K5900 Constipation, unspecified: Secondary | ICD-10-CM

## 2018-10-12 DIAGNOSIS — N393 Stress incontinence (female) (male): Secondary | ICD-10-CM

## 2018-10-12 DIAGNOSIS — R278 Other lack of coordination: Secondary | ICD-10-CM

## 2018-10-12 DIAGNOSIS — M6281 Muscle weakness (generalized): Secondary | ICD-10-CM | POA: Diagnosis not present

## 2018-10-12 NOTE — Patient Instructions (Addendum)
Cough: Phase 4 (Supine)    Lie flat. Squeeze pelvic floor and hold. Inhale. Lift head and shoulders. Cough repeatedly. Relax. Repeat _5__ times. Do __2_ times a day.   Copyright  VHI. All rights reserved.  [place] [facility] [address], [place], [place] [address] Phone # [phone] Fax [phone]

## 2018-10-12 NOTE — Therapy (Signed)
[facility] [address] [place], [place], [address] Phone: [phone]   Fax:  [phone]  Physical Therapy Treatment  Patient Details  Name: Gabrielle Benson MRN: [id number] Date of Birth: 1955-07-05 Referring Provider (PT): Dr. Silvano Rusk   Encounter Date: 10/12/2018  PT End of Session - 10/12/18 1229    Visit Number  5    Date for PT Re-Evaluation  12/05/18    Authorization Type  [employer]    PT Start Time  [id number]    PT Stop Time  1223    PT Time Calculation (min)  38 min    Activity Tolerance  Patient tolerated treatment well;No increased pain    Behavior During Therapy  WFL for tasks assessed/performed       Past Medical History:  Diagnosis Date  . Allergic rhinitis   . ANA positive   . Anemia    Iron deficiency  . Anxiety   . Cervicalgia    Chronic  . Complication of anesthesia    hard to go to sleep with Anesthesia-had to have Benadryl with Colonoscopy  . DDD (degenerative disc disease)    Cervical  . DJD (degenerative joint disease)   . Fibromyalgia   . GERD (gastroesophageal reflux disease)   . Hyperlipidemia   . Multinodular thyroid   . Neuropathy, peripheral   . Pneumonia   . Vitamin D deficiency 03/18/2011    Past Surgical History:  Procedure Laterality Date  . ABDOMINAL HYSTERECTOMY    . ABDOMINAL HYSTERECTOMY     partial  . BUNIONECTOMY  2011   left foot  . DILATION AND CURETTAGE OF UTERUS    . KNEE ARTHROSCOPY  2004   right  . MYOMECTOMY  2002   prior to hysterectomy  . PARTIAL KNEE ARTHROPLASTY Right 10/07/2014   Procedure: RIGHT UNI KNEE ARTHROPLASTY LATERALLY ;  Surgeon: Paralee Cancel, MD;  Location: [facility];  Service: Orthopedics;  Laterality: Right;  . TONSILLECTOMY     age 63  . TOTAL SHOULDER ARTHROPLASTY Left 04/28/2017    There were no vitals filed for this visit.  Subjective Assessment - 10/12/18 1147    Subjective  Only one day I have missed a bowel movement. I  got vitamin E suppositiories. I can tell a difference with decreased pain. I am getting the julva cream for the outside of the vulva.    Patient Stated Goals  learn exercises to control the urine and pelvic floor    Currently in Pain?  No/denies    Multiple Pain Sites  No                       [facility] Adult PT Treatment/Exercise - 10/12/18 0001      Neuro Re-ed    Neuro Re-ed Details   left head with pelvic floor contraction and cough; discussed her HEP and how she was doing with it      Manual Therapy   Manual Therapy  Myofascial release;Soft tissue mobilization    Soft tissue mobilization  diaphragm and along the lower abdominal scar to elongate the tissue    Myofascial Release  release of the midsection of abdoment to soften the tissue due to being so tight and tender ;release around the bladder             PT Education - 10/12/18 1226    Education Details  pelvic floor contraction with head lift and cough    Person(s) Educated  Patient    Methods  Explanation;Demonstration;Handout    Comprehension  Verbalized understanding;Returned demonstration       PT Short Term Goals - 10/02/18 0820      PT SHORT TERM GOAL #1   Title  independent with intial HEP    Time  4    Period  Weeks    Status  Achieved    Target Date  10/10/18      PT SHORT TERM GOAL #2   Title  understand correct toileting technique to relax the pelvic floor with correct breathing    Time  4    Period  Weeks    Status  Achieved    Target Date  10/10/18      PT SHORT TERM GOAL #3   Title  pelvic floor strength is 2/5 so she is able to contract with a circular contraction    Time  4    Period  Weeks    Status  Achieved    Target Date  10/10/18      PT SHORT TERM GOAL #4   Title  understand about bowel health    Time  4    Period  Weeks    Status  Achieved    Target Date  10/10/18        PT Long Term Goals - 10/12/18 1154      PT LONG TERM GOAL #1   Title  independent with  HEP and understand how to progress herself    Time  12    Period  Weeks    Status  On-going      PT LONG TERM GOAL #2   Title  able to have a full bowel movement that is Type 3-5 due to improve mechanics    Baseline  type 6, taking less Linzess    Time  12    Period  Weeks    Status  On-going      PT LONG TERM GOAL #3   Title  pelvic floor strength >/= 3/5 so she does not have to wear a pad and no leakage with coughing and sneezing    Time  12    Period  Weeks    Status  On-going      PT LONG TERM GOAL #4   Title  pressure feeling in the abdomen improves >/= 50% due to having regular bowel movements    Baseline  60% better    Time  12    Period  Weeks    Status  Achieved            Plan - 10/12/18 1157    Clinical Impression Statement  Pressure on the abdomen is 60% better. Patient had one time of urinary leakage during the weekend. Patient has started to use the vitamin E in the vaginal canal and feels less pain now. Patient has had bowel movements Type 6 daily except for 1 day. Patient had lots of fascial restrictions  in the abdomen and after manaual work there was less tender points. Patient will benefit from skilled therapy to improve pelvic floor coordination to have a bowel movement and reduce urinary leakage.    Personal Factors and Comorbidities  Age;Fitness;Comorbidity 1;Comorbidity 2;Sex;Time since onset of injury/illness/exacerbation    Comorbidities  Fibromyalgia; chronic neck and back pain    Examination-Activity Limitations  Continence;Toileting    Stability/Clinical Decision Making  Evolving/Moderate complexity    Rehab Potential  Good    PT Frequency  1x / week    PT Duration  12 weeks    PT Treatment/Interventions  Biofeedback;Cryotherapy;Electrical Stimulation;Moist Heat;Therapeutic exercise;Therapeutic activities;Neuromuscular re-education;Patient/family education;Dry needling;Manual techniques    PT Next Visit Plan  tactile cues to contract the pelvic  floor; internal work on right obturator internist and left levator ani; sitting cough progression; check abdominal tissue    PT Home Exercise Plan  Access Code: [email or web address]    Consulted and Agree with Plan of Care  Patient       Patient will benefit from skilled therapeutic intervention in order to improve the following deficits and impairments:  Decreased coordination, Increased muscle spasms, Pain, Decreased strength  Visit Diagnosis: Muscle weakness (generalized)  Other lack of coordination  Stress incontinence (female) (female)  Constipation, unspecified constipation type     Problem List Patient Active Problem List   Diagnosis Date Noted  . Acute upper respiratory infection 09/30/2017  . Hyperglycemia 10/14/2016  . DJD (degenerative joint disease), cervical 10/05/2016  . DDD (degenerative disc disease), lumbar 10/05/2016  . Primary osteoarthritis of left knee 10/05/2016  . Acute left-sided low back pain 03/25/2016  . Pain, joint, multiple sites 03/25/2016  . History of insomnia 03/25/2016  . Greater trochanteric bursitis of both hips 03/25/2016  . Wheezing 02/18/2016  . Chronic pain syndrome 10/11/2015  . Memory dysfunction 04/01/2015  . Status post right partial knee replacement 10/07/2014  . Cough 11/02/2013  . Herpes labialis 11/02/2013  . GERD (gastroesophageal reflux disease) 11/02/2012  . Anxiety 07/01/2011  . Vitamin D deficiency 03/18/2011  . Preventative health care 03/13/2011  . PERIPHERAL NEUROPATHY 10/03/2007  . Hyperlipidemia 01/05/2007  . ANEMIA-IRON DEFICIENCY 01/05/2007  . INSOMNIA-SLEEP DISORDER-UNSPEC 01/05/2007  . Allergic rhinitis 01/05/2007  . Irritable bowel syndrome with constipation 01/05/2007  . Fibromyalgia syndrome 01/05/2007    Earlie Counts, PT 10/12/18 12:33 PM   [facility] [address], [place], [place], [address] Phone: [phone]   Fax:  [phone]  Name: Gabrielle Benson MRN: [id number] Date of Birth: [birth year]

## 2018-10-16 ENCOUNTER — Encounter: Payer: Self-pay | Admitting: Physical Therapy

## 2018-10-16 ENCOUNTER — Other Ambulatory Visit: Payer: Self-pay

## 2018-10-16 ENCOUNTER — Ambulatory Visit: Payer: 59 | Admitting: Physical Therapy

## 2018-10-16 DIAGNOSIS — K5900 Constipation, unspecified: Secondary | ICD-10-CM

## 2018-10-16 DIAGNOSIS — M6281 Muscle weakness (generalized): Secondary | ICD-10-CM | POA: Diagnosis not present

## 2018-10-16 DIAGNOSIS — N393 Stress incontinence (female) (male): Secondary | ICD-10-CM

## 2018-10-16 DIAGNOSIS — R278 Other lack of coordination: Secondary | ICD-10-CM

## 2018-10-16 NOTE — Patient Instructions (Addendum)
Cough: Phase 3 (Sitting)    Squeeze pelvic floor and hold. Inhale. Lean shoulders forward. Cough. Relax. Repeat _5__ times. Do __1_ times a day.  Copyright  VHI. All rights reserved.  Slow Contraction: Gravity Resisted (Sitting)    Sitting, slowly squeeze pelvic floor for _10__ seconds. Rest for _10__ seconds. Repeat _10__ times. Do _2__ times a day.  Copyright  VHI. All rights reserved.  [facility] [address], [place], [place] [address] Phone # [phone] Fax [phone]

## 2018-10-16 NOTE — Therapy (Signed)
[facility] [address] [place], [place], [address] Phone: [phone]   Fax:  [phone]  Physical Therapy Treatment  Patient Details  Name: Gabrielle Benson MRN: [id number] Date of Birth: Sep 12, 1955 Referring Provider (PT): Dr. Silvano Rusk   Encounter Date: 10/16/2018  PT End of Session - 10/16/18 0739    Visit Number  6    Date for PT Re-Evaluation  12/05/18    Authorization Type  UHC    PT Start Time  0730    PT Stop Time  0810    PT Time Calculation (min)  40 min    Activity Tolerance  Patient tolerated treatment well;No increased pain    Behavior During Therapy  WFL for tasks assessed/performed       Past Medical History:  Diagnosis Date  . Allergic rhinitis   . ANA positive   . Anemia    Iron deficiency  . Anxiety   . Cervicalgia    Chronic  . Complication of anesthesia    hard to go to sleep with Anesthesia-had to have Benadryl with Colonoscopy  . DDD (degenerative disc disease)    Cervical  . DJD (degenerative joint disease)   . Fibromyalgia   . GERD (gastroesophageal reflux disease)   . Hyperlipidemia   . Multinodular thyroid   . Neuropathy, peripheral   . Pneumonia   . Vitamin D deficiency 03/18/2011    Past Surgical History:  Procedure Laterality Date  . ABDOMINAL HYSTERECTOMY    . ABDOMINAL HYSTERECTOMY     partial  . BUNIONECTOMY  2011   left foot  . DILATION AND CURETTAGE OF UTERUS    . KNEE ARTHROSCOPY  2004   right  . MYOMECTOMY  2002   prior to hysterectomy  . PARTIAL KNEE ARTHROPLASTY Right 10/07/2014   Procedure: RIGHT UNI KNEE ARTHROPLASTY LATERALLY ;  Surgeon: Paralee Cancel, MD;  Location: [facility];  Service: Orthopedics;  Laterality: Right;  . TONSILLECTOMY     age 63  . TOTAL SHOULDER ARTHROPLASTY Left 04/28/2017    There were no vitals filed for this visit.  Subjective Assessment - 10/16/18 0742    Subjective  Only missed one day having a bowel movement.  Stomach is not as tight. I am not straining.    Patient Stated Goals  learn exercises to control the urine and pelvic floor    Currently in Pain?  Yes    Pain Score  4     Pain Location  Abdomen    Pain Orientation  Lower    Pain Descriptors / Indicators  Discomfort;Sore    Pain Type  Chronic pain    Pain Onset  More than a month ago    Pain Frequency  Intermittent    Aggravating Factors   when no BM or press on the adomen    Pain Relieving Factors  BM    Multiple Pain Sites  No                    Pelvic Floor Special Questions - 10/16/18 0001    Pelvic Floor Internal Exam  Patient comfirms identification and approves PT to assess pelvic floor and treatment    Exam Type  Vaginal    Palpation  tightness on the left pelvic floor    Strength  good squeeze, good lift, able to hold agaisnt strong resistance        [facility] Adult PT Treatment/Exercise - 10/16/18 0001  Exercises   Exercises  Other Exercises    Other Exercises   sitting pelvic floor contraction with cough, sitting holding for 10 second 10x      Lumbar Exercises: Aerobic   Nustep  seat # 7 and arm # 10; level 4; for 5 min while assessing patient      Lumbar Exercises: Supine   Other Supine Lumbar Exercises  pelvic floor contraction in supine with tactile cues to pull up to strength of 4/5      Manual Therapy   Manual Therapy  Internal Pelvic Floor    Internal Pelvic Floor  left levator ani, obturator internist, bil. sides of the introitus             PT Education - 10/16/18 0807    Education Details  pelvic floor contraction with cough, sitting and holding the contraction for 10 seconds    Person(s) Educated  Patient    Methods  Explanation;Demonstration;Verbal cues;Handout    Comprehension  Returned demonstration;Verbalized understanding       PT Short Term Goals - 10/02/18 0820      PT SHORT TERM GOAL #1   Title  independent with intial HEP    Time  4    Period  Weeks    Status   Achieved    Target Date  10/10/18      PT SHORT TERM GOAL #2   Title  understand correct toileting technique to relax the pelvic floor with correct breathing    Time  4    Period  Weeks    Status  Achieved    Target Date  10/10/18      PT SHORT TERM GOAL #3   Title  pelvic floor strength is 2/5 so she is able to contract with a circular contraction    Time  4    Period  Weeks    Status  Achieved    Target Date  10/10/18      PT SHORT TERM GOAL #4   Title  understand about bowel health    Time  4    Period  Weeks    Status  Achieved    Target Date  10/10/18        PT Long Term Goals - 10/16/18 0746      PT LONG TERM GOAL #2   Title  able to have a full bowel movement that is Type 3-5 due to improve mechanics    Baseline  type 6, taking less Linzess    Time  12    Period  Weeks    Status  On-going      PT LONG TERM GOAL #3   Title  pelvic floor strength >/= 3/5 so she does not have to wear a pad and no leakage with coughing and sneezing    Time  12    Period  Weeks    Status  Achieved            Plan - 10/16/18 0745    Clinical Impression Statement  Patient has increased strength of pelvic floor to 4/5. Patient is able to feel the lower abdominals contract with her pelvic floor contraction. Patient has not leaked urine since last visit. Patient missed one day to have a bowel movement. Patient still has tightness in the left side of the pelvic floor. Patient is not straining with bowel movements. Patient will benefit from skilled therapy to improve pelvic floor coordination to have a bowel movement and  reduce urinary leakage.    Personal Factors and Comorbidities  Age;Fitness;Comorbidity 1;Comorbidity 2;Sex;Time since onset of injury/illness/exacerbation    Comorbidities  Fibromyalgia; chronic neck and back pain    Examination-Activity Limitations  Continence;Toileting    Stability/Clinical Decision Making  Evolving/Moderate complexity    Rehab Potential  Good     PT Frequency  1x / week    PT Duration  12 weeks    PT Treatment/Interventions  Biofeedback;Cryotherapy;Electrical Stimulation;Moist Heat;Therapeutic exercise;Therapeutic activities;Neuromuscular re-education;Patient/family education;Dry needling;Manual techniques    PT Next Visit Plan  if patient is not doing well then discharge. progress HEP    PT Home Exercise Plan  Access Code: [email or web address]    Consulted and Agree with Plan of Care  Patient       Patient will benefit from skilled therapeutic intervention in order to improve the following deficits and impairments:  Decreased coordination, Increased muscle spasms, Pain, Decreased strength  Visit Diagnosis: Muscle weakness (generalized)  Other lack of coordination  Stress incontinence (female) (female)  Constipation, unspecified constipation type     Problem List Patient Active Problem List   Diagnosis Date Noted  . Acute upper respiratory infection 09/30/2017  . Hyperglycemia 10/14/2016  . DJD (degenerative joint disease), cervical 10/05/2016  . DDD (degenerative disc disease), lumbar 10/05/2016  . Primary osteoarthritis of left knee 10/05/2016  . Acute left-sided low back pain 03/25/2016  . Pain, joint, multiple sites 03/25/2016  . History of insomnia 03/25/2016  . Greater trochanteric bursitis of both hips 03/25/2016  . Wheezing 02/18/2016  . Chronic pain syndrome 10/11/2015  . Memory dysfunction 04/01/2015  . Status post right partial knee replacement 10/07/2014  . Cough 11/02/2013  . Herpes labialis 11/02/2013  . GERD (gastroesophageal reflux disease) 11/02/2012  . Anxiety 07/01/2011  . Vitamin D deficiency 03/18/2011  . Preventative health care 03/13/2011  . PERIPHERAL NEUROPATHY 10/03/2007  . Hyperlipidemia 01/05/2007  . ANEMIA-IRON DEFICIENCY 01/05/2007  . INSOMNIA-SLEEP DISORDER-UNSPEC 01/05/2007  . Allergic rhinitis 01/05/2007  . Irritable bowel syndrome with constipation 01/05/2007  . Fibromyalgia syndrome  01/05/2007    Earlie Counts, PT 10/16/18 8:13 AM   [facility] [address] [place], [place], [address] Phone: [phone]   Fax:  [phone]  Name: Gabrielle Benson MRN: [id number] Date of Birth: 23-Dec-1955

## 2018-10-20 ENCOUNTER — Other Ambulatory Visit (INDEPENDENT_AMBULATORY_CARE_PROVIDER_SITE_OTHER): Payer: 59

## 2018-10-20 ENCOUNTER — Other Ambulatory Visit: Payer: Self-pay

## 2018-10-20 ENCOUNTER — Encounter: Payer: Self-pay | Admitting: Internal Medicine

## 2018-10-20 ENCOUNTER — Ambulatory Visit (INDEPENDENT_AMBULATORY_CARE_PROVIDER_SITE_OTHER): Payer: 59 | Admitting: Internal Medicine

## 2018-10-20 VITALS — BP 136/88 | HR 85 | Temp 98.5°F | Ht 63.5 in | Wt 185.0 lb

## 2018-10-20 DIAGNOSIS — E559 Vitamin D deficiency, unspecified: Secondary | ICD-10-CM

## 2018-10-20 DIAGNOSIS — D509 Iron deficiency anemia, unspecified: Secondary | ICD-10-CM

## 2018-10-20 DIAGNOSIS — E785 Hyperlipidemia, unspecified: Secondary | ICD-10-CM | POA: Diagnosis not present

## 2018-10-20 DIAGNOSIS — E538 Deficiency of other specified B group vitamins: Secondary | ICD-10-CM | POA: Diagnosis not present

## 2018-10-20 DIAGNOSIS — R739 Hyperglycemia, unspecified: Secondary | ICD-10-CM | POA: Diagnosis not present

## 2018-10-20 DIAGNOSIS — Z0000 Encounter for general adult medical examination without abnormal findings: Secondary | ICD-10-CM

## 2018-10-20 LAB — IBC PANEL
Iron: 81 ug/dL (ref 42–145)
Saturation Ratios: 24.4 % (ref 20.0–50.0)
Transferrin: 237 mg/dL (ref 212.0–360.0)

## 2018-10-20 LAB — LIPID PANEL
Cholesterol: 247 mg/dL — ABNORMAL HIGH (ref 0–200)
HDL: 73.4 mg/dL (ref >39.00)
LDL Cholesterol: 158 mg/dL — ABNORMAL HIGH (ref 0–99)
NonHDL: 173.26
Total CHOL/HDL Ratio: 3
Triglycerides: 74 mg/dL (ref 0.0–149.0)
VLDL: 14.8 mg/dL (ref 0.0–40.0)

## 2018-10-20 LAB — URINALYSIS, ROUTINE W REFLEX MICROSCOPIC
Bilirubin Urine: NEGATIVE
Hgb urine dipstick: NEGATIVE
Ketones, ur: NEGATIVE
Leukocytes,Ua: NEGATIVE
Nitrite: NEGATIVE
RBC / HPF: NONE SEEN (ref 0–?)
Specific Gravity, Urine: 1.025 (ref 1.000–1.030)
Total Protein, Urine: NEGATIVE
Urine Glucose: NEGATIVE
Urobilinogen, UA: 0.2 (ref 0.0–1.0)
pH: 6 (ref 5.0–8.0)

## 2018-10-20 LAB — CBC WITH DIFFERENTIAL/PLATELET
Basophils Absolute: 0 10*3/uL (ref 0.0–0.1)
Basophils Relative: 0.2 % (ref 0.0–3.0)
Eosinophils Absolute: 0 10*3/uL (ref 0.0–0.7)
Eosinophils Relative: 0.8 % (ref 0.0–5.0)
HCT: 37.8 % (ref 36.0–46.0)
Hemoglobin: 12.1 g/dL (ref 12.0–15.0)
Lymphocytes Relative: 31.7 % (ref 12.0–46.0)
Lymphs Abs: 1.4 10*3/uL (ref 0.7–4.0)
MCHC: 32.1 g/dL (ref 30.0–36.0)
MCV: 83.2 fl (ref 78.0–100.0)
Monocytes Absolute: 0.2 10*3/uL (ref 0.1–1.0)
Monocytes Relative: 5.2 % (ref 3.0–12.0)
Neutro Abs: 2.7 10*3/uL (ref 1.4–7.7)
Neutrophils Relative %: 62.1 % (ref 43.0–77.0)
Platelets: 363 10*3/uL (ref 150.0–400.0)
RBC: 4.55 Mil/uL (ref 3.87–5.11)
RDW: 13.4 % (ref 11.5–15.5)
WBC: 4.4 10*3/uL (ref 4.0–10.5)

## 2018-10-20 LAB — BASIC METABOLIC PANEL
BUN: 10 mg/dL (ref 6–23)
CO2: 31 mEq/L (ref 19–32)
Calcium: 11.4 mg/dL — ABNORMAL HIGH (ref 8.4–10.5)
Chloride: 102 mEq/L (ref 96–112)
Creatinine, Ser: 0.66 mg/dL (ref 0.40–1.20)
GFR: 110.05 mL/min (ref >60.00)
Glucose, Bld: 101 mg/dL — ABNORMAL HIGH (ref 70–99)
Potassium: 3.9 mEq/L (ref 3.5–5.1)
Sodium: 140 mEq/L (ref 135–145)

## 2018-10-20 LAB — HEPATIC FUNCTION PANEL
ALT: 16 U/L (ref 0–35)
AST: 19 U/L (ref 0–37)
Albumin: 4.3 g/dL (ref 3.5–5.2)
Alkaline Phosphatase: 110 U/L (ref 39–117)
Bilirubin, Direct: 0.1 mg/dL (ref 0.0–0.3)
Total Bilirubin: 0.3 mg/dL (ref 0.2–1.2)
Total Protein: 7.8 g/dL (ref 6.0–8.3)

## 2018-10-20 LAB — FERRITIN: Ferritin: 90.4 ng/mL (ref 10.0–291.0)

## 2018-10-20 LAB — HEMOGLOBIN A1C: Hgb A1c MFr Bld: 6.2 % (ref 4.6–6.5)

## 2018-10-20 LAB — VITAMIN B12: Vitamin B-12: 322 pg/mL (ref 211–911)

## 2018-10-20 LAB — VITAMIN D 25 HYDROXY (VIT D DEFICIENCY, FRACTURES): VITD: 18.22 ng/mL — ABNORMAL LOW (ref 30.00–100.00)

## 2018-10-20 LAB — TSH: TSH: 0.62 u[IU]/mL (ref 0.35–4.50)

## 2018-10-20 MED ORDER — PANTOPRAZOLE SODIUM 40 MG PO TBEC: 40.0000 mg | DELAYED_RELEASE_TABLET | Freq: Every day | ORAL | 3 refills | Status: DC

## 2018-10-20 NOTE — Assessment & Plan Note (Signed)

## 2018-10-20 NOTE — Progress Notes (Signed)
Subjective:    Patient ID: Gabrielle Benson, female    DOB: 11-Oct-1955, 63 y.o.   MRN: [id number]  HPI  Here for wellness and f/u;  Overall doing ok;  Pt denies Chest pain, worsening SOB, DOE, wheezing, orthopnea, PND, worsening LE edema, palpitations, dizziness or syncope.  Pt denies neurological change such as new headache, facial or extremity weakness.  Pt denies polydipsia, polyuria, or low sugar symptoms. Pt states overall good compliance with treatment and medications, good tolerability, and has been trying to follow appropriate diet.  Pt denies worsening depressive symptoms, suicidal ideation or panic. No fever, night sweats, wt loss, loss of appetite, or other constitutional symptoms.  Pt states good ability with ADL's, has low fall risk, home safety reviewed and adequate, no other significant changes in hearing or vision, and occasionally active with exercise, especially with pelvic exercises now much improved per GI.   Wt Readings from Last 3 Encounters:  10/20/18 185 lb (83.9 kg)  08/31/18 188 lb (85.3 kg)  08/21/18 188 lb (85.3 kg)   Past Medical History:  Diagnosis Date   Allergic rhinitis    ANA positive    Anemia    Iron deficiency   Anxiety    Cervicalgia    Chronic   Complication of anesthesia    hard to go to sleep with Anesthesia-had to have Benadryl with Colonoscopy   DDD (degenerative disc disease)    Cervical   DJD (degenerative joint disease)    Fibromyalgia    GERD (gastroesophageal reflux disease)    Hyperlipidemia    Multinodular thyroid    Neuropathy, peripheral    Pneumonia    Vitamin D deficiency 03/18/2011   Past Surgical History:  Procedure Laterality Date   ABDOMINAL HYSTERECTOMY     ABDOMINAL HYSTERECTOMY     partial   BUNIONECTOMY  2011   left foot   DILATION AND CURETTAGE OF UTERUS     KNEE ARTHROSCOPY  2004   right   MYOMECTOMY  2002   prior to hysterectomy   PARTIAL KNEE ARTHROPLASTY Right 10/07/2014   Procedure: RIGHT UNI KNEE ARTHROPLASTY LATERALLY ;  Surgeon: Paralee Cancel, MD;  Location: [facility];  Service: Orthopedics;  Laterality: Right;   TONSILLECTOMY     age 77   TOTAL SHOULDER ARTHROPLASTY Left 04/28/2017    reports that she has never smoked. She has never used smokeless tobacco. She reports current alcohol use of about 3.0 - 4.0 standard drinks of alcohol per week. She reports that she does not use drugs. family history includes Colon cancer in an other family member; Diabetes in her mother; Healthy in her daughter and son; Heart disease in her father; Lung cancer in an other family member; Sickle cell trait in an other family member. Allergies  Allergen Reactions   Ampicillin    Duloxetine     REACTION: dizzy, nervous   Penicillins     Has patient had a PCN reaction causing immediate rash, facial/tongue/throat swelling, SOB or lightheadedness with hypotension: No Has patient had a PCN reaction causing severe rash involving mucus membranes or skin necrosis: No Has patient had a PCN reaction that required hospitalization: No Has patient had a PCN reaction occurring within the last 10 years: No If all of the above answers are "NO", then may proceed with Cephalosporin use.     Current Outpatient Medications on File Prior to Visit  Medication Sig Dispense Refill   azelastine (ASTELIN) 0.1 % nasal spray Place into both nostrils 2 (  two) times daily. Use in each nostril as directed     azelastine (OPTIVAR) 0.05 % ophthalmic solution 1 drop 2 (two) times daily.     diclofenac sodium (VOLTAREN) 1 % GEL Apply 2-4 grams to affected joint up to 4 times a day as needed 4 Tube 2   estrogens, conjugated, (PREMARIN) 0.3 MG tablet Take 0.3 mg by mouth daily. Take daily for 21 days then do not take for 7 days.     levocetirizine (XYZAL) 5 MG tablet Take 5 mg by mouth every evening.     linaclotide (LINZESS) 290 MCG CAPS capsule Take 1 capsule (290 mcg total) by mouth daily before  breakfast. 90 capsule 3   Menthol, Topical Analgesic, (BIOFREEZE EX) Apply 1 application topically daily as needed (joint pain).      methocarbamol (ROBAXIN) 500 MG tablet TAKE 1 TABLET MOUTH 2 (TWO) TIMES DAILY AS NEEDED. 180 tablet 0   naproxen sodium (ALEVE) 220 MG tablet Take 220 mg by mouth as needed.     oxyCODONE-acetaminophen (PERCOCET) 10-325 MG tablet Take 1 tablet by mouth 3 (three) times daily as needed for severe pain.     pregabalin (LYRICA) 150 MG capsule TAKE 1 CAPSULE (150 MG TOTAL) BY MOUTH 3 (THREE) TIMES DAILY. 270 capsule 0   Current Facility-Administered Medications on File Prior to Visit  Medication Dose Route Frequency Provider Last Rate Last Dose   0.9 %  sodium chloride infusion  500 mL Intravenous Once Gatha Mayer, MD       Review of Systems Constitutional: Negative for other unusual diaphoresis, sweats, appetite or weight changes HENT: Negative for other worsening hearing loss, ear pain, facial swelling, mouth sores or neck stiffness.   Eyes: Negative for other worsening pain, redness or other visual disturbance.  Respiratory: Negative for other stridor or swelling Cardiovascular: Negative for other palpitations or other chest pain  Gastrointestinal: Negative for worsening diarrhea or loose stools, blood in stool, distention or other pain Genitourinary: Negative for hematuria, flank pain or other change in urine volume.  Musculoskeletal: Negative for myalgias or other joint swelling.  Skin: Negative for other color change, or other wound or worsening drainage.  Neurological: Negative for other syncope or numbness. Hematological: Negative for other adenopathy or swelling Psychiatric/Behavioral: Negative for hallucinations, other worsening agitation, SI, self-injury, or new decreased concentration All otherwise neg per pt    Objective:   Physical Exam BP 136/88    Pulse 85    Temp 98.5 F (36.9 C) (Oral)    Ht 5' 3.5" (1.613 m)    Wt 185 lb (83.9 kg)     SpO2 98%    BMI 32.26 kg/m  VS noted,  Constitutional: Pt is oriented to person, place, and time. Appears well-developed and well-nourished, in no significant distress and comfortable Head: Normocephalic and atraumatic  Eyes: Conjunctivae and EOM are normal. Pupils are equal, round, and reactive to light Right Ear: External ear normal without discharge Left Ear: External ear normal without discharge Nose: Nose without discharge or deformity Mouth/Throat: Oropharynx is without other ulcerations and moist  Neck: Normal range of motion. Neck supple. No JVD present. No tracheal deviation present or significant neck LA or mass Cardiovascular: Normal rate, regular rhythm, normal heart sounds and intact distal pulses.   Pulmonary/Chest: WOB normal and breath sounds without rales or wheezing  Abdominal: Soft. Bowel sounds are normal. NT. No HSM  Musculoskeletal: Normal range of motion. Exhibits no edema Lymphadenopathy: Has no other cervical adenopathy.  Neurological:  Pt is alert and oriented to person, place, and time. Pt has normal reflexes. No cranial nerve deficit. Motor grossly intact, Gait intact Skin: Skin is warm and dry. No rash noted or new ulcerations Psychiatric:  Has normal mood and affect. Behavior is normal without agitation All otherwise neg per pt Lab Results  Component Value Date   WBC 4.4 10/18/2017   HGB 11.5 (L) 10/18/2017   HCT 35.2 (L) 10/18/2017   PLT 344.0 10/18/2017   GLUCOSE 90 10/18/2017   CHOL 218 (H) 10/18/2017   TRIG 65.0 10/18/2017   HDL 78.40 10/18/2017   LDLDIRECT 122.7 07/01/2011   LDLCALC 126 (H) 10/18/2017   ALT 10 10/18/2017   AST 13 10/18/2017   NA 139 10/18/2017   K 4.1 10/18/2017   CL 102 10/18/2017   CREATININE 0.63 10/18/2017   BUN 8 10/18/2017   CO2 32 10/18/2017   TSH 2.15 10/18/2017   INR 0.94 10/01/2014   HGBA1C 5.7 10/18/2017        Assessment & Plan:

## 2018-10-20 NOTE — Assessment & Plan Note (Signed)
Prefers diet

## 2018-10-20 NOTE — Assessment & Plan Note (Signed)
Also for lab f/u today

## 2018-10-20 NOTE — Patient Instructions (Signed)

## 2018-10-20 NOTE — Assessment & Plan Note (Signed)
stable overall by history and exam, recent data reviewed with pt, and pt to continue medical treatment as before,  to f/u any worsening symptoms or concerns  

## 2018-10-21 ENCOUNTER — Other Ambulatory Visit: Payer: Self-pay | Admitting: Internal Medicine

## 2018-10-21 MED ORDER — ATORVASTATIN CALCIUM 20 MG PO TABS: 20.0000 mg | ORAL_TABLET | Freq: Every day | ORAL | 3 refills | Status: DC

## 2018-10-21 MED ORDER — VITAMIN D (ERGOCALCIFEROL) 1.25 MG (50000 UNIT) PO CAPS: 50000.0000 [IU] | ORAL_CAPSULE | ORAL | 0 refills | Status: DC

## 2018-10-25 ENCOUNTER — Other Ambulatory Visit: Payer: 59

## 2018-10-27 LAB — PTH, INTACT AND CALCIUM
Calcium: 10.2 mg/dL (ref 8.6–10.4)
PTH: 60 pg/mL (ref 14–64)

## 2018-11-03 ENCOUNTER — Encounter: Payer: Self-pay | Admitting: Physical Therapy

## 2018-11-03 ENCOUNTER — Ambulatory Visit: Payer: 59 | Attending: Internal Medicine | Admitting: Physical Therapy

## 2018-11-03 ENCOUNTER — Other Ambulatory Visit: Payer: Self-pay

## 2018-11-03 DIAGNOSIS — N393 Stress incontinence (female) (male): Secondary | ICD-10-CM | POA: Diagnosis present

## 2018-11-03 DIAGNOSIS — M6281 Muscle weakness (generalized): Secondary | ICD-10-CM

## 2018-11-03 DIAGNOSIS — R278 Other lack of coordination: Secondary | ICD-10-CM

## 2018-11-03 DIAGNOSIS — K5900 Constipation, unspecified: Secondary | ICD-10-CM | POA: Diagnosis present

## 2018-11-03 NOTE — Therapy (Signed)
[facility] [address], [place], [place], [address] Phone: [phone]   Fax:  [phone]  Physical Therapy Treatment  Patient Details  Name: Gabrielle Benson MRN: [id number] Date of Birth: 61-Nov-1957 Referring Provider (PT): Dr. Silvano Rusk   Encounter Date: 11/03/2018  PT End of Session - 11/03/18 0808    Visit Number  7    Date for PT Re-Evaluation  12/05/18    Authorization Type  [employer]    PT Start Time  0808   came late   PT Stop Time  0840    PT Time Calculation (min)  32 min    Activity Tolerance  Patient tolerated treatment well;No increased pain    Behavior During Therapy  WFL for tasks assessed/performed       Past Medical History:  Diagnosis Date  . Allergic rhinitis   . ANA positive   . Anemia    Iron deficiency  . Anxiety   . Cervicalgia    Chronic  . Complication of anesthesia    hard to go to sleep with Anesthesia-had to have Benadryl with Colonoscopy  . DDD (degenerative disc disease)    Cervical  . DJD (degenerative joint disease)   . Fibromyalgia   . GERD (gastroesophageal reflux disease)   . Hyperlipidemia   . Multinodular thyroid   . Neuropathy, peripheral   . Pneumonia   . Vitamin D deficiency 03/18/2011    Past Surgical History:  Procedure Laterality Date  . ABDOMINAL HYSTERECTOMY    . ABDOMINAL HYSTERECTOMY     partial  . BUNIONECTOMY  2011   left foot  . DILATION AND CURETTAGE OF UTERUS    . KNEE ARTHROSCOPY  2004   right  . MYOMECTOMY  2002   prior to hysterectomy  . PARTIAL KNEE ARTHROPLASTY Right 10/07/2014   Procedure: RIGHT UNI KNEE ARTHROPLASTY LATERALLY ;  Surgeon: Paralee Cancel, MD;  Location: [facility];  Service: Orthopedics;  Laterality: Right;  . TONSILLECTOMY     age 63  . TOTAL SHOULDER ARTHROPLASTY Left 04/28/2017    There were no vitals filed for this visit.  Subjective Assessment - 11/03/18 0808    Subjective  I have gone several days without  a  bowel movement. I have been drinking more water. I am going to workout at home. I have lost some weight. Abdominal pain is 70% better.    Patient Stated Goals  learn exercises to control the urine and pelvic floor    Currently in Pain?  No/denies         [facility] PT Assessment - 11/03/18 0001      Assessment   Medical Diagnosis  N393.3 Urinary incontinence, stress female; K59.00 Constipation, unspecified constipation, unspecified constipation type    Referring Provider (PT)  Dr. Silvano Rusk    Onset Date/Surgical Date  --   10 years   Prior Therapy  none      Precautions   Precautions  None      Restrictions   Weight Bearing Restrictions  No      Prior Function   Level of Independence  Independent    Vocation  Full time employment    Vocation Requirements  sitting      Cognition   Overall Cognitive Status  Within Functional Limits for tasks assessed      Posture/Postural Control   Posture/Postural Control  No significant limitations      Strength   Right Hip ABduction  4+/5  Right Hip ADduction  4+/5    Left Hip ABduction  4+/5                Pelvic Floor Special Questions - 11/03/18 0001    Urinary Leakage  No    Fecal incontinence  No    Pelvic Floor Internal Exam  Patient comfirms identification and approves PT to assess pelvic floor and treatment    Exam Type  Vaginal    Palpation  tightness on the left pelvic floor    Strength  good squeeze, good lift, able to hold agaisnt strong resistance        [facility] Adult PT Treatment/Exercise - 11/03/18 0001      Manual Therapy   Manual Therapy  Myofascial release;Soft tissue mobilization    Soft tissue mobilization  diaphragm and along the lower abdominal scar to elongate the tissue    Myofascial Release  release of the upper abdomen to improve digestion and movement of stool               PT Short Term Goals - 10/02/18 0820      PT SHORT TERM GOAL #1   Title  independent with intial HEP     Time  4    Period  Weeks    Status  Achieved    Target Date  10/10/18      PT SHORT TERM GOAL #2   Title  understand correct toileting technique to relax the pelvic floor with correct breathing    Time  4    Period  Weeks    Status  Achieved    Target Date  10/10/18      PT SHORT TERM GOAL #3   Title  pelvic floor strength is 2/5 so she is able to contract with a circular contraction    Time  4    Period  Weeks    Status  Achieved    Target Date  10/10/18      PT SHORT TERM GOAL #4   Title  understand about bowel health    Time  4    Period  Weeks    Status  Achieved    Target Date  10/10/18        PT Long Term Goals - 11/03/18 0814      PT LONG TERM GOAL #1   Title  independent with HEP and understand how to progress herself    Time  12    Period  Weeks    Status  Achieved      PT LONG TERM GOAL #2   Title  able to have a full bowel movement that is Type 3-5 due to improve mechanics    Time  12    Period  Weeks    Status  Achieved      PT LONG TERM GOAL #3   Title  pelvic floor strength >/= 3/5 so she does not have to wear a pad and no leakage with coughing and sneezing    Time  12    Period  Weeks    Status  Achieved      PT LONG TERM GOAL #4   Title  pressure feeling in the abdomen improves >/= 50% due to having regular bowel movements    Time  12    Period  Weeks    Status  Achieved            Plan - 11/03/18 0819      Clinical Impression Statement  Patient has met all of her goals. The pelvic floor strength is 4/5. Patient reports no urinary leakage. Patient is able to have a bowel movement every 2-3 days if she is doing the abdominal massage, drinking enough water, and eat fiber. Patient stools are type 4-6. Patient is independent with her HEP. Patient is doing well with abdominal pain. Patient has increased hip strength.    Personal Factors and Comorbidities  Age;Fitness;Comorbidity 1;Comorbidity 2;Sex;Time since onset of  injury/illness/exacerbation    Comorbidities  Fibromyalgia; chronic neck and back pain    Examination-Activity Limitations  Continence;Toileting    Stability/Clinical Decision Making  Evolving/Moderate complexity    Rehab Potential  Good    PT Treatment/Interventions  Biofeedback;Cryotherapy;Electrical Stimulation;Moist Heat;Therapeutic exercise;Therapeutic activities;Neuromuscular re-education;Patient/family education;Dry needling;Manual techniques    PT Next Visit Plan  Discharge to HEP this visit    PT Home Exercise Plan  Access Code: [email or web address]    Consulted and Agree with Plan of Care  Patient       Patient will benefit from skilled therapeutic intervention in order to improve the following deficits and impairments:  Decreased coordination, Increased muscle spasms, Pain, Decreased strength  Visit Diagnosis: Muscle weakness (generalized)  Other lack of coordination  Stress incontinence (female) (female)  Constipation, unspecified constipation type     Problem List Patient Active Problem List   Diagnosis Date Noted  . Acute upper respiratory infection 09/30/2017  . Hyperglycemia 10/14/2016  . DJD (degenerative joint disease), cervical 10/05/2016  . DDD (degenerative disc disease), lumbar 10/05/2016  . Primary osteoarthritis of left knee 10/05/2016  . Acute left-sided low back pain 03/25/2016  . Pain, joint, multiple sites 03/25/2016  . History of insomnia 03/25/2016  . Greater trochanteric bursitis of both hips 03/25/2016  . Wheezing 02/18/2016  . Chronic pain syndrome 10/11/2015  . Memory dysfunction 04/01/2015  . Status post right partial knee replacement 10/07/2014  . Cough 11/02/2013  . Herpes labialis 11/02/2013  . GERD (gastroesophageal reflux disease) 11/02/2012  . Anxiety 07/01/2011  . Vitamin D deficiency 03/18/2011  . Preventative health care 03/13/2011  . PERIPHERAL NEUROPATHY 10/03/2007  . Hyperlipidemia 01/05/2007  . ANEMIA-IRON DEFICIENCY 01/05/2007  .  INSOMNIA-SLEEP DISORDER-UNSPEC 01/05/2007  . Allergic rhinitis 01/05/2007  . Irritable bowel syndrome with constipation 01/05/2007  . Fibromyalgia syndrome 01/05/2007    Earlie Counts, PT 11/03/18 8:47 AM   [facility] [address], [place], [place], [address] Phone: [phone]   Fax:  [phone]  Name: Gabrielle Benson MRN: [id number] Date of Birth: June 28, 1955  PHYSICAL THERAPY DISCHARGE SUMMARY  Visits from Start of Care: 7  Current functional level related to goals / functional outcomes: See above   Remaining deficits: See above.   Education / Equipment: HEP Plan: Patient agrees to discharge.  Patient goals were met. Patient is being discharged due to meeting the stated rehab goals.  Thank you for the referral. Earlie Counts, PT 11/03/18 8:48 AM  ?????

## 2019-01-03 ENCOUNTER — Ambulatory Visit: Payer: 59 | Attending: Internal Medicine

## 2019-01-03 ENCOUNTER — Other Ambulatory Visit: Payer: Self-pay

## 2019-01-03 DIAGNOSIS — Z20822 Contact with and (suspected) exposure to covid-19: Secondary | ICD-10-CM

## 2019-01-05 LAB — NOVEL CORONAVIRUS, NAA: SARS-CoV-2, NAA: NOT DETECTED

## 2019-01-22 ENCOUNTER — Other Ambulatory Visit: Payer: Self-pay | Admitting: Physician Assistant

## 2019-01-22 ENCOUNTER — Other Ambulatory Visit: Payer: Self-pay | Admitting: Rheumatology

## 2019-01-23 NOTE — Telephone Encounter (Signed)
Last Visit: 08/31/18 Next Visit: 03/01/19  Okay to refillper Dr. Estanislado Pandy

## 2019-01-23 NOTE — Telephone Encounter (Signed)
Last Visit: 08/31/18 Next Visit: 03/01/19  Okay to refill Lyrica?

## 2019-02-27 NOTE — Progress Notes (Deleted)
Office Visit Note  Patient: Gabrielle Benson             Date of Birth: April 07, 1955           MRN: [id number]             PCP: [name], MD Referring: [name], MD Visit Date: 03/01/2019 Occupation: @GUAROCC @  Subjective:  No chief complaint on file.   History of Present Illness: Gabrielle Benson is a 64 y.o. female ***   Activities of Daily Living:  Patient reports morning stiffness for *** {minute/hour:19697}.   Patient {ACTIONS;DENIES/REPORTS:21021675::"Denies"} nocturnal pain.  Difficulty dressing/grooming: {ACTIONS;DENIES/REPORTS:21021675::"Denies"} Difficulty climbing stairs: {ACTIONS;DENIES/REPORTS:21021675::"Denies"} Difficulty getting out of chair: {ACTIONS;DENIES/REPORTS:21021675::"Denies"} Difficulty using hands for taps, buttons, cutlery, and/or writing: {ACTIONS;DENIES/REPORTS:21021675::"Denies"}  No Rheumatology ROS completed.   PMFS History:  Patient Active Problem List   Diagnosis Date Noted  . Acute upper respiratory infection 09/30/2017  . Hyperglycemia 10/14/2016  . DJD (degenerative joint disease), cervical 10/05/2016  . DDD (degenerative disc disease), lumbar 10/05/2016  . Primary osteoarthritis of left knee 10/05/2016  . Acute left-sided low back pain 03/25/2016  . Pain, joint, multiple sites 03/25/2016  . History of insomnia 03/25/2016  . Greater trochanteric bursitis of both hips 03/25/2016  . Wheezing 02/18/2016  . Chronic pain syndrome 10/11/2015  . Memory dysfunction 04/01/2015  . Status post right partial knee replacement 10/07/2014  . Cough 11/02/2013  . Herpes labialis 11/02/2013  . GERD (gastroesophageal reflux disease) 11/02/2012  . Anxiety 07/01/2011  . Vitamin D deficiency 03/18/2011  . Preventative health care 03/13/2011  . PERIPHERAL NEUROPATHY 10/03/2007  . Hyperlipidemia 01/05/2007  . ANEMIA-IRON DEFICIENCY 01/05/2007  . INSOMNIA-SLEEP DISORDER-UNSPEC 01/05/2007  . Allergic rhinitis 01/05/2007  . Irritable  bowel syndrome with constipation 01/05/2007  . Fibromyalgia syndrome 01/05/2007    Past Medical History:  Diagnosis Date  . Allergic rhinitis   . ANA positive   . Anemia    Iron deficiency  . Anxiety   . Cervicalgia    Chronic  . Complication of anesthesia    hard to go to sleep with Anesthesia-had to have Benadryl with Colonoscopy  . DDD (degenerative disc disease)    Cervical  . DJD (degenerative joint disease)   . Fibromyalgia   . GERD (gastroesophageal reflux disease)   . Hyperlipidemia   . Multinodular thyroid   . Neuropathy, peripheral   . Pneumonia   . Vitamin D deficiency 03/18/2011    Family History  Problem Relation Age of Onset  . Heart disease Father   . Sickle cell trait Other   . Lung cancer Other   . Colon cancer Other        Aunt  . Diabetes Mother   . Healthy Daughter   . Healthy Son   . Stomach cancer Neg Hx   . Rectal cancer Neg Hx   . Esophageal cancer Neg Hx    Past Surgical History:  Procedure Laterality Date  . ABDOMINAL HYSTERECTOMY    . ABDOMINAL HYSTERECTOMY     partial  . BUNIONECTOMY  2011   left foot  . DILATION AND CURETTAGE OF UTERUS    . KNEE ARTHROSCOPY  2004   right  . MYOMECTOMY  2002   prior to hysterectomy  . PARTIAL KNEE ARTHROPLASTY Right 10/07/2014   Procedure: RIGHT UNI KNEE ARTHROPLASTY LATERALLY ;  Surgeon: Paralee Cancel, MD;  Location: [facility];  Service: Orthopedics;  Laterality: Right;  . TONSILLECTOMY     age 89  . 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 Influenza Whole 11/22/2006  . Td 01/19/1992, 09/06/2014  . Tdap 07/01/2011     Objective: Vital Signs: There were no vitals taken for this visit.   Physical Exam   Musculoskeletal Exam: ***  CDAI Exam: CDAI Score: -- Patient Global: --; Provider Global: -- Swollen: --; Tender: -- Joint Exam  03/01/2019   No joint exam has been documented for this visit   There is currently no information documented on the homunculus. Go to the Rheumatology activity and complete the homunculus joint exam.  Investigation: No additional findings.  Imaging: No results found.  Recent Labs: Lab Results  Component Value Date   WBC 4.4 10/20/2018   HGB 12.1 10/20/2018   PLT 363.0 10/20/2018   NA 140 10/20/2018   K 3.9 10/20/2018   CL 102 10/20/2018   CO2 31 10/20/2018   GLUCOSE 101 (H) 10/20/2018   BUN 10 10/20/2018   CREATININE 0.66 10/20/2018   BILITOT 0.3 10/20/2018   ALKPHOS 110 10/20/2018   AST 19 10/20/2018   ALT 16 10/20/2018   PROT 7.8 10/20/2018   ALBUMIN 4.3 10/20/2018   CALCIUM 10.2 10/25/2018   GFRAA >60 08/28/2016    Speciality Comments: No specialty comments available.  Procedures:  No procedures performed Allergies: Ampicillin, Duloxetine, and Penicillins   Assessment / Plan:     Visit Diagnoses: No diagnosis found.  Orders: No orders of the defined types were placed in this encounter.  No orders of the defined types were placed in this encounter.   Face-to-face time spent with patient was *** minutes. Greater than 50% of time was spent in counseling and coordination of care.  Follow-Up Instructions: No follow-ups on file.   Ofilia Neas, PA-C  Note - This record has been created using Dragon software.  Chart creation errors have been sought, but may not always  have been located. Such creation errors do not reflect on  the standard of medical care.

## 2019-03-01 ENCOUNTER — Ambulatory Visit: Payer: 59 | Admitting: Rheumatology

## 2019-04-12 NOTE — Progress Notes (Signed)
Office Visit Note  Patient: Gabrielle Benson             Date of Birth: 1955-08-14           MRN: [id number]             PCP: [name], MD Referring: [name], MD Visit Date: 04/19/2019 Occupation: @GUAROCC @  Subjective:  Trochanteric bursitis bilaterally   History of Present Illness: Gabrielle Benson is a 64 y.o. female with history of fibromyalgia, osteoarthritis, and DDD.  She takes Lyrica 150 mg 1 capsule TID, Robaxin 500 mg 1 tablet BID prn for muscle spasms. She uses voltaren gel topically as needed for pain relief. She presents today with trochanteric bursitis bilaterally.  She has been performing stretching exercises regularly.  She had a massage last week, which provided temporary relief.  She requested bilateral trochanteric bursa cortisone injections today.  She has ongoing trapezius muscle tension and tenderness bilaterally.    Activities of Daily Living:  Patient reports morning stiffness for several hours.   Patient Denies nocturnal pain.  Difficulty dressing/grooming: Denies Difficulty climbing stairs: Denies Difficulty getting out of chair: Denies Difficulty using hands for taps, buttons, cutlery, and/or writing: Reports  Review of Systems  Constitutional: Negative for fatigue.  HENT: Negative for mouth sores, mouth dryness and nose dryness.   Eyes: Positive for dryness. Negative for pain, itching and visual disturbance.  Respiratory: Negative for cough, hemoptysis, shortness of breath and difficulty breathing.   Cardiovascular: Negative for chest pain, palpitations, hypertension and swelling in legs/feet.  Gastrointestinal: Positive for constipation. Negative for blood in stool and diarrhea.  Endocrine: Negative for increased urination.  Genitourinary: Negative for difficulty urinating and painful urination.  Musculoskeletal: Positive for arthralgias, joint pain, myalgias, muscle weakness, morning stiffness, muscle tenderness and myalgias. Negative  for joint swelling.  Skin: Positive for rash and redness. Negative for color change, pallor, hair loss, nodules/bumps, skin tightness, ulcers and sensitivity to sunlight.  Allergic/Immunologic: Negative for susceptible to infections.  Neurological: Positive for numbness. Negative for dizziness, headaches, memory loss and weakness.  Hematological: Negative for swollen glands.  Psychiatric/Behavioral: Negative for depressed mood, confusion and sleep disturbance. The patient is not nervous/anxious.     PMFS History:  Patient Active Problem List   Diagnosis Date Noted  . Acute upper respiratory infection 09/30/2017  . Hyperglycemia 10/14/2016  . DJD (degenerative joint disease), cervical 10/05/2016  . DDD (degenerative disc disease), lumbar 10/05/2016  . Primary osteoarthritis of left knee 10/05/2016  . Acute left-sided low back pain 03/25/2016  . Pain, joint, multiple sites 03/25/2016  . History of insomnia 03/25/2016  . Greater trochanteric bursitis of both hips 03/25/2016  . Wheezing 02/18/2016  . Chronic pain syndrome 10/11/2015  . Memory dysfunction 04/01/2015  . Status post right partial knee replacement 10/07/2014  . Cough 11/02/2013  . Herpes labialis 11/02/2013  . GERD (gastroesophageal reflux disease) 11/02/2012  . Anxiety 07/01/2011  . Vitamin D deficiency 03/18/2011  . Preventative health care 03/13/2011  . PERIPHERAL NEUROPATHY 10/03/2007  . Hyperlipidemia 01/05/2007  . ANEMIA-IRON DEFICIENCY 01/05/2007  . INSOMNIA-SLEEP DISORDER-UNSPEC 01/05/2007  . Allergic rhinitis 01/05/2007  . Irritable bowel syndrome with constipation 01/05/2007  . Fibromyalgia syndrome 01/05/2007    Past Medical History:  Diagnosis Date  . Allergic rhinitis   . ANA positive   . Anemia    Iron deficiency  . Anxiety   . Cervicalgia    Chronic  . Complication of anesthesia    hard to go  to sleep with Anesthesia-had to have Benadryl with Colonoscopy  . DDD (degenerative disc disease)     Cervical  . DJD (degenerative joint disease)   . Fibromyalgia   . GERD (gastroesophageal reflux disease)   . Hyperlipidemia   . Multinodular thyroid   . Neuropathy, peripheral   . Pneumonia   . Vitamin D deficiency 03/18/2011    Family History  Problem Relation Age of Onset  . Heart disease Father   . Sickle cell trait Other   . Lung cancer Other   . Colon cancer Other        Aunt  . Diabetes Mother   . Healthy Daughter   . Healthy Son   . Stomach cancer Neg Hx   . Rectal cancer Neg Hx   . Esophageal cancer Neg Hx    Past Surgical History:  Procedure Laterality Date  . ABDOMINAL HYSTERECTOMY    . ABDOMINAL HYSTERECTOMY     partial  . BUNIONECTOMY  2011   left foot  . DILATION AND CURETTAGE OF UTERUS    . KNEE ARTHROSCOPY  2004   right  . MYOMECTOMY  2002   prior to hysterectomy  . PARTIAL KNEE ARTHROPLASTY Right 10/07/2014   Procedure: RIGHT UNI KNEE ARTHROPLASTY LATERALLY ;  Surgeon: Paralee Cancel, MD;  Location: [facility];  Service: Orthopedics;  Laterality: Right;  . TONSILLECTOMY     age 35  . 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 Influenza Whole 11/22/2006  . Td 01/19/1992, 09/06/2014  . Tdap 07/01/2011     Objective: Vital Signs: BP 140/88 (BP Location: Right Arm, Patient Position: Sitting, Cuff Size: Normal)   Pulse 70   Resp 13   Ht 5' 3.5" (1.613 m)   Wt 190 lb (86.2 kg)   BMI 33.13 kg/m    Physical Exam Vitals and nursing note reviewed.  Constitutional:      Appearance: She is well-developed.  HENT:     Head: Normocephalic and atraumatic.  Eyes:     Conjunctiva/sclera: Conjunctivae normal.  Pulmonary:     Effort: Pulmonary effort is normal.  Abdominal:     General: Bowel sounds are normal.     Palpations: Abdomen is soft.  Musculoskeletal:     Cervical back: Normal range of motion.    Lymphadenopathy:     Cervical: No cervical adenopathy.  Skin:    General: Skin is warm and dry.     Capillary Refill: Capillary refill takes less than 2 seconds.     Comments: Petechiae noted on bilateral lower extremities    Neurological:     Mental Status: She is alert and oriented to person, place, and time.  Psychiatric:        Behavior: Behavior normal.      Musculoskeletal Exam: Generalized hyperalgesia and positive tender points.  C-spine, thoracic spine, and lumbar spine good ROM.  Shoulder joints, elbow joints, wrist joints, MCPs, PIPs, DIPs good ROM with no synovitis. Complete fist formation bilaterally. Hip joints good ROM.  Tenderness over trochanteric bursa bilaterally.  Right partial knee replacement has good range of motion with no discomfort.  Left knee has good range of motion with no warmth or effusion.  Ankle joints have good range of motion no tenderness or inflammation.  CDAI Exam: CDAI Score: -- Patient Global: --; Provider Global: -- Swollen: --;  Tender: -- Joint Exam 04/19/2019   No joint exam has been documented for this visit   There is currently no information documented on the homunculus. Go to the Rheumatology activity and complete the homunculus joint exam.  Investigation: No additional findings.  Imaging: No results found.  Recent Labs: Lab Results  Component Value Date   WBC 4.4 10/20/2018   HGB 12.1 10/20/2018   PLT 363.0 10/20/2018   NA 140 10/20/2018   K 3.9 10/20/2018   CL 102 10/20/2018   CO2 31 10/20/2018   GLUCOSE 101 (H) 10/20/2018   BUN 10 10/20/2018   CREATININE 0.66 10/20/2018   BILITOT 0.3 10/20/2018   ALKPHOS 110 10/20/2018   AST 19 10/20/2018   ALT 16 10/20/2018   PROT 7.8 10/20/2018   ALBUMIN 4.3 10/20/2018   CALCIUM 10.2 10/25/2018   GFRAA >60 08/28/2016    Speciality Comments: No specialty comments available.  Procedures:  Large Joint Inj: bilateral greater trochanter on 04/19/2019 8:58 AM Indications:  pain Details: 27 G 1.5 in needle, lateral approach  Arthrogram: No  Medications (Right): 1.5 mL lidocaine 1 %; 40 mg triamcinolone acetonide 40 MG/ML Aspirate (Right): 0 mL Medications (Left): 1.5 mL lidocaine 1 %; 40 mg triamcinolone acetonide 40 MG/ML Aspirate (Left): 0 mL Outcome: tolerated well, no immediate complications Procedure, treatment alternatives, risks and benefits explained, specific risks discussed. Consent was given by the patient. Immediately prior to procedure a time out was called to verify the correct patient, procedure, equipment, support staff and site/side marked as required. Patient was prepped and draped in the usual sterile fashion.     Allergies: Ampicillin, Duloxetine, and Penicillins   Assessment / Plan:     Visit Diagnoses: Fibromyalgia syndrome -She has generalized hyperalgesia and positive tender points on exam.  She has trapezius muscle tension and tenderness bilaterally. She presents today with trochanteric bursitis bilaterally. She requested cortisone injections bilaterally.  Procedure notes completed above.  She continues to take Lyrica 150 mg 1 capsule 3 times daily and Robaxin 500 mg twice daily PRN for muscle spasms.  These medications have been effective at managing her pain.  We discussed the importance of regular exercise and good sleep hygiene.  She will follow up in 6 months.  Other fatigue: Chronic but stable. We discussed the importance of regular exercise and good sleep hygiene.   Primary insomnia: She has been sleeping well at night.  Primary osteoarthritis of left knee: She has good ROM with no discomfort.  No warmth or effusion of the left knee joint noted. She uses voltaren gel topically as needed for pain relief.   Status post right partial knee replacement: Doing well.  She has good ROM with no discomfort.   Greater trochanteric bursitis of both hips: She has tenderness over bilateral trochanteric bursa.  She has been performing  stretching exercises on a regular basis.   She had a massage last week, which provided temporary relief.  She requested cortisone injections today.  She tolerated the procedure well.  Procedure notes completed above.  Aftercare was discussed.    DDD (degenerative disc disease), cervical: She has good ROM with no discomfort.  She has trapezius muscle tension and tenderness bilaterally.   DDD (degenerative disc disease), lumbar: She experiences intermittent discomfort in her lower back.  She has no symptoms of radiculopathy at this time.   History of chronic pain -She takes Lyrica 150 mg 1 capsule by mouth TID.   Other medical conditions are listed as follows:  History of scoliosis  History of neuropathy  History of vitamin D deficiency  History of anxiety  Orders: Orders Placed This Encounter  Procedures  . Large Joint Inj   No orders of the defined types were placed in this encounter.   Face-to-face time spent with patient was 30 minutes. Greater than 50% of time was spent in counseling and coordination of care.  Follow-Up Instructions: Return in about 6 months (around 10/19/2019) for Fibromyalgia, Osteoarthritis, DDD.   Ofilia Neas, PA-C  Note - This record has been created using Dragon software.  Chart creation errors have been sought, but may not always  have been located. Such creation errors do not reflect on  the standard of medical care.

## 2019-04-19 ENCOUNTER — Other Ambulatory Visit: Payer: Self-pay

## 2019-04-19 ENCOUNTER — Ambulatory Visit: Payer: 59 | Admitting: Physician Assistant

## 2019-04-19 ENCOUNTER — Encounter: Payer: Self-pay | Admitting: Physician Assistant

## 2019-04-19 VITALS — BP 140/88 | HR 70 | Resp 13 | Ht 63.5 in | Wt 190.0 lb

## 2019-04-19 DIAGNOSIS — M7062 Trochanteric bursitis, left hip: Secondary | ICD-10-CM

## 2019-04-19 DIAGNOSIS — F5101 Primary insomnia: Secondary | ICD-10-CM | POA: Diagnosis not present

## 2019-04-19 DIAGNOSIS — M797 Fibromyalgia: Secondary | ICD-10-CM

## 2019-04-19 DIAGNOSIS — Z8669 Personal history of other diseases of the nervous system and sense organs: Secondary | ICD-10-CM

## 2019-04-19 DIAGNOSIS — R5383 Other fatigue: Secondary | ICD-10-CM

## 2019-04-19 DIAGNOSIS — M5030 Other cervical disc degeneration, unspecified cervical region: Secondary | ICD-10-CM

## 2019-04-19 DIAGNOSIS — Z87898 Personal history of other specified conditions: Secondary | ICD-10-CM

## 2019-04-19 DIAGNOSIS — M1712 Unilateral primary osteoarthritis, left knee: Secondary | ICD-10-CM | POA: Diagnosis not present

## 2019-04-19 DIAGNOSIS — Z96651 Presence of right artificial knee joint: Secondary | ICD-10-CM

## 2019-04-19 DIAGNOSIS — Z8739 Personal history of other diseases of the musculoskeletal system and connective tissue: Secondary | ICD-10-CM

## 2019-04-19 DIAGNOSIS — Z8639 Personal history of other endocrine, nutritional and metabolic disease: Secondary | ICD-10-CM

## 2019-04-19 DIAGNOSIS — M51369 Other intervertebral disc degeneration, lumbar region without mention of lumbar back pain or lower extremity pain: Secondary | ICD-10-CM

## 2019-04-19 DIAGNOSIS — M5136 Other intervertebral disc degeneration, lumbar region: Secondary | ICD-10-CM

## 2019-04-19 DIAGNOSIS — M7061 Trochanteric bursitis, right hip: Secondary | ICD-10-CM

## 2019-04-19 DIAGNOSIS — Z8659 Personal history of other mental and behavioral disorders: Secondary | ICD-10-CM

## 2019-04-19 MED ORDER — LIDOCAINE HCL 1 % IJ SOLN
1.5000 mL | INTRAMUSCULAR | Status: AC | PRN
  Administered 2019-04-19: 1.5 mL

## 2019-04-19 MED ORDER — TRIAMCINOLONE ACETONIDE 40 MG/ML IJ SUSP
40.0000 mg | INTRAMUSCULAR | Status: AC | PRN
  Administered 2019-04-19: 40 mg via INTRA_ARTICULAR

## 2019-04-25 ENCOUNTER — Ambulatory Visit (INDEPENDENT_AMBULATORY_CARE_PROVIDER_SITE_OTHER): Payer: 59 | Admitting: Internal Medicine

## 2019-04-25 ENCOUNTER — Encounter: Payer: Self-pay | Admitting: Internal Medicine

## 2019-04-25 DIAGNOSIS — R739 Hyperglycemia, unspecified: Secondary | ICD-10-CM | POA: Diagnosis not present

## 2019-04-25 DIAGNOSIS — J309 Allergic rhinitis, unspecified: Secondary | ICD-10-CM | POA: Diagnosis not present

## 2019-04-25 DIAGNOSIS — J069 Acute upper respiratory infection, unspecified: Secondary | ICD-10-CM | POA: Diagnosis not present

## 2019-04-25 MED ORDER — AZITHROMYCIN 250 MG PO TABS: ORAL_TABLET | ORAL | 1 refills | Status: DC

## 2019-04-25 MED ORDER — HYDROCODONE-HOMATROPINE 5-1.5 MG/5ML PO SYRP: 5.0000 mL | ORAL_SOLUTION | Freq: Four times a day (QID) | ORAL | 0 refills | Status: DC | PRN

## 2019-04-25 MED ORDER — HYDROCODONE-HOMATROPINE 5-1.5 MG/5ML PO SYRP: 5.0000 mL | ORAL_SOLUTION | Freq: Four times a day (QID) | ORAL | 0 refills | Status: AC | PRN

## 2019-04-25 MED ORDER — PREDNISONE 10 MG PO TABS: ORAL_TABLET | ORAL | 0 refills | Status: DC

## 2019-04-25 NOTE — Patient Instructions (Signed)
Please take all new medication as prescribed 

## 2019-04-25 NOTE — Assessment & Plan Note (Signed)
/  Mild to mod, for predpac asd,  to f/u any worsening symptoms or concerns 

## 2019-04-25 NOTE — Assessment & Plan Note (Addendum)
Mild to mod, for antibx course,  to f/u any worsening symptoms or concerns  I spent 31 minutes in preparing to see the patient by review of recent labs, imaging and procedures, obtaining and reviewing separately obtained history, communicating with the patient and family or caregiver, ordering medications, tests or procedures, and documenting clinical information in the EHR including the differential Dx, treatment, and any further evaluation and other management of acute URI, allergies, hyperglycemia

## 2019-04-25 NOTE — Assessment & Plan Note (Signed)
stable overall by history and exam, recent data reviewed with pt, and pt to continue medical treatment as before,  to f/u any worsening symptoms or concerns  

## 2019-04-25 NOTE — Progress Notes (Signed)
Patient ID: Gabrielle Benson, female   DOB: [birth year], 64 y.o.   MRN: [id number]  Virtual Visit via Video Note  I connected with [name] on 04/25/19 at  1:40 PM EDT by a video enabled telemedicine application and verified that I am speaking with the correct person using two identifiers.  Location: Patient: at home Provider: at office   I discussed the limitations of evaluation and management by telemedicine and the availability of in person appointments. The patient expressed understanding and agreed to proceed.  History of Present Illness:  Here with 2-3 days acute onset fever, facial pain, pressure, headache, general weakness and malaise, and greenish d/c, with mild ST and cough, but pt denies chest pain, wheezing, increased sob or doe, orthopnea, PND, increased LE swelling, palpitations, dizziness or syncope. Does have several wks ongoing nasal allergy symptoms with clearish congestion, itch and sneezing, without fever, pain, ST, cough, swelling or wheezing.  Denies worsening depressive symptoms, suicidal ideation, or panic.   Pt denies polydipsia, polyuria Past Medical History:  Diagnosis Date  . Allergic rhinitis   . ANA positive   . Anemia    Iron deficiency  . Anxiety   . Cervicalgia    Chronic  . Complication of anesthesia    hard to go to sleep with Anesthesia-had to have Benadryl with Colonoscopy  . DDD (degenerative disc disease)    Cervical  . DJD (degenerative joint disease)   . Fibromyalgia   . GERD (gastroesophageal reflux disease)   . Hyperlipidemia   . Multinodular thyroid   . Neuropathy, peripheral   . Pneumonia   . Vitamin D deficiency 03/18/2011   Past Surgical History:  Procedure Laterality Date  . ABDOMINAL HYSTERECTOMY    . ABDOMINAL HYSTERECTOMY     partial  . BUNIONECTOMY  2011   left foot  . DILATION AND CURETTAGE OF UTERUS    . KNEE ARTHROSCOPY  2004   right  . MYOMECTOMY  2002   prior to hysterectomy  . PARTIAL KNEE  ARTHROPLASTY Right 10/07/2014   Procedure: RIGHT UNI KNEE ARTHROPLASTY LATERALLY ;  Surgeon: Paralee Cancel, MD;  Location: [facility];  Service: Orthopedics;  Laterality: Right;  . TONSILLECTOMY     age 83  . TOTAL SHOULDER ARTHROPLASTY Left 04/28/2017    reports that she has never smoked. She has never used smokeless tobacco. She reports current alcohol use of about 3.0 - 4.0 standard drinks of alcohol per week. She reports that she does not use drugs. family history includes Colon cancer in an other family member; Diabetes in her mother; Healthy in her daughter and son; Heart disease in her father; Lung cancer in an other family member; Sickle cell trait in an other family member. Allergies  Allergen Reactions  . Ampicillin   . Duloxetine     REACTION: dizzy, nervous  . Penicillins     Has patient had a PCN reaction causing immediate rash, facial/tongue/throat swelling, SOB or lightheadedness with hypotension: No Has patient had a PCN reaction causing severe rash involving mucus membranes or skin necrosis: No Has patient had a PCN reaction that required hospitalization: No Has patient had a PCN reaction occurring within the last 10 years: No If all of the above answers are "NO", then may proceed with Cephalosporin use.     Current Outpatient Medications on File Prior to Visit  Medication Sig Dispense Refill  . atorvastatin (LIPITOR) 20 MG tablet Take 1 tablet (20 mg total) by mouth daily. 90 tablet 3  .  azelastine (ASTELIN) 0.1 % nasal spray Place into both nostrils 2 (two) times daily. Use in each nostril as directed    . azelastine (OPTIVAR) 0.05 % ophthalmic solution 1 drop 2 (two) times daily.    . diclofenac sodium (VOLTAREN) 1 % GEL Apply 2-4 grams to affected joint up to 4 times a day as needed 4 Tube 2  . estradiol (ESTRACE) 1 MG tablet Take 1 mg by mouth daily.    Marland Kitchen levocetirizine (XYZAL) 5 MG tablet Take 5 mg by mouth every evening.    . linaclotide (LINZESS) 290 MCG CAPS capsule  Take 1 capsule (290 mcg total) by mouth daily before breakfast. (Patient taking differently: Take 290 mcg by mouth daily as needed. ) 90 capsule 3  . Menthol, Topical Analgesic, (BIOFREEZE EX) Apply 1 application topically daily as needed (joint pain).     . methocarbamol (ROBAXIN) 500 MG tablet TAKE 1 TABLET MOUTH 2 (TWO) TIMES DAILY AS NEEDED. 180 tablet 0  . naproxen sodium (ALEVE) 220 MG tablet Take 220 mg by mouth as needed.    . pantoprazole (PROTONIX) 40 MG tablet Take 1 tablet (40 mg total) by mouth daily. 90 tablet 3  . pregabalin (LYRICA) 150 MG capsule TAKE 1 CAPSULE (150 MG TOTAL) BY MOUTH 3 (THREE) TIMES DAILY. 270 capsule 0  . Vitamin D, Ergocalciferol, (DRISDOL) 1.25 MG (50000 UT) CAPS capsule Take 1 capsule (50,000 Units total) by mouth every 7 (seven) days. 12 capsule 0   No current facility-administered medications on file prior to visit.    Observations/Objective: Alert, NAD, appropriate mood and affect, resps normal, cn 2-12 intact, moves all 4s, no visible rash or swelling Lab Results  Component Value Date   WBC 4.4 10/20/2018   HGB 12.1 10/20/2018   HCT 37.8 10/20/2018   PLT 363.0 10/20/2018   GLUCOSE 101 (H) 10/20/2018   CHOL 247 (H) 10/20/2018   TRIG 74.0 10/20/2018   HDL 73.40 10/20/2018   LDLDIRECT 122.7 07/01/2011   LDLCALC 158 (H) 10/20/2018   ALT 16 10/20/2018   AST 19 10/20/2018   NA 140 10/20/2018   K 3.9 10/20/2018   CL 102 10/20/2018   CREATININE 0.66 10/20/2018   BUN 10 10/20/2018   CO2 31 10/20/2018   TSH 0.62 10/20/2018   INR 0.94 10/01/2014   HGBA1C 6.2 10/20/2018   Assessment and Plan: See notes  Follow Up Instructions: See notes   I discussed the assessment and treatment plan with the patient. The patient was provided an opportunity to ask questions and all were answered. The patient agreed with the plan and demonstrated an understanding of the instructions.   The patient was advised to call back or seek an in-person evaluation if the  symptoms worsen or if the condition fails to improve as anticipated.   Cathlean Cower, MD

## 2019-05-03 ENCOUNTER — Other Ambulatory Visit: Payer: Self-pay | Admitting: Physician Assistant

## 2019-05-03 ENCOUNTER — Other Ambulatory Visit: Payer: Self-pay | Admitting: Rheumatology

## 2019-05-03 NOTE — Telephone Encounter (Signed)
Last Visit: 04/19/2019 Next Visit: 10/24/2019  Okay to refill per Dr. Estanislado Pandy.

## 2019-05-03 NOTE — Telephone Encounter (Signed)
Last Visit: 04/19/2019 Next Visit: 10/24/2019  Last fill: 01/23/2019  Okay to refill lyrica?

## 2019-07-17 ENCOUNTER — Telehealth: Payer: Self-pay | Admitting: *Deleted

## 2019-07-17 NOTE — Telephone Encounter (Signed)
Received a drug utilization review from [facility] for Lyrica and Xyzal.  Reviewed by Hazel Sams, PA-C   Recommendation: Patient has been taking Lyrica as a long term medication. She is tolerating well. We do not prescribe Xyzal. Please have patient discuss further with prescribing doctor.

## 2019-07-27 NOTE — Telephone Encounter (Signed)
Attempted to contact the patient and left message for patient to call the office.  

## 2019-07-27 NOTE — Telephone Encounter (Signed)
Patient advised she has been taking Lyrica as a long term medication. She is tolerating well. We do not prescribe Xyzal. Patient advised to discuss further with prescribing doctor.

## 2019-10-10 NOTE — Progress Notes (Deleted)
Office Visit Note  Patient: Gabrielle Benson             Date of Birth: 1955/09/10           MRN: [id number]             PCP: [name], MD Referring: [name], MD Visit Date: 10/24/2019 Occupation: @GUAROCC @  Subjective:  No chief complaint on file.   History of Present Illness: Gabrielle Benson is a 64 y.o. female ***   Activities of Daily Living:  Patient reports morning stiffness for *** {minute/hour:19697}.   Patient {ACTIONS;DENIES/REPORTS:21021675::"Denies"} nocturnal pain.  Difficulty dressing/grooming: {ACTIONS;DENIES/REPORTS:21021675::"Denies"} Difficulty climbing stairs: {ACTIONS;DENIES/REPORTS:21021675::"Denies"} Difficulty getting out of chair: {ACTIONS;DENIES/REPORTS:21021675::"Denies"} Difficulty using hands for taps, buttons, cutlery, and/or writing: {ACTIONS;DENIES/REPORTS:21021675::"Denies"}  No Rheumatology ROS completed.   PMFS History:  Patient Active Problem List   Diagnosis Date Noted  . Acute upper respiratory infection 09/30/2017  . Hyperglycemia 10/14/2016  . DJD (degenerative joint disease), cervical 10/05/2016  . DDD (degenerative disc disease), lumbar 10/05/2016  . Primary osteoarthritis of left knee 10/05/2016  . Acute left-sided low back pain 03/25/2016  . Pain, joint, multiple sites 03/25/2016  . History of insomnia 03/25/2016  . Greater trochanteric bursitis of both hips 03/25/2016  . Wheezing 02/18/2016  . Chronic pain syndrome 10/11/2015  . Memory dysfunction 04/01/2015  . Status post right partial knee replacement 10/07/2014  . Cough 11/02/2013  . Herpes labialis 11/02/2013  . GERD (gastroesophageal reflux disease) 11/02/2012  . Anxiety 07/01/2011  . Vitamin D deficiency 03/18/2011  . Preventative health care 03/13/2011  . PERIPHERAL NEUROPATHY 10/03/2007  . Hyperlipidemia 01/05/2007  . ANEMIA-IRON DEFICIENCY 01/05/2007  . INSOMNIA-SLEEP DISORDER-UNSPEC 01/05/2007  . Allergic rhinitis 01/05/2007  . Irritable  bowel syndrome with constipation 01/05/2007  . Fibromyalgia syndrome 01/05/2007    Past Medical History:  Diagnosis Date  . Allergic rhinitis   . ANA positive   . Anemia    Iron deficiency  . Anxiety   . Cervicalgia    Chronic  . Complication of anesthesia    hard to go to sleep with Anesthesia-had to have Benadryl with Colonoscopy  . DDD (degenerative disc disease)    Cervical  . DJD (degenerative joint disease)   . Fibromyalgia   . GERD (gastroesophageal reflux disease)   . Hyperlipidemia   . Multinodular thyroid   . Neuropathy, peripheral   . Pneumonia   . Vitamin D deficiency 03/18/2011    Family History  Problem Relation Age of Onset  . Heart disease Father   . Sickle cell trait Other   . Lung cancer Other   . Colon cancer Other        Aunt  . Diabetes Mother   . Healthy Daughter   . Healthy Son   . Stomach cancer Neg Hx   . Rectal cancer Neg Hx   . Esophageal cancer Neg Hx    Past Surgical History:  Procedure Laterality Date  . ABDOMINAL HYSTERECTOMY    . ABDOMINAL HYSTERECTOMY     partial  . BUNIONECTOMY  2011   left foot  . DILATION AND CURETTAGE OF UTERUS    . KNEE ARTHROSCOPY  2004   right  . MYOMECTOMY  2002   prior to hysterectomy  . PARTIAL KNEE ARTHROPLASTY Right 10/07/2014   Procedure: RIGHT UNI KNEE ARTHROPLASTY LATERALLY ;  Surgeon: Paralee Cancel, MD;  Location: [facility];  Service: Orthopedics;  Laterality: Right;  . TONSILLECTOMY     age 19  . 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 Influenza Whole 11/22/2006  . Td 01/19/1992, 09/06/2014  . Tdap 07/01/2011     Objective: Vital Signs: There were no vitals taken for this visit.   Physical Exam   Musculoskeletal Exam: ***  CDAI Exam: CDAI Score: -- Patient Global: --; Provider Global: -- Swollen: --; Tender: -- Joint Exam  10/24/2019   No joint exam has been documented for this visit   There is currently no information documented on the homunculus. Go to the Rheumatology activity and complete the homunculus joint exam.  Investigation: No additional findings.  Imaging: No results found.  Recent Labs: Lab Results  Component Value Date   WBC 4.4 10/20/2018   HGB 12.1 10/20/2018   PLT 363.0 10/20/2018   NA 140 10/20/2018   K 3.9 10/20/2018   CL 102 10/20/2018   CO2 31 10/20/2018   GLUCOSE 101 (H) 10/20/2018   BUN 10 10/20/2018   CREATININE 0.66 10/20/2018   BILITOT 0.3 10/20/2018   ALKPHOS 110 10/20/2018   AST 19 10/20/2018   ALT 16 10/20/2018   PROT 7.8 10/20/2018   ALBUMIN 4.3 10/20/2018   CALCIUM 10.2 10/25/2018   GFRAA >60 08/28/2016    Speciality Comments: No specialty comments available.  Procedures:  No procedures performed Allergies: Ampicillin, Duloxetine, and Penicillins   Assessment / Plan:     Visit Diagnoses: No diagnosis found.  Orders: No orders of the defined types were placed in this encounter.  No orders of the defined types were placed in this encounter.   Face-to-face time spent with patient was *** minutes. Greater than 50% of time was spent in counseling and coordination of care.  Follow-Up Instructions: No follow-ups on file.   Earnestine Mealing, CMA  Note - This record has been created using Editor, commissioning.  Chart creation errors have been sought, but may not always  have been located. Such creation errors do not reflect on  the standard of medical care.

## 2019-10-22 NOTE — Progress Notes (Signed)
Office Visit Note  Patient: Gabrielle Benson             Date of Birth: 1955-11-20           MRN: [id number]             PCP: [name], MD Referring: [name], MD Visit Date: 11/05/2019 Occupation: @GUAROCC @  Subjective:  Neck stiffness   History of Present Illness: Gabrielle Benson is a 64 y.o. female with history of the fibromyalgia, osteoarthritis, and DDD.  She is taking Lyrica 150 mg 1 capsule by mouth three times daily and robaxin 500 mg 1 tablet BID for muscle spasms.  She states these medications have been effective at managing her symptoms.  She experiences intermittent neck pain and stiffness and occasional discomfort due to trochanteric bursitis of both hips.  She states she continues to follow up with Dr. Caralyn Guile for left wrist cortisone injections every 3 months.  She states her level of fatigue has been stable and she has started to sleep better at night.  She plans on trying to start walking on a daily basis.     Activities of Daily Living:  Patient reports morning stiffness for several  hours.   Patient Reports nocturnal pain.  Difficulty dressing/grooming: Denies Difficulty climbing stairs: Denies Difficulty getting out of chair: Denies Difficulty using hands for taps, buttons, cutlery, and/or writing: Denies  Review of Systems  Constitutional: Negative for fatigue.  HENT: Negative for mouth sores, mouth dryness and nose dryness.   Eyes: Positive for dryness. Negative for itching.  Respiratory: Negative for shortness of breath and difficulty breathing.   Cardiovascular: Negative for chest pain and palpitations.  Gastrointestinal: Positive for constipation. Negative for blood in stool and diarrhea.  Endocrine: Negative for increased urination.  Genitourinary: Negative for difficulty urinating.  Musculoskeletal: Positive for arthralgias, joint pain, myalgias, muscle weakness, morning stiffness, muscle tenderness and myalgias. Negative for joint  swelling.  Skin: Negative for color change, rash and redness.  Allergic/Immunologic: Negative for susceptible to infections.  Neurological: Negative for dizziness, headaches, memory loss and weakness.  Hematological: Positive for bruising/bleeding tendency.  Psychiatric/Behavioral: Negative for confusion.    PMFS History:  Patient Active Problem List   Diagnosis Date Noted  . Hypercalcemia 10/23/2019  . Acute upper respiratory infection 09/30/2017  . Hyperglycemia 10/14/2016  . DJD (degenerative joint disease), cervical 10/05/2016  . DDD (degenerative disc disease), lumbar 10/05/2016  . Primary osteoarthritis of left knee 10/05/2016  . Acute left-sided low back pain 03/25/2016  . Pain, joint, multiple sites 03/25/2016  . History of insomnia 03/25/2016  . Greater trochanteric bursitis of both hips 03/25/2016  . Wheezing 02/18/2016  . Chronic pain syndrome 10/11/2015  . Memory dysfunction 04/01/2015  . Status post right partial knee replacement 10/07/2014  . Cough 11/02/2013  . Herpes labialis 11/02/2013  . GERD (gastroesophageal reflux disease) 11/02/2012  . Anxiety 07/01/2011  . Vitamin D deficiency 03/18/2011  . Preventative health care 03/13/2011  . PERIPHERAL NEUROPATHY 10/03/2007  . Hyperlipidemia 01/05/2007  . ANEMIA-IRON DEFICIENCY 01/05/2007  . INSOMNIA-SLEEP DISORDER-UNSPEC 01/05/2007  . Allergic rhinitis 01/05/2007  . Irritable bowel syndrome with constipation 01/05/2007  . Fibromyalgia syndrome 01/05/2007    Past Medical History:  Diagnosis Date  . Allergic rhinitis   . ANA positive   . Anemia    Iron deficiency  . Anxiety   . Cervicalgia    Chronic  . Complication of anesthesia    hard to go to sleep with Anesthesia-had  to have Benadryl with Colonoscopy  . DDD (degenerative disc disease)    Cervical  . DJD (degenerative joint disease)   . Fibromyalgia   . GERD (gastroesophageal reflux disease)   . Hyperlipidemia   . Multinodular thyroid   .  Neuropathy, peripheral   . Pneumonia   . Vitamin D deficiency 03/18/2011    Family History  Problem Relation Age of Onset  . Heart disease Father   . Sickle cell trait Other   . Lung cancer Other   . Colon cancer Other        Aunt  . Diabetes Mother   . Healthy Daughter   . Healthy Son   . Stomach cancer Neg Hx   . Rectal cancer Neg Hx   . Esophageal cancer Neg Hx    Past Surgical History:  Procedure Laterality Date  . ABDOMINAL HYSTERECTOMY    . ABDOMINAL HYSTERECTOMY     partial  . BUNIONECTOMY  2011   left foot  . DILATION AND CURETTAGE OF UTERUS    . KNEE ARTHROSCOPY  2004   right  . MYOMECTOMY  2002   prior to hysterectomy  . PARTIAL KNEE ARTHROPLASTY Right 10/07/2014   Procedure: RIGHT UNI KNEE ARTHROPLASTY LATERALLY ;  Surgeon: Paralee Cancel, MD;  Location: [facility];  Service: Orthopedics;  Laterality: Right;  . TONSILLECTOMY     age 82  . 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 Influenza Whole 11/22/2006  . Influenza-Unspecified 11/22/2016  . PFIZER SARS-COV-2 Vaccination 04/28/2019, 05/21/2019  . Td 01/19/1992, 09/06/2014  . Tdap 07/01/2011  . Zoster Recombinat (Shingrix) 10/23/2019     Objective: Vital Signs: BP (!) 155/97 (BP Location: Right Arm, Patient Position: Sitting, Cuff Size: Normal)   Pulse 80   Resp 14   Ht 5' 3.5" (1.613 m)   Wt 187 lb (84.8 kg)   BMI 32.61 kg/m    Physical Exam Vitals and nursing note reviewed.  Constitutional:      Appearance: She is well-developed.  HENT:     Head: Normocephalic and atraumatic.  Eyes:     Conjunctiva/sclera: Conjunctivae normal.  Pulmonary:     Effort: Pulmonary effort is normal.  Abdominal:     Palpations: Abdomen is soft.  Musculoskeletal:     Cervical back: Normal range of motion.  Skin:    General: Skin is warm and dry.     Capillary  Refill: Capillary refill takes less than 2 seconds.  Neurological:     Mental Status: She is alert and oriented to person, place, and time.  Psychiatric:        Behavior: Behavior normal.      Musculoskeletal Exam: C-spine, thoracic spine, and lumbar spine good ROM.  Shoulder joints, elbow joints, wrist joints, MCPs, PIPs, and DIPs good ROM with no synovitis.  Complete fist formation bilaterally.  Hip joints, knee joints, and ankle joints good ROM with no discomfort.  No warmth or effusion of knee joints.  No tenderness or swelling of ankle joints.  No tenderness of MTP joints. Tenderness over both trochanteric bursa.   CDAI Exam: CDAI Score: -- Patient Global: --; Provider Global: -- Swollen: --; Tender: -- Joint Exam 11/05/2019   No joint exam has been documented for this visit   There is currently no information documented on the homunculus. Go to the Rheumatology  activity and complete the homunculus joint exam.  Investigation: No additional findings.  Imaging: No results found.  Recent Labs: Lab Results  Component Value Date   WBC 4.4 10/20/2018   HGB 12.1 10/20/2018   PLT 363.0 10/20/2018   NA 140 10/20/2018   K 3.9 10/20/2018   CL 102 10/20/2018   CO2 31 10/20/2018   GLUCOSE 101 (H) 10/20/2018   BUN 10 10/20/2018   CREATININE 0.66 10/20/2018   BILITOT 0.3 10/20/2018   ALKPHOS 110 10/20/2018   AST 19 10/20/2018   ALT 16 10/20/2018   PROT 7.8 10/20/2018   ALBUMIN 4.3 10/20/2018   CALCIUM 10.2 10/25/2018   GFRAA >60 08/28/2016    Speciality Comments: No specialty comments available.  Procedures:  No procedures performed Allergies: Ampicillin, Duloxetine, and Penicillins   Assessment / Plan:     Visit Diagnoses: Fibromyalgia syndrome -She experiences intermittent myalgias and muscle tenderness due to fibromyalgia.  Her discomfort is exacerbated by stenuous physical activity and weather changes.  She has tenderness to palpation over both trochanteric bursa.  She was encouraged to perform stretching exercises daily. She continues to take Lyrica 150 mg 1 capsule 3 times daily and Robaxin 500 mg twice daily PRN for muscle spasms. Refills of both mediations were sent to the pharmacy today.  We discussed the importance of regular exercise and good sleep hygiene. She was encouraged to start walking on a daily basis for exercise. She will follow up in 6 months.    Other fatigue: Chronic but stable.  We discussed the importance of regular exercise and good sleep hygiene.   Primary insomnia: She has been sleeping better at night.  Good sleep hygiene was discussed.   Primary osteoarthritis of left knee: She has good ROM with no discomfort.  No warmth or effusion of the left knee joint. She was encouraged to start walking on a daily basis for exercise.   Status post right partial knee replacement: Doing well.  She has good ROM with no discomfort.  She was encouraged to increase her exercise regimen.    Greater trochanteric bursitis of both hips: She has tenderness to palpation over both trochanteric bursa.  She was encouraged to perform stretching exercises daily.   DDD (degenerative disc disease), cervical: She has good ROM of the C-spine on exam.  No symptoms of radiculopathy. She has trapezius muscle tension and tenderness intermittently.   DDD (degenerative disc disease), lumbar: She is not experiencing any lower back pain at this time.    History of chronic pain -She is taking Lyrica 150 mg 1 capsule by mouth TID.  Other medical conditions are listed as follows:   History of neuropathy  History of scoliosis  History of anxiety  History of vitamin D deficiency  Orders: No orders of the defined types were placed in this encounter.  Meds ordered this encounter  Medications  . pregabalin (LYRICA) 150 MG capsule    Sig: Take 1 capsule (150 mg total) by mouth 3 (three) times daily.    Dispense:  270 capsule    Refill:  0    Not to exceed 5  additional fills before 07/22/2019  . methocarbamol (ROBAXIN) 500 MG tablet    Sig: Take 1 tablet by mouth twice daily as needed.    Dispense:  180 tablet    Refill:  0     Follow-Up Instructions: Return in about 6 months (around 05/05/2020) for Fibromyalgia, Osteoarthritis, DDD.   Ofilia Neas, PA-C  Note -  This record has been created using [employer].  Chart creation errors have been sought, but may not always  have been located. Such creation errors do not reflect on  the standard of medical care.

## 2019-10-23 ENCOUNTER — Encounter: Payer: Self-pay | Admitting: Internal Medicine

## 2019-10-23 ENCOUNTER — Ambulatory Visit (INDEPENDENT_AMBULATORY_CARE_PROVIDER_SITE_OTHER): Payer: 59 | Admitting: Internal Medicine

## 2019-10-23 ENCOUNTER — Other Ambulatory Visit: Payer: Self-pay

## 2019-10-23 VITALS — BP 130/90 | HR 65 | Temp 98.2°F | Ht 63.5 in | Wt 186.0 lb

## 2019-10-23 DIAGNOSIS — Z23 Encounter for immunization: Secondary | ICD-10-CM

## 2019-10-23 DIAGNOSIS — E559 Vitamin D deficiency, unspecified: Secondary | ICD-10-CM

## 2019-10-23 DIAGNOSIS — E785 Hyperlipidemia, unspecified: Secondary | ICD-10-CM | POA: Diagnosis not present

## 2019-10-23 DIAGNOSIS — R739 Hyperglycemia, unspecified: Secondary | ICD-10-CM

## 2019-10-23 DIAGNOSIS — Z0000 Encounter for general adult medical examination without abnormal findings: Secondary | ICD-10-CM

## 2019-10-23 NOTE — Progress Notes (Signed)
Subjective:    Patient ID: Gabrielle Benson, female    DOB: Oct 22, 1955, 64 y.o.   MRN: [id number]  HPI  Here for wellness and f/u;  Overall doing ok;  Pt denies Chest pain, worsening SOB, DOE, wheezing, orthopnea, PND, worsening LE edema, palpitations, dizziness or syncope.  Pt denies neurological change such as new headache, facial or extremity weakness.  Pt denies polydipsia, polyuria, or low sugar symptoms. Pt states overall good compliance with treatment and medications, good tolerability, and has been trying to follow appropriate diet.  Pt denies worsening depressive symptoms, suicidal ideation or panic. No fever, night sweats, wt loss, loss of appetite, or other constitutional symptoms.  Pt states good ability with ADL's, has low fall risk, home safety reviewed and adequate, no other significant changes in hearing or vision, and only occasionally active with exercise.  NO new complaints Past Medical History:  Diagnosis Date  . Allergic rhinitis   . ANA positive   . Anemia    Iron deficiency  . Anxiety   . Cervicalgia    Chronic  . Complication of anesthesia    hard to go to sleep with Anesthesia-had to have Benadryl with Colonoscopy  . DDD (degenerative disc disease)    Cervical  . DJD (degenerative joint disease)   . Fibromyalgia   . GERD (gastroesophageal reflux disease)   . Hyperlipidemia   . Multinodular thyroid   . Neuropathy, peripheral   . Pneumonia   . Vitamin D deficiency 03/18/2011   Past Surgical History:  Procedure Laterality Date  . ABDOMINAL HYSTERECTOMY    . ABDOMINAL HYSTERECTOMY     partial  . BUNIONECTOMY  2011   left foot  . DILATION AND CURETTAGE OF UTERUS    . KNEE ARTHROSCOPY  2004   right  . MYOMECTOMY  2002   prior to hysterectomy  . PARTIAL KNEE ARTHROPLASTY Right 10/07/2014   Procedure: RIGHT UNI KNEE ARTHROPLASTY LATERALLY ;  Surgeon: Paralee Cancel, MD;  Location: [facility];  Service: Orthopedics;  Laterality: Right;  . TONSILLECTOMY     age  54  . TOTAL SHOULDER ARTHROPLASTY Left 04/28/2017    reports that she has never smoked. She has never used smokeless tobacco. She reports current alcohol use of about 3.0 - 4.0 standard drinks of alcohol per week. She reports that she does not use drugs. family history includes Colon cancer in an other family member; Diabetes in her mother; Healthy in her daughter and son; Heart disease in her father; Lung cancer in an other family member; Sickle cell trait in an other family member. Allergies  Allergen Reactions  . Ampicillin   . Duloxetine     REACTION: dizzy, nervous  . Penicillins     Has patient had a PCN reaction causing immediate rash, facial/tongue/throat swelling, SOB or lightheadedness with hypotension: No Has patient had a PCN reaction causing severe rash involving mucus membranes or skin necrosis: No Has patient had a PCN reaction that required hospitalization: No Has patient had a PCN reaction occurring within the last 10 years: No If all of the above answers are "NO", then may proceed with Cephalosporin use.     Current Outpatient Medications on File Prior to Visit  Medication Sig Dispense Refill  . azelastine (ASTELIN) 0.1 % nasal spray Place into both nostrils 2 (two) times daily. Use in each nostril as directed    . azelastine (OPTIVAR) 0.05 % ophthalmic solution 1 drop 2 (two) times daily.    Marland Kitchen azithromycin [facility]  Z-PAK) 250 MG tablet 2 tab by mouth day 1, then 1 per day 6 tablet 1  . diclofenac sodium (VOLTAREN) 1 % GEL Apply 2-4 grams to affected joint up to 4 times a day as needed 4 Tube 2  . estradiol (ESTRACE) 1 MG tablet Take 1 mg by mouth daily.    Marland Kitchen levocetirizine (XYZAL) 5 MG tablet Take 5 mg by mouth every evening.    . linaclotide (LINZESS) 290 MCG CAPS capsule Take 1 capsule (290 mcg total) by mouth daily before breakfast. (Patient taking differently: Take 290 mcg by mouth daily as needed. ) 90 capsule 3  . Menthol, Topical Analgesic, (BIOFREEZE EX) Apply  1 application topically daily as needed (joint pain).     . methocarbamol (ROBAXIN) 500 MG tablet TAKE 1 TABLET MOUTH 2 (TWO) TIMES DAILY AS NEEDED. 180 tablet 0  . naproxen sodium (ALEVE) 220 MG tablet Take 220 mg by mouth as needed.    . pantoprazole (PROTONIX) 40 MG tablet Take 1 tablet (40 mg total) by mouth daily. 90 tablet 3  . predniSONE (DELTASONE) 10 MG tablet 3 tabs by mouth per day for 3 days,2tabs per day for 3 days,2tab per day for 3 days 18 tablet 0  . Spacer/Aero-Holding Chambers (Brent) MISC     . atorvastatin (LIPITOR) 20 MG tablet Take 1 tablet (20 mg total) by mouth daily. 90 tablet 3  . pregabalin (LYRICA) 150 MG capsule TAKE 1 CAPSULE (150 MG TOTAL) BY MOUTH 3 (THREE) TIMES DAILY. 270 capsule 0   No current facility-administered medications on file prior to visit.   Review of Systems All otherwise neg per pt    Objective:   Physical Exam BP 130/90 (BP Location: Left Arm, Patient Position: Sitting, Cuff Size: Large)   Pulse 65   Temp 98.2 F (36.8 C) (Oral)   Ht 5' 3.5" (1.613 m)   Wt 186 lb (84.4 kg)   SpO2 99%   BMI 32.43 kg/m  VS noted,  Constitutional: Pt appears in NAD HENT: Head: NCAT.  Right Ear: External ear normal.  Left Ear: External ear normal.  Eyes: . Pupils are equal, round, and reactive to light. Conjunctivae and EOM are normal Nose: without d/c or deformity Neck: Neck supple. Gross normal ROM Cardiovascular: Normal rate and regular rhythm.   Pulmonary/Chest: Effort normal and breath sounds without rales or wheezing.  Abd:  Soft, NT, ND, + BS, no organomegaly Neurological: Pt is alert. At baseline orientation, motor grossly intact Skin: Skin is warm. No rashes, other new lesions, no LE edema Psychiatric: Pt behavior is normal without agitation  All otherwise neg per pt Lab Results  Component Value Date   WBC 4.4 10/20/2018   HGB 12.1 10/20/2018   HCT 37.8 10/20/2018   PLT 363.0 10/20/2018   GLUCOSE 101 (H) 10/20/2018    CHOL 247 (H) 10/20/2018   TRIG 74.0 10/20/2018   HDL 73.40 10/20/2018   LDLDIRECT 122.7 07/01/2011   LDLCALC 158 (H) 10/20/2018   ALT 16 10/20/2018   AST 19 10/20/2018   NA 140 10/20/2018   K 3.9 10/20/2018   CL 102 10/20/2018   CREATININE 0.66 10/20/2018   BUN 10 10/20/2018   CO2 31 10/20/2018   TSH 0.62 10/20/2018   INR 0.94 10/01/2014   HGBA1C 6.2 10/20/2018      Assessment & Plan:

## 2019-10-23 NOTE — Patient Instructions (Addendum)
You would be due for your booster COVID in November 2021  You had the Shingles shot #1 today; please make a Nurse Visit for Shingles Shot #2 in 2 months  Please continue all other medications as before, and refills have been done if requested.  Please have the pharmacy call with any other refills you may need.  Please continue your efforts at being more active, low cholesterol diet, and weight control.  You are otherwise up to date with prevention measures today.  Please keep your appointments with your specialists as you may have planned  Please make an Appointment to return for your 1 year visit, or sooner if needed, with Lab testing by Appointment as well, to be done about 3-5 days before at the [facility] (so this is for TWO appointments - please see the scheduling desk as you leave)

## 2019-10-24 ENCOUNTER — Ambulatory Visit: Payer: 59 | Admitting: Physician Assistant

## 2019-10-28 ENCOUNTER — Encounter: Payer: Self-pay | Admitting: Internal Medicine

## 2019-10-28 NOTE — Assessment & Plan Note (Signed)
For f/u lab 

## 2019-10-28 NOTE — Assessment & Plan Note (Signed)
stable overall by history and exam, recent data reviewed with pt, and pt to continue medical treatment as before,  to f/u any worsening symptoms or concerns  

## 2019-10-28 NOTE — Assessment & Plan Note (Signed)

## 2019-10-28 NOTE — Assessment & Plan Note (Signed)
For oral replacement 

## 2019-11-05 ENCOUNTER — Ambulatory Visit: Payer: 59 | Admitting: Physician Assistant

## 2019-11-05 ENCOUNTER — Encounter: Payer: Self-pay | Admitting: Physician Assistant

## 2019-11-05 ENCOUNTER — Other Ambulatory Visit: Payer: Self-pay

## 2019-11-05 VITALS — BP 155/97 | HR 80 | Resp 14 | Ht 63.5 in | Wt 187.0 lb

## 2019-11-05 DIAGNOSIS — R5383 Other fatigue: Secondary | ICD-10-CM | POA: Diagnosis not present

## 2019-11-05 DIAGNOSIS — Z8739 Personal history of other diseases of the musculoskeletal system and connective tissue: Secondary | ICD-10-CM

## 2019-11-05 DIAGNOSIS — M1712 Unilateral primary osteoarthritis, left knee: Secondary | ICD-10-CM | POA: Diagnosis not present

## 2019-11-05 DIAGNOSIS — F5101 Primary insomnia: Secondary | ICD-10-CM

## 2019-11-05 DIAGNOSIS — Z96651 Presence of right artificial knee joint: Secondary | ICD-10-CM

## 2019-11-05 DIAGNOSIS — Z8639 Personal history of other endocrine, nutritional and metabolic disease: Secondary | ICD-10-CM

## 2019-11-05 DIAGNOSIS — Z87898 Personal history of other specified conditions: Secondary | ICD-10-CM

## 2019-11-05 DIAGNOSIS — Z8659 Personal history of other mental and behavioral disorders: Secondary | ICD-10-CM

## 2019-11-05 DIAGNOSIS — M5136 Other intervertebral disc degeneration, lumbar region: Secondary | ICD-10-CM

## 2019-11-05 DIAGNOSIS — M797 Fibromyalgia: Secondary | ICD-10-CM

## 2019-11-05 DIAGNOSIS — M7061 Trochanteric bursitis, right hip: Secondary | ICD-10-CM

## 2019-11-05 DIAGNOSIS — M5030 Other cervical disc degeneration, unspecified cervical region: Secondary | ICD-10-CM

## 2019-11-05 DIAGNOSIS — Z8669 Personal history of other diseases of the nervous system and sense organs: Secondary | ICD-10-CM

## 2019-11-05 DIAGNOSIS — M7062 Trochanteric bursitis, left hip: Secondary | ICD-10-CM

## 2019-11-05 MED ORDER — PREGABALIN 150 MG PO CAPS: 150.0000 mg | ORAL_CAPSULE | Freq: Three times a day (TID) | ORAL | 0 refills | Status: DC

## 2019-11-05 MED ORDER — METHOCARBAMOL 500 MG PO TABS
ORAL_TABLET | ORAL | 0 refills | Status: DC
Start: 2019-11-05 — End: 2020-02-07

## 2019-11-09 ENCOUNTER — Other Ambulatory Visit (INDEPENDENT_AMBULATORY_CARE_PROVIDER_SITE_OTHER): Payer: 59

## 2019-11-09 DIAGNOSIS — E785 Hyperlipidemia, unspecified: Secondary | ICD-10-CM | POA: Diagnosis not present

## 2019-11-09 DIAGNOSIS — R739 Hyperglycemia, unspecified: Secondary | ICD-10-CM

## 2019-11-09 DIAGNOSIS — E559 Vitamin D deficiency, unspecified: Secondary | ICD-10-CM

## 2019-11-09 LAB — LIPID PANEL
Cholesterol: 153 mg/dL (ref 0–200)
HDL: 60.9 mg/dL (ref >39.00)
LDL Cholesterol: 79 mg/dL (ref 0–99)
NonHDL: 92.47
Total CHOL/HDL Ratio: 3
Triglycerides: 65 mg/dL (ref 0.0–149.0)
VLDL: 13 mg/dL (ref 0.0–40.0)

## 2019-11-09 LAB — BASIC METABOLIC PANEL
BUN: 10 mg/dL (ref 6–23)
CO2: 30 mEq/L (ref 19–32)
Calcium: 10.6 mg/dL — ABNORMAL HIGH (ref 8.4–10.5)
Chloride: 103 mEq/L (ref 96–112)
Creatinine, Ser: 0.6 mg/dL (ref 0.40–1.20)
GFR: 96.23 mL/min (ref >60.00)
Glucose, Bld: 73 mg/dL (ref 70–99)
Potassium: 4.3 mEq/L (ref 3.5–5.1)
Sodium: 139 mEq/L (ref 135–145)

## 2019-11-09 LAB — URINALYSIS, ROUTINE W REFLEX MICROSCOPIC
Bilirubin Urine: NEGATIVE
Hgb urine dipstick: NEGATIVE
Ketones, ur: NEGATIVE
Leukocytes,Ua: NEGATIVE
Nitrite: NEGATIVE
RBC / HPF: NONE SEEN (ref 0–?)
Specific Gravity, Urine: 1.02 (ref 1.000–1.030)
Total Protein, Urine: NEGATIVE
Urine Glucose: NEGATIVE
Urobilinogen, UA: 0.2 (ref 0.0–1.0)
pH: 7.5 (ref 5.0–8.0)

## 2019-11-09 LAB — HEPATIC FUNCTION PANEL
ALT: 19 U/L (ref 0–35)
AST: 22 U/L (ref 0–37)
Albumin: 4.2 g/dL (ref 3.5–5.2)
Alkaline Phosphatase: 102 U/L (ref 39–117)
Bilirubin, Direct: 0.1 mg/dL (ref 0.0–0.3)
Total Bilirubin: 0.3 mg/dL (ref 0.2–1.2)
Total Protein: 7.3 g/dL (ref 6.0–8.3)

## 2019-11-09 LAB — TSH: TSH: 1.24 u[IU]/mL (ref 0.35–4.50)

## 2019-11-09 LAB — CBC WITH DIFFERENTIAL/PLATELET
Basophils Absolute: 0 10*3/uL (ref 0.0–0.1)
Basophils Relative: 0.2 % (ref 0.0–3.0)
Eosinophils Absolute: 0.1 10*3/uL (ref 0.0–0.7)
Eosinophils Relative: 1.7 % (ref 0.0–5.0)
HCT: 36.2 % (ref 36.0–46.0)
Hemoglobin: 11.5 g/dL — ABNORMAL LOW (ref 12.0–15.0)
Lymphocytes Relative: 44.4 % (ref 12.0–46.0)
Lymphs Abs: 2 10*3/uL (ref 0.7–4.0)
MCHC: 31.8 g/dL (ref 30.0–36.0)
MCV: 82.2 fl (ref 78.0–100.0)
Monocytes Absolute: 0.5 10*3/uL (ref 0.1–1.0)
Monocytes Relative: 10.5 % (ref 3.0–12.0)
Neutro Abs: 2 10*3/uL (ref 1.4–7.7)
Neutrophils Relative %: 43.2 % (ref 43.0–77.0)
Platelets: 332 10*3/uL (ref 150.0–400.0)
RBC: 4.4 Mil/uL (ref 3.87–5.11)
RDW: 13.1 % (ref 11.5–15.5)
WBC: 4.5 10*3/uL (ref 4.0–10.5)

## 2019-11-09 LAB — VITAMIN D 25 HYDROXY (VIT D DEFICIENCY, FRACTURES): VITD: 14.65 ng/mL — ABNORMAL LOW (ref 30.00–100.00)

## 2019-11-09 LAB — HEMOGLOBIN A1C: Hgb A1c MFr Bld: 6.3 % (ref 4.6–6.5)

## 2019-11-09 NOTE — Addendum Note (Signed)
Addended by: Eddie North C on: 11/09/2019 12:53 PM   Modules accepted: Orders

## 2019-11-10 ENCOUNTER — Encounter: Payer: Self-pay | Admitting: Internal Medicine

## 2019-11-16 LAB — PTH, INTACT AND CALCIUM
Calcium: 11 mg/dL — ABNORMAL HIGH (ref 8.7–10.3)
PTH: 81 pg/mL — ABNORMAL HIGH (ref 15–65)

## 2019-12-17 ENCOUNTER — Telehealth (INDEPENDENT_AMBULATORY_CARE_PROVIDER_SITE_OTHER): Payer: 59 | Admitting: Family

## 2019-12-17 DIAGNOSIS — J069 Acute upper respiratory infection, unspecified: Secondary | ICD-10-CM | POA: Diagnosis not present

## 2019-12-17 MED ORDER — HYDROCOD POLST-CPM POLST ER 10-8 MG/5ML PO SUER
5.0000 mL | Freq: Two times a day (BID) | ORAL | 0 refills | Status: DC | PRN
Start: 2019-12-17 — End: 2020-05-05

## 2019-12-17 NOTE — Progress Notes (Signed)
Gabrielle Benson is a 64 y.o. female with the following history as recorded in [employer]:  Patient Active Problem List   Diagnosis Date Noted   Hypercalcemia 10/23/2019   Acute upper respiratory infection 09/30/2017   Hyperglycemia 10/14/2016   DJD (degenerative joint disease), cervical 10/05/2016   DDD (degenerative disc disease), lumbar 10/05/2016   Primary osteoarthritis of left knee 10/05/2016   Acute left-sided low back pain 03/25/2016   Pain, joint, multiple sites 03/25/2016   History of insomnia 03/25/2016   Greater trochanteric bursitis of both hips 03/25/2016   Wheezing 02/18/2016   Chronic pain syndrome 10/11/2015   Memory dysfunction 04/01/2015   Status post right partial knee replacement 10/07/2014   Cough 11/02/2013   Herpes labialis 11/02/2013   GERD (gastroesophageal reflux disease) 11/02/2012   Anxiety 07/01/2011   Vitamin D deficiency 03/18/2011   Preventative health care 03/13/2011   PERIPHERAL NEUROPATHY 10/03/2007   Hyperlipidemia 01/05/2007   ANEMIA-IRON DEFICIENCY 01/05/2007   INSOMNIA-SLEEP DISORDER-UNSPEC 01/05/2007   Allergic rhinitis 01/05/2007   Irritable bowel syndrome with constipation 01/05/2007   Fibromyalgia syndrome 01/05/2007    Current Outpatient Medications  Medication Sig Dispense Refill   atorvastatin (LIPITOR) 20 MG tablet Take 1 tablet (20 mg total) by mouth daily. 90 tablet 3   azelastine (ASTELIN) 0.1 % nasal spray Place into both nostrils 2 (two) times daily. Use in each nostril as directed     azelastine (OPTIVAR) 0.05 % ophthalmic solution 1 drop 2 (two) times daily.     chlorpheniramine-HYDROcodone (TUSSIONEX PENNKINETIC ER) 10-8 MG/5ML SUER Take 5 mLs by mouth every 12 (twelve) hours as needed for cough. 115 mL 0   diclofenac sodium (VOLTAREN) 1 % GEL Apply 2-4 grams to affected joint up to 4 times a day as needed 4 Tube 2   estradiol (ESTRACE) 1 MG tablet Take 1 mg by mouth daily.      levocetirizine (XYZAL) 5 MG tablet Take 5 mg by mouth every evening.     linaclotide (LINZESS) 290 MCG CAPS capsule Take 1 capsule (290 mcg total) by mouth daily before breakfast. (Patient taking differently: Take 290 mcg by mouth daily as needed. ) 90 capsule 3   Menthol, Topical Analgesic, (BIOFREEZE EX) Apply 1 application topically daily as needed (joint pain).      methocarbamol (ROBAXIN) 500 MG tablet Take 1 tablet by mouth twice daily as needed. 180 tablet 0   naproxen sodium (ALEVE) 220 MG tablet Take 220 mg by mouth as needed.     pantoprazole (PROTONIX) 40 MG tablet Take 1 tablet (40 mg total) by mouth daily. 90 tablet 3   pregabalin (LYRICA) 150 MG capsule Take 1 capsule (150 mg total) by mouth 3 (three) times daily. 270 capsule 0   No current facility-administered medications for this visit.    Allergies: Ampicillin, Duloxetine, and Penicillins  Past Medical History:  Diagnosis Date   Allergic rhinitis    ANA positive    Anemia    Iron deficiency   Anxiety    Cervicalgia    Chronic   Complication of anesthesia    hard to go to sleep with Anesthesia-had to have Benadryl with Colonoscopy   DDD (degenerative disc disease)    Cervical   DJD (degenerative joint disease)    Fibromyalgia    GERD (gastroesophageal reflux disease)    Hyperlipidemia    Multinodular thyroid    Neuropathy, peripheral    Pneumonia    Vitamin D deficiency 03/18/2011    Past Surgical History:  Procedure Laterality Date   ABDOMINAL HYSTERECTOMY     ABDOMINAL HYSTERECTOMY     partial   BUNIONECTOMY  2011   left foot   DILATION AND CURETTAGE OF UTERUS     KNEE ARTHROSCOPY  2004   right   MYOMECTOMY  2002   prior to hysterectomy   PARTIAL KNEE ARTHROPLASTY Right 10/07/2014   Procedure: RIGHT UNI KNEE ARTHROPLASTY LATERALLY ;  Surgeon: Paralee Cancel, MD;  Location: [facility];  Service: Orthopedics;  Laterality: Right;   TONSILLECTOMY     age 62   TOTAL SHOULDER  ARTHROPLASTY Left 04/28/2017    Family History  Problem Relation Age of Onset   Heart disease Father    Sickle cell trait Other    Lung cancer Other    Colon cancer Other        Aunt   Diabetes Mother    Healthy Daughter    Healthy Son    Stomach cancer Neg Hx    Rectal cancer Neg Hx    Esophageal cancer Neg Hx     Social History   Tobacco Use   Smoking status: Never Smoker   Smokeless tobacco: Never Used  Substance Use Topics   Alcohol use: Yes    Alcohol/week: 3.0 - 4.0 standard drinks    Types: 3 - 4 Glasses of wine per week    Subjective:   I connected with [name] on 12/17/19 at 11:20 AM EST by a video enabled telemedicine application and verified that I am speaking with the correct person using two identifiers.   I discussed the limitations of evaluation and management by telemedicine and the availability of in person appointments. The patient expressed understanding and agreed to proceed. Provider in office/ patient is at home; provider and patient are only 2 people on video call.   Started last week with URI symptoms; notes she is prone to this type of illness; started Z-pak that her PCP has given her in the past on Friday and can already tell she is feeling better; is asking for refill on Tussionex cough syrup;   Objective:  There were no vitals filed for this visit.  General: Well developed, well nourished, in no acute distress  Head: Normocephalic and atraumatic  Lungs: Respirations unlabored;  Neurologic: Alert and oriented; speech intact; face symmetrical; moves all extremities well; CNII-XII intact without focal deficit   Assessment:  1. Acute URI     Plan:  Finish Z-pak; Rx for Tussionex given as requested; increase fluids, rest and follow-up worse, no better.    No follow-ups on file.  No orders of the defined types were placed in this encounter.   Requested Prescriptions   Signed Prescriptions Disp Refills    chlorpheniramine-HYDROcodone (TUSSIONEX PENNKINETIC ER) 10-8 MG/5ML SUER 115 mL 0    Sig: Take 5 mLs by mouth every 12 (twelve) hours as needed for cough.

## 2019-12-30 ENCOUNTER — Other Ambulatory Visit: Payer: Self-pay | Admitting: Internal Medicine

## 2019-12-30 NOTE — Telephone Encounter (Signed)
Please refill as per office routine med refill policy (all routine meds refilled for 3 mo or monthly per pt preference up to one year from last visit, then month to month grace period for 3 mo, then further med refills will have to be denied)  

## 2020-01-03 ENCOUNTER — Ambulatory Visit: Payer: 59

## 2020-01-04 ENCOUNTER — Ambulatory Visit (INDEPENDENT_AMBULATORY_CARE_PROVIDER_SITE_OTHER): Payer: 59

## 2020-01-04 ENCOUNTER — Other Ambulatory Visit: Payer: Self-pay

## 2020-01-04 DIAGNOSIS — Z23 Encounter for immunization: Secondary | ICD-10-CM

## 2020-01-15 ENCOUNTER — Other Ambulatory Visit: Payer: Self-pay | Admitting: Internal Medicine

## 2020-01-15 NOTE — Telephone Encounter (Signed)
Please refill as per office routine med refill policy (all routine meds refilled for 3 mo or monthly per pt preference up to one year from last visit, then month to month grace period for 3 mo, then further med refills will have to be denied)  

## 2020-02-06 ENCOUNTER — Other Ambulatory Visit: Payer: Self-pay | Admitting: Physician Assistant

## 2020-02-06 NOTE — Telephone Encounter (Signed)
Last Visit: 11/05/2019 Next Visit: 05/05/2020  Current Dose per office note on 11/05/2019:  Lyrica 150 mg 1 capsule by mouth TID. Robaxin 500 mg twice daily PRN for muscle spasms.  Last Fill: 11/05/2019  Okay to refill Lyrica and Methocarbamol?

## 2020-03-18 ENCOUNTER — Other Ambulatory Visit: Payer: Self-pay | Admitting: Internal Medicine

## 2020-04-21 NOTE — Progress Notes (Signed)
Office Visit Note  Patient: Gabrielle Benson             Date of Birth: 1955-07-21           MRN: [id number]             PCP: [name], MD Referring: [name], MD Visit Date: 05/05/2020 Occupation: @GUAROCC @  Subjective:  Pain in multiple joints and muscles.   History of Present Illness: Kaniyah Lisby is a 65 y.o. female with a history of osteoarthritis, degenerative disc disease and fibromyalgia syndrome.  She states she continues to have some pain and discomfort in her knee joints especially her left knee.  She also has discomfort in her bilateral trochanteric bursa.  She continues to have pain and discomfort in her neck and lower back.  She states the fibromyalgia flares off and on.  When the weather changes the fibromyalgia flares are more often. She takes Lyrica 150 mg 3 times a day and Robaxin 1 tablet twice a day.  Activities of Daily Living:  Patient reports morning stiffness for all day.  Patient Reports nocturnal pain.   Difficulty dressing/grooming: Denies Difficulty climbing stairs: Denies Difficulty getting out of chair: Denies Difficulty using hands for taps, buttons, cutlery, and/or writing: Reports  Review of Systems  Constitutional: Positive for fatigue. Negative for night sweats, weight gain and weight loss.  HENT: Negative for mouth sores, trouble swallowing, trouble swallowing, mouth dryness and nose dryness.   Eyes: Negative for pain, redness, itching, visual disturbance and dryness.  Respiratory: Negative for cough, shortness of breath and difficulty breathing.   Cardiovascular: Negative for chest pain, palpitations, hypertension, irregular heartbeat and swelling in legs/feet.  Gastrointestinal: Positive for constipation. Negative for blood in stool and diarrhea.  Endocrine: Negative for increased urination.  Genitourinary: Negative for difficulty urinating and vaginal dryness.  Musculoskeletal: Positive for arthralgias, joint pain,  myalgias, morning stiffness, muscle tenderness and myalgias. Negative for joint swelling and muscle weakness.  Skin: Negative for color change, rash, hair loss, redness, skin tightness, ulcers and sensitivity to sunlight.  Allergic/Immunologic: Negative for susceptible to infections.  Neurological: Positive for numbness. Negative for dizziness, headaches, memory loss, night sweats and weakness.  Hematological: Negative for bruising/bleeding tendency and swollen glands.  Psychiatric/Behavioral: Positive for depressed mood and sleep disturbance. Negative for confusion. The patient is nervous/anxious.     PMFS History:  Patient Active Problem List   Diagnosis Date Noted  . Hypercalcemia 10/23/2019  . Acute upper respiratory infection 09/30/2017  . Hyperglycemia 10/14/2016  . DJD (degenerative joint disease), cervical 10/05/2016  . DDD (degenerative disc disease), lumbar 10/05/2016  . Primary osteoarthritis of left knee 10/05/2016  . Acute left-sided low back pain 03/25/2016  . Pain, joint, multiple sites 03/25/2016  . History of insomnia 03/25/2016  . Greater trochanteric bursitis of both hips 03/25/2016  . Wheezing 02/18/2016  . Chronic pain syndrome 10/11/2015  . Memory dysfunction 04/01/2015  . Status post right partial knee replacement 10/07/2014  . Cough 11/02/2013  . Herpes labialis 11/02/2013  . GERD (gastroesophageal reflux disease) 11/02/2012  . Anxiety 07/01/2011  . Vitamin D deficiency 03/18/2011  . Preventative health care 03/13/2011  . PERIPHERAL NEUROPATHY 10/03/2007  . Hyperlipidemia 01/05/2007  . ANEMIA-IRON DEFICIENCY 01/05/2007  . INSOMNIA-SLEEP DISORDER-UNSPEC 01/05/2007  . Allergic rhinitis 01/05/2007  . Irritable bowel syndrome with constipation 01/05/2007  . Fibromyalgia syndrome 01/05/2007    Past Medical History:  Diagnosis Date  . Allergic rhinitis   . ANA positive   .  Anemia    Iron deficiency  . Anxiety   . Cervicalgia    Chronic  . Complication  of anesthesia    hard to go to sleep with Anesthesia-had to have Benadryl with Colonoscopy  . DDD (degenerative disc disease)    Cervical  . DJD (degenerative joint disease)   . Fibromyalgia   . GERD (gastroesophageal reflux disease)   . Hyperlipidemia   . Multinodular thyroid   . Neuropathy, peripheral   . Pneumonia   . Vitamin D deficiency 03/18/2011    Family History  Problem Relation Age of Onset  . Heart disease Father   . Sickle cell trait Other   . Lung cancer Other   . Colon cancer Other        Aunt  . Diabetes Mother   . Healthy Daughter   . Healthy Son   . Stomach cancer Neg Hx   . Rectal cancer Neg Hx   . Esophageal cancer Neg Hx    Past Surgical History:  Procedure Laterality Date  . ABDOMINAL HYSTERECTOMY    . ABDOMINAL HYSTERECTOMY     partial  . BUNIONECTOMY  2011   left foot  . DILATION AND CURETTAGE OF UTERUS    . KNEE ARTHROSCOPY  2004   right  . MYOMECTOMY  2002   prior to hysterectomy  . PARTIAL KNEE ARTHROPLASTY Right 10/07/2014   Procedure: RIGHT UNI KNEE ARTHROPLASTY LATERALLY ;  Surgeon: Paralee Cancel, MD;  Location: [facility];  Service: Orthopedics;  Laterality: Right;  . TONSILLECTOMY     age 64  . 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 Influenza Whole 11/22/2006  . Influenza-Unspecified 11/22/2016  . PFIZER(Purple Top)SARS-COV-2 Vaccination 04/28/2019, 05/21/2019, 02/23/2020  . Td 01/19/1992, 09/06/2014  . Tdap 07/01/2011  . Zoster Recombinat (Shingrix) 10/23/2019, 01/04/2020     Objective: Vital Signs: BP 134/86 (BP Location: Right Arm, Patient Position: Sitting, Cuff Size: Normal)   Pulse 72   Resp 13   Ht 5\' 3"  (1.6 m)   Wt 189 lb 14.4 oz (86.1 kg)   BMI 33.64 kg/m    Physical Exam Vitals and nursing note reviewed.  Constitutional:      Appearance: She is well-developed.   HENT:     Head: Normocephalic and atraumatic.  Eyes:     Conjunctiva/sclera: Conjunctivae normal.  Cardiovascular:     Rate and Rhythm: Normal rate and regular rhythm.     Heart sounds: Normal heart sounds.  Pulmonary:     Effort: Pulmonary effort is normal.     Breath sounds: Normal breath sounds.  Abdominal:     General: Bowel sounds are normal.     Palpations: Abdomen is soft.  Musculoskeletal:     Cervical back: Normal range of motion.  Lymphadenopathy:     Cervical: No cervical adenopathy.  Skin:    General: Skin is warm and dry.     Capillary Refill: Capillary refill takes less than 2 seconds.  Neurological:     Mental Status: She is alert and oriented to person, place, and time.  Psychiatric:        Behavior: Behavior normal.      Musculoskeletal Exam: C-spine was in good range of motion.  Shoulder joints, elbow joints, wrist joints, MCPs PIPs and DIPs with good range of motion with no synovitis.  She had  tenderness over bilateral trapezius region and over trochanteric area.  Hip joints, knee joints, ankles, MTPs and PIPs with good range of motion with no synovitis.  Right knee has partial replacement.  CDAI Exam: CDAI Score: -- Patient Global: --; Provider Global: -- Swollen: --; Tender: -- Joint Exam 05/05/2020   No joint exam has been documented for this visit   There is currently no information documented on the homunculus. Go to the Rheumatology activity and complete the homunculus joint exam.  Investigation: No additional findings.  Imaging: No results found.  Recent Labs: Lab Results  Component Value Date   WBC 4.5 11/09/2019   HGB 11.5 (L) 11/09/2019   PLT 332.0 11/09/2019   NA 139 11/09/2019   K 4.3 11/09/2019   CL 103 11/09/2019   CO2 30 11/09/2019   GLUCOSE 73 11/09/2019   BUN 10 11/09/2019   CREATININE 0.60 11/09/2019   BILITOT 0.3 11/09/2019   ALKPHOS 102 11/09/2019   AST 22 11/09/2019   ALT 19 11/09/2019   PROT 7.3 11/09/2019    ALBUMIN 4.2 11/09/2019   CALCIUM 11.0 (H) 11/12/2019   GFRAA >60 08/28/2016    Speciality Comments: No specialty comments available.  Procedures:  No procedures performed Allergies: Ampicillin, Duloxetine, and Penicillins   Assessment / Plan:     Visit Diagnoses: Fibromyalgia syndrome -she continues to have some pain and discomfort from fibromyalgia especially with the weather change.  She states she has not been taking medications on a regular basis.  I have advised her to reduce Lyrica 250 mg p.o. twice daily as needed and methocarbamol to 500 mg p.o. daily as needed.  She is currently on Lyrica 150 mg po tid prn, Methocarbamol 500 mg po bid.  Side effects of both medications were also reviewed.  Need for stretching exercise was emphasized.  Other fatigue-related to insomnia.  Primary insomnia-she has anxiety and insomnia.  Good sleep hygiene was discussed.  Joint stiffness of both hands-no synovitis was noted.  Have given her a handout on hand exercises.  Primary osteoarthritis of left knee-weight loss diet and exercise was emphasized.  Status post right partial knee replacement-doing better.  Greater trochanteric bursitis of both hips-IT band stretches were emphasized.  DDD (degenerative disc disease), cervical-she has some discomfort.  DDD (degenerative disc disease), lumbar-she is off-and-on discomfort.  History of chronic pain -pain is manageable with current medications.  History of neuropathy-she has longstanding history of neuropathy and had been followed by Dr. Erling Cruz in the past.  History of scoliosis  History of anxiety  History of vitamin D deficiency-she has vitamin D deficiency elevated PTH and hypercalcemia.  She has been followed by Dr. Jenny Reichmann.  She may need endocrinology evaluation.  Osteoporosis screening-patient states she is scheduled for DEXA scan by her PCP.  Orders: No orders of the defined types were placed in this encounter.  No orders of the  defined types were placed in this encounter.   Follow-Up Instructions: Return in about 6 months (around 11/04/2020) for Osteoarthritis, DDD,FMS.   Bo Merino, MD  Note - This record has been created using Editor, commissioning.  Chart creation errors have been sought, but may not always  have been located. Such creation errors do not reflect on  the standard of medical care.

## 2020-05-05 ENCOUNTER — Ambulatory Visit: Payer: 59 | Admitting: Rheumatology

## 2020-05-05 ENCOUNTER — Other Ambulatory Visit: Payer: Self-pay

## 2020-05-05 ENCOUNTER — Encounter: Payer: Self-pay | Admitting: Rheumatology

## 2020-05-05 VITALS — BP 134/86 | HR 72 | Resp 13 | Ht 63.0 in | Wt 189.9 lb

## 2020-05-05 DIAGNOSIS — M7062 Trochanteric bursitis, left hip: Secondary | ICD-10-CM

## 2020-05-05 DIAGNOSIS — M7061 Trochanteric bursitis, right hip: Secondary | ICD-10-CM

## 2020-05-05 DIAGNOSIS — R5383 Other fatigue: Secondary | ICD-10-CM

## 2020-05-05 DIAGNOSIS — F5101 Primary insomnia: Secondary | ICD-10-CM | POA: Diagnosis not present

## 2020-05-05 DIAGNOSIS — M797 Fibromyalgia: Secondary | ICD-10-CM | POA: Diagnosis not present

## 2020-05-05 DIAGNOSIS — Z87898 Personal history of other specified conditions: Secondary | ICD-10-CM

## 2020-05-05 DIAGNOSIS — M1712 Unilateral primary osteoarthritis, left knee: Secondary | ICD-10-CM

## 2020-05-05 DIAGNOSIS — Z8669 Personal history of other diseases of the nervous system and sense organs: Secondary | ICD-10-CM

## 2020-05-05 DIAGNOSIS — M5136 Other intervertebral disc degeneration, lumbar region: Secondary | ICD-10-CM

## 2020-05-05 DIAGNOSIS — M25649 Stiffness of unspecified hand, not elsewhere classified: Secondary | ICD-10-CM

## 2020-05-05 DIAGNOSIS — M5030 Other cervical disc degeneration, unspecified cervical region: Secondary | ICD-10-CM

## 2020-05-05 DIAGNOSIS — Z96651 Presence of right artificial knee joint: Secondary | ICD-10-CM

## 2020-05-05 DIAGNOSIS — Z8659 Personal history of other mental and behavioral disorders: Secondary | ICD-10-CM

## 2020-05-05 DIAGNOSIS — Z8739 Personal history of other diseases of the musculoskeletal system and connective tissue: Secondary | ICD-10-CM

## 2020-05-05 DIAGNOSIS — Z8639 Personal history of other endocrine, nutritional and metabolic disease: Secondary | ICD-10-CM

## 2020-05-05 DIAGNOSIS — M51369 Other intervertebral disc degeneration, lumbar region without mention of lumbar back pain or lower extremity pain: Secondary | ICD-10-CM

## 2020-05-05 DIAGNOSIS — Z13820 Encounter for screening for osteoporosis: Secondary | ICD-10-CM

## 2020-05-05 MED ORDER — METHOCARBAMOL 500 MG PO TABS: 1.0000 | ORAL_TABLET | Freq: Every day | ORAL | 0 refills | Status: DC | PRN

## 2020-05-05 MED ORDER — PREGABALIN 150 MG PO CAPS: 150.0000 mg | ORAL_CAPSULE | Freq: Two times a day (BID) | ORAL | 0 refills | Status: DC

## 2020-05-05 NOTE — Patient Instructions (Signed)
Hand Exercises Hand exercises can be helpful for almost anyone. These exercises can strengthen the hands, improve flexibility and movement, and increase blood flow to the hands. These results can make work and daily tasks easier. Hand exercises can be especially helpful for people who have joint pain from arthritis or have nerve damage from overuse (carpal tunnel syndrome). These exercises can also help people who have injured a hand. Exercises Most of these hand exercises are gentle stretching and motion exercises. It is usually safe to do them often throughout the day. Warming up your hands before exercise may help to reduce stiffness. You can do this with gentle massage or by placing your hands in warm water for 10-15 minutes. It is normal to feel some stretching, pulling, tightness, or mild discomfort as you begin new exercises. This will gradually improve. Stop an exercise right away if you feel sudden, severe pain or your pain gets worse. Ask your health care provider which exercises are best for you. Knuckle bend or "claw" fist 1. Stand or sit with your arm, hand, and all five fingers pointed straight up. Make sure to keep your wrist straight during the exercise. 2. Gently bend your fingers down toward your palm until the tips of your fingers are touching the top of your palm. Keep your big knuckle straight and just bend the small knuckles in your fingers. 3. Hold this position for __________ seconds. 4. Straighten (extend) your fingers back to the starting position. Repeat this exercise 5-10 times with each hand. Full finger fist 1. Stand or sit with your arm, hand, and all five fingers pointed straight up. Make sure to keep your wrist straight during the exercise. 2. Gently bend your fingers into your palm until the tips of your fingers are touching the middle of your palm. 3. Hold this position for __________ seconds. 4. Extend your fingers back to the starting position, stretching every  joint fully. Repeat this exercise 5-10 times with each hand. Straight fist 1. Stand or sit with your arm, hand, and all five fingers pointed straight up. Make sure to keep your wrist straight during the exercise. 2. Gently bend your fingers at the big knuckle, where your fingers meet your hand, and the middle knuckle. Keep the knuckle at the tips of your fingers straight and try to touch the bottom of your palm. 3. Hold this position for __________ seconds. 4. Extend your fingers back to the starting position, stretching every joint fully. Repeat this exercise 5-10 times with each hand. Tabletop 1. Stand or sit with your arm, hand, and all five fingers pointed straight up. Make sure to keep your wrist straight during the exercise. 2. Gently bend your fingers at the big knuckle, where your fingers meet your hand, as far down as you can while keeping the small knuckles in your fingers straight. Think of forming a tabletop with your fingers. 3. Hold this position for __________ seconds. 4. Extend your fingers back to the starting position, stretching every joint fully. Repeat this exercise 5-10 times with each hand. Finger spread 1. Place your hand flat on a table with your palm facing down. Make sure your wrist stays straight as you do this exercise. 2. Spread your fingers and thumb apart from each other as far as you can until you feel a gentle stretch. Hold this position for __________ seconds. 3. Bring your fingers and thumb tight together again. Hold this position for __________ seconds. Repeat this exercise 5-10 times with each hand.   Making circles 1. Stand or sit with your arm, hand, and all five fingers pointed straight up. Make sure to keep your wrist straight during the exercise. 2. Make a circle by touching the tip of your thumb to the tip of your index finger. 3. Hold for __________ seconds. Then open your hand wide. 4. Repeat this motion with your thumb and each finger on your  hand. Repeat this exercise 5-10 times with each hand. Thumb motion 1. Sit with your forearm resting on a table and your wrist straight. Your thumb should be facing up toward the ceiling. Keep your fingers relaxed as you move your thumb. 2. Lift your thumb up as high as you can toward the ceiling. Hold for __________ seconds. 3. Bend your thumb across your palm as far as you can, reaching the tip of your thumb for the small finger (pinkie) side of your palm. Hold for __________ seconds. Repeat this exercise 5-10 times with each hand. Grip strengthening 1. Hold a stress ball or other soft ball in the middle of your hand. 2. Slowly increase the pressure, squeezing the ball as much as you can without causing pain. Think of bringing the tips of your fingers into the middle of your palm. All of your finger joints should bend when doing this exercise. 3. Hold your squeeze for __________ seconds, then relax. Repeat this exercise 5-10 times with each hand.   Contact a health care provider if:  Your hand pain or discomfort gets much worse when you do an exercise.  Your hand pain or discomfort does not improve within 2 hours after you exercise. If you have any of these problems, stop doing these exercises right away. Do not do them again unless your health care provider says that you can. Get help right away if:  You develop sudden, severe hand pain or swelling. If this happens, stop doing these exercises right away. Do not do them again unless your health care provider says that you can. This information is not intended to replace advice given to you by your health care provider. Make sure you discuss any questions you have with your health care provider. Document Revised: 04/27/2018 Document Reviewed: 01/05/2018 Elsevier Patient Education  2021 Elsevier Inc.  

## 2020-05-12 ENCOUNTER — Telehealth: Payer: Self-pay | Admitting: Internal Medicine

## 2020-05-12 NOTE — Telephone Encounter (Signed)
Tried calling patient, unable to leave message.

## 2020-05-12 NOTE — Telephone Encounter (Signed)
May be ok as far as I know, but remember this is not FDA approved, and would have to be taken at her own risk

## 2020-05-12 NOTE — Telephone Encounter (Signed)
Patient wondering if it will be okay for her to start Nutrafol for hair loss

## 2020-05-14 NOTE — Telephone Encounter (Signed)
Left message on patient's voicemail with information.

## 2020-05-24 ENCOUNTER — Other Ambulatory Visit: Payer: Self-pay | Admitting: Internal Medicine

## 2020-07-02 ENCOUNTER — Encounter: Payer: Self-pay | Admitting: Family Medicine

## 2020-07-02 ENCOUNTER — Telehealth (INDEPENDENT_AMBULATORY_CARE_PROVIDER_SITE_OTHER): Payer: 59 | Admitting: Family Medicine

## 2020-07-02 DIAGNOSIS — R059 Cough, unspecified: Secondary | ICD-10-CM

## 2020-07-02 MED ORDER — AZITHROMYCIN 250 MG PO TABS: ORAL_TABLET | ORAL | 0 refills | Status: AC

## 2020-07-02 MED ORDER — HYDROCOD POLST-CPM POLST ER 10-8 MG/5ML PO SUER: 2.5000 mL | Freq: Two times a day (BID) | ORAL | 0 refills | Status: DC | PRN

## 2020-07-02 NOTE — Progress Notes (Signed)
Virtual Visit via Video Note  I connected with Gabrielle Benson on 07/02/20 at 3:01 PM by phone - connected 3 times by video - no audio from pt - 3 calls went straight to voicemail. 4th call connect. Verified that I am speaking with the correct person using two identifiers.  Patient location: work - in private room.  My location: office  [place].    I discussed the limitations, risks, security and privacy concerns of performing an evaluation and management service by telephone and the availability of in person appointments. I also discussed with the patient that there may be a patient responsible charge related to this service. The patient expressed understanding and agreed to proceed, consent obtained  Chief complaint:  Chief Complaint  Patient presents with   Cough    Pt reports get this about once a year, has had cough, congestion, notes white in the back of her throat, pt reports started about 1 week ago.      History of Present Illness: Gabrielle Benson is a 65 y.o. female  Cough: Noted in past this time of year with weather change. Usually meds prescribed in past. Cold air in meeting last week. Cough during day, some with drainage down throat at back. Started 1 week ago - 06/26/19.  Has not tested for covid 19 infection.  Wearing mask at work.  No fever.  No shortness of breath, eating and drinking ok.  White patches on back on throat. Zpak has been used in past with tussionex that has worked well. Sleeping ok, but cough has been more notable at night.  No known sick contacts. No known exposure to strep throat.  Tx: mucinex. Using astelin and xyzal. Followed by allergist.  No dyspnea, no wheeze.  No face pain.   Covid vaccine x3 with booster 02/23/20.       Patient Active Problem List   Diagnosis Date Noted   Hypercalcemia 10/23/2019   Acute upper respiratory infection 09/30/2017   Hyperglycemia 10/14/2016   DJD (degenerative joint disease), cervical  10/05/2016   DDD (degenerative disc disease), lumbar 10/05/2016   Primary osteoarthritis of left knee 10/05/2016   Acute left-sided low back pain 03/25/2016   Pain, joint, multiple sites 03/25/2016   History of insomnia 03/25/2016   Greater trochanteric bursitis of both hips 03/25/2016   Wheezing 02/18/2016   Chronic pain syndrome 10/11/2015   Memory dysfunction 04/01/2015   Status post right partial knee replacement 10/07/2014   Cough 11/02/2013   Herpes labialis 11/02/2013   GERD (gastroesophageal reflux disease) 11/02/2012   Anxiety 07/01/2011   Vitamin D deficiency 03/18/2011   Preventative health care 03/13/2011   PERIPHERAL NEUROPATHY 10/03/2007   Hyperlipidemia 01/05/2007   ANEMIA-IRON DEFICIENCY 01/05/2007   INSOMNIA-SLEEP DISORDER-UNSPEC 01/05/2007   Allergic rhinitis 01/05/2007   Irritable bowel syndrome with constipation 01/05/2007   Fibromyalgia syndrome 01/05/2007   Past Medical History:  Diagnosis Date   Allergic rhinitis    ANA positive    Anemia    Iron deficiency   Anxiety    Cervicalgia    Chronic   Complication of anesthesia    hard to go to sleep with Anesthesia-had to have Benadryl with Colonoscopy   DDD (degenerative disc disease)    Cervical   DJD (degenerative joint disease)    Fibromyalgia    GERD (gastroesophageal reflux disease)    Hyperlipidemia    Multinodular thyroid    Neuropathy, peripheral    Pneumonia    Vitamin D deficiency 03/18/2011  Past Surgical History:  Procedure Laterality Date   ABDOMINAL HYSTERECTOMY     ABDOMINAL HYSTERECTOMY     partial   BUNIONECTOMY  2011   left foot   DILATION AND CURETTAGE OF UTERUS     KNEE ARTHROSCOPY  2004   right   MYOMECTOMY  2002   prior to hysterectomy   PARTIAL KNEE ARTHROPLASTY Right 10/07/2014   Procedure: RIGHT UNI KNEE ARTHROPLASTY LATERALLY ;  Surgeon: Paralee Cancel, MD;  Location: [facility];  Service: Orthopedics;  Laterality: Right;   TONSILLECTOMY     age 13   TOTAL SHOULDER  ARTHROPLASTY Left 04/28/2017   Allergies  Allergen Reactions   Ampicillin    Duloxetine     REACTION: dizzy, nervous   Penicillins     Has patient had a PCN reaction causing immediate rash, facial/tongue/throat swelling, SOB or lightheadedness with hypotension: No Has patient had a PCN reaction causing severe rash involving mucus membranes or skin necrosis: No Has patient had a PCN reaction that required hospitalization: No Has patient had a PCN reaction occurring within the last 10 years: No If all of the above answers are "NO", then may proceed with Cephalosporin use.     Prior to Admission medications   Medication Sig Start Date End Date Taking? Authorizing Provider  atorvastatin (LIPITOR) 20 MG tablet TAKE 1 TABLET BY MOUTH EVERY DAY 01/15/20  Yes [name], MD  azelastine (ASTELIN) 0.1 % nasal spray Place into both nostrils 2 (two) times daily. Use in each nostril as directed   Yes [provider]  azelastine (OPTIVAR) 0.05 % ophthalmic solution 1 drop 2 (two) times daily.   Yes [provider]  diclofenac sodium (VOLTAREN) 1 % GEL Apply 2-4 grams to affected joint up to 4 times a day as needed 02/22/18  Yes Deveshwar, Abel Presto, MD  levocetirizine (XYZAL) 5 MG tablet Take 5 mg by mouth every evening.   Yes [provider]  LINZESS 290 MCG CAPS capsule TAKE 1 CAPSULE BY MOUTH EVERY DAY BEFORE BREAKFAST 05/26/20  Yes Gatha Mayer, MD  Menthol, Topical Analgesic, (BIOFREEZE EX) Apply 1 application topically daily as needed (joint pain).    Yes [provider]  methocarbamol (ROBAXIN) 500 MG tablet Take 1 tablet (500 mg total) by mouth daily as needed. 05/05/20  Yes Deveshwar, Abel Presto, MD  naproxen sodium (ALEVE) 220 MG tablet Take 220 mg by mouth as needed.   Yes [provider]  pantoprazole (PROTONIX) 40 MG tablet TAKE 1 TABLET BY MOUTH EVERY DAY 12/31/19  Yes [name], MD  pregabalin (LYRICA) 150 MG capsule Take 1 capsule (150 mg  total) by mouth 2 (two) times daily. 05/05/20  Yes Deveshwar, Abel Presto, MD   Social History   Socioeconomic History   Marital status: Married    Spouse name: Not on file   Number of children: 2   Years of education: Not on file   Highest education level: Not on file  Occupational History   Occupation: receptionist    Comment: [employer]-former [employer]   Tobacco Use   Smoking status: Never   Smokeless tobacco: Never  Vaping Use   Vaping Use: Never used  Substance and Sexual Activity   Alcohol use: Yes    Alcohol/week: 3.0 - 4.0 standard drinks    Types: 3 - 4 Glasses of wine per week   Drug use: No   Sexual activity: Not on file  Other Topics Concern   Not on file  Social History Narrative   Married 2 Ambulance person at [employer]   Social Determinants of Health   Financial Resource Strain: Not on file  Food Insecurity: Not on file  Transportation Needs: Not on file  Physical Activity: Not on file  Stress: Not on file  Social Connections: Not on file  Intimate Partner Violence: Not on file   Observations/Objective: There were no vitals filed for this visit. Speaking in full sentences, no distress.  Coherent responses.   Assessment and Plan: Cough - Plan: azithromycin (ZITHROMAX) 250 MG tablet, chlorpheniramine-HYDROcodone (TUSSIONEX PENNKINETIC ER) 10-8 MG/5ML SUER  - possible viral vs early lrti/bronchitis. Persistent cough. Tussionex if needed for cough, can fill Zpak if not improving in next few days - possible side effects and risks of meds discussed with understanding expressed.   Follow Up Instructions: Rtc/ER precautions if worse.    I discussed the assessment and treatment plan with the patient. The patient was provided an opportunity to ask questions and all were answered. The patient agreed with the plan and demonstrated an understanding of the instructions.   The patient was advised to call back or seek an in-person evaluation if the symptoms worsen or if  the condition fails to improve as anticipated.  I provided 18 minutes of non-face-to-face time during this encounter.   Wendie Agreste, MD

## 2020-07-23 ENCOUNTER — Other Ambulatory Visit: Payer: Self-pay | Admitting: Internal Medicine

## 2020-09-27 ENCOUNTER — Other Ambulatory Visit: Payer: Self-pay | Admitting: Internal Medicine

## 2020-10-09 ENCOUNTER — Telehealth: Payer: Self-pay | Admitting: Internal Medicine

## 2020-10-09 NOTE — Telephone Encounter (Signed)
Pt has appt with PCP 10/13, lab appt 10/10  Existing lab orders will expire 10/5, please extend.

## 2020-10-09 NOTE — Telephone Encounter (Signed)
I think they look extended to 2025 now, thanks

## 2020-10-21 NOTE — Progress Notes (Deleted)
Office Visit Note  Patient: Gabrielle Benson             Date of Birth: April 22, 1955           MRN: [id number]             PCP: [name], MD Referring: [name], MD Visit Date: 11/04/2020 Occupation: @GUAROCC @  Subjective:  No chief complaint on file.   History of Present Illness: Gabrielle Benson is a 65 y.o. female ***   Activities of Daily Living:  Patient reports morning stiffness for *** {minute/hour:19697}.   Patient {ACTIONS;DENIES/REPORTS:21021675::"Denies"} nocturnal pain.  Difficulty dressing/grooming: {ACTIONS;DENIES/REPORTS:21021675::"Denies"} Difficulty climbing stairs: {ACTIONS;DENIES/REPORTS:21021675::"Denies"} Difficulty getting out of chair: {ACTIONS;DENIES/REPORTS:21021675::"Denies"} Difficulty using hands for taps, buttons, cutlery, and/or writing: {ACTIONS;DENIES/REPORTS:21021675::"Denies"}  No Rheumatology ROS completed.   PMFS History:  Patient Active Problem List   Diagnosis Date Noted  . Hypercalcemia 10/23/2019  . Acute upper respiratory infection 09/30/2017  . Hyperglycemia 10/14/2016  . DJD (degenerative joint disease), cervical 10/05/2016  . DDD (degenerative disc disease), lumbar 10/05/2016  . Primary osteoarthritis of left knee 10/05/2016  . Acute left-sided low back pain 03/25/2016  . Pain, joint, multiple sites 03/25/2016  . History of insomnia 03/25/2016  . Greater trochanteric bursitis of both hips 03/25/2016  . Wheezing 02/18/2016  . Chronic pain syndrome 10/11/2015  . Memory dysfunction 04/01/2015  . Status post right partial knee replacement 10/07/2014  . Cough 11/02/2013  . Herpes labialis 11/02/2013  . GERD (gastroesophageal reflux disease) 11/02/2012  . Anxiety 07/01/2011  . Vitamin D deficiency 03/18/2011  . Preventative health care 03/13/2011  . PERIPHERAL NEUROPATHY 10/03/2007  . Hyperlipidemia 01/05/2007  . ANEMIA-IRON DEFICIENCY 01/05/2007  . INSOMNIA-SLEEP DISORDER-UNSPEC 01/05/2007  . Allergic  rhinitis 01/05/2007  . Irritable bowel syndrome with constipation 01/05/2007  . Fibromyalgia syndrome 01/05/2007    Past Medical History:  Diagnosis Date  . Allergic rhinitis   . ANA positive   . Anemia    Iron deficiency  . Anxiety   . Cervicalgia    Chronic  . Complication of anesthesia    hard to go to sleep with Anesthesia-had to have Benadryl with Colonoscopy  . DDD (degenerative disc disease)    Cervical  . DJD (degenerative joint disease)   . Fibromyalgia   . GERD (gastroesophageal reflux disease)   . Hyperlipidemia   . Multinodular thyroid   . Neuropathy, peripheral   . Pneumonia   . Vitamin D deficiency 03/18/2011    Family History  Problem Relation Age of Onset  . Heart disease Father   . Sickle cell trait Other   . Lung cancer Other   . Colon cancer Other        Aunt  . Diabetes Mother   . Healthy Daughter   . Healthy Son   . Stomach cancer Neg Hx   . Rectal cancer Neg Hx   . Esophageal cancer Neg Hx    Past Surgical History:  Procedure Laterality Date  . ABDOMINAL HYSTERECTOMY    . ABDOMINAL HYSTERECTOMY     partial  . BUNIONECTOMY  2011   left foot  . DILATION AND CURETTAGE OF UTERUS    . KNEE ARTHROSCOPY  2004   right  . MYOMECTOMY  2002   prior to hysterectomy  . PARTIAL KNEE ARTHROPLASTY Right 10/07/2014   Procedure: RIGHT UNI KNEE ARTHROPLASTY LATERALLY ;  Surgeon: Paralee Cancel, MD;  Location: [facility];  Service: Orthopedics;  Laterality: Right;  . TONSILLECTOMY     age  16  . 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 Influenza Whole 11/22/2006  . Influenza-Unspecified 11/22/2016  . PFIZER(Purple Top)SARS-COV-2 Vaccination 04/28/2019, 05/21/2019, 02/23/2020  . Td 01/19/1992, 09/06/2014  . Tdap 07/01/2011  . Zoster Recombinat (Shingrix) 10/23/2019, 01/04/2020     Objective: Vital Signs:  There were no vitals taken for this visit.   Physical Exam   Musculoskeletal Exam: ***  CDAI Exam: CDAI Score: -- Patient Global: --; Provider Global: -- Swollen: --; Tender: -- Joint Exam 11/04/2020   No joint exam has been documented for this visit   There is currently no information documented on the homunculus. Go to the Rheumatology activity and complete the homunculus joint exam.  Investigation: No additional findings.  Imaging: No results found.  Recent Labs: Lab Results  Component Value Date   WBC 4.5 11/09/2019   HGB 11.5 (L) 11/09/2019   PLT 332.0 11/09/2019   NA 139 11/09/2019   K 4.3 11/09/2019   CL 103 11/09/2019   CO2 30 11/09/2019   GLUCOSE 73 11/09/2019   BUN 10 11/09/2019   CREATININE 0.60 11/09/2019   BILITOT 0.3 11/09/2019   ALKPHOS 102 11/09/2019   AST 22 11/09/2019   ALT 19 11/09/2019   PROT 7.3 11/09/2019   ALBUMIN 4.2 11/09/2019   CALCIUM 11.0 (H) 11/12/2019   GFRAA >60 08/28/2016    Speciality Comments: No specialty comments available.  Procedures:  No procedures performed Allergies: Ampicillin, Duloxetine, and Penicillins   Assessment / Plan:     Visit Diagnoses: No diagnosis found.  Orders: No orders of the defined types were placed in this encounter.  No orders of the defined types were placed in this encounter.   Face-to-face time spent with patient was *** minutes. Greater than 50% of time was spent in counseling and coordination of care.  Follow-Up Instructions: No follow-ups on file.   Earnestine Mealing, CMA  Note - This record has been created using Editor, commissioning.  Chart creation errors have been sought, but may not always  have been located. Such creation errors do not reflect on  the standard of medical care.

## 2020-10-27 ENCOUNTER — Other Ambulatory Visit: Payer: Self-pay

## 2020-10-27 ENCOUNTER — Other Ambulatory Visit (INDEPENDENT_AMBULATORY_CARE_PROVIDER_SITE_OTHER): Payer: 59

## 2020-10-27 DIAGNOSIS — E559 Vitamin D deficiency, unspecified: Secondary | ICD-10-CM

## 2020-10-27 DIAGNOSIS — R739 Hyperglycemia, unspecified: Secondary | ICD-10-CM | POA: Diagnosis not present

## 2020-10-27 DIAGNOSIS — E785 Hyperlipidemia, unspecified: Secondary | ICD-10-CM | POA: Diagnosis not present

## 2020-10-27 LAB — URINALYSIS, ROUTINE W REFLEX MICROSCOPIC
Bilirubin Urine: NEGATIVE
Hgb urine dipstick: NEGATIVE
Ketones, ur: NEGATIVE
Nitrite: NEGATIVE
RBC / HPF: NONE SEEN (ref 0–?)
Specific Gravity, Urine: >=1.03 — AB (ref 1.000–1.030)
Total Protein, Urine: NEGATIVE
Urine Glucose: NEGATIVE
Urobilinogen, UA: 0.2 (ref 0.0–1.0)
pH: 5.5 (ref 5.0–8.0)

## 2020-10-27 LAB — CBC WITH DIFFERENTIAL/PLATELET
Basophils Absolute: 0 10*3/uL (ref 0.0–0.1)
Basophils Relative: 0.8 % (ref 0.0–3.0)
Eosinophils Absolute: 0 10*3/uL (ref 0.0–0.7)
Eosinophils Relative: 1.2 % (ref 0.0–5.0)
HCT: 33.9 % — ABNORMAL LOW (ref 36.0–46.0)
Hemoglobin: 11.1 g/dL — ABNORMAL LOW (ref 12.0–15.0)
Lymphocytes Relative: 40 % (ref 12.0–46.0)
Lymphs Abs: 1.6 10*3/uL (ref 0.7–4.0)
MCHC: 32.7 g/dL (ref 30.0–36.0)
MCV: 84.3 fl (ref 78.0–100.0)
Monocytes Absolute: 0.4 10*3/uL (ref 0.1–1.0)
Monocytes Relative: 9.4 % (ref 3.0–12.0)
Neutro Abs: 2 10*3/uL (ref 1.4–7.7)
Neutrophils Relative %: 48.6 % (ref 43.0–77.0)
Platelets: 293 10*3/uL (ref 150.0–400.0)
RBC: 4.02 Mil/uL (ref 3.87–5.11)
RDW: 13.1 % (ref 11.5–15.5)
WBC: 4.1 10*3/uL (ref 4.0–10.5)

## 2020-10-27 LAB — LIPID PANEL
Cholesterol: 216 mg/dL — ABNORMAL HIGH (ref 0–200)
HDL: 60.8 mg/dL (ref >39.00)
LDL Cholesterol: 137 mg/dL — ABNORMAL HIGH (ref 0–99)
NonHDL: 155.11
Total CHOL/HDL Ratio: 4
Triglycerides: 93 mg/dL (ref 0.0–149.0)
VLDL: 18.6 mg/dL (ref 0.0–40.0)

## 2020-10-27 LAB — HEMOGLOBIN A1C: Hgb A1c MFr Bld: 6 % (ref 4.6–6.5)

## 2020-10-27 LAB — VITAMIN D 25 HYDROXY (VIT D DEFICIENCY, FRACTURES): VITD: 11.4 ng/mL — ABNORMAL LOW (ref 30.00–100.00)

## 2020-10-27 LAB — TSH: TSH: 1.61 u[IU]/mL (ref 0.35–5.50)

## 2020-10-28 ENCOUNTER — Encounter: Payer: Self-pay | Admitting: Internal Medicine

## 2020-10-28 LAB — COMPLETE METABOLIC PANEL WITH GFR
AG Ratio: 1.3 (calc) (ref 1.0–2.5)
ALT: 13 U/L (ref 6–29)
AST: 20 U/L (ref 10–35)
Albumin: 4 g/dL (ref 3.6–5.1)
Alkaline phosphatase (APISO): 97 U/L (ref 37–153)
BUN: 14 mg/dL (ref 7–25)
CO2: 25 mmol/L (ref 20–32)
Calcium: 10.7 mg/dL — ABNORMAL HIGH (ref 8.6–10.4)
Chloride: 104 mmol/L (ref 98–110)
Creat: 0.71 mg/dL (ref 0.50–1.05)
Globulin: 3 g/dL (calc) (ref 1.9–3.7)
Glucose, Bld: 122 mg/dL — ABNORMAL HIGH (ref 65–99)
Potassium: 4.3 mmol/L (ref 3.5–5.3)
Sodium: 140 mmol/L (ref 135–146)
Total Bilirubin: 0.2 mg/dL (ref 0.2–1.2)
Total Protein: 7 g/dL (ref 6.1–8.1)
eGFR: 95 mL/min/{1.73_m2} (ref >=60)

## 2020-10-30 ENCOUNTER — Other Ambulatory Visit: Payer: Self-pay

## 2020-10-30 ENCOUNTER — Encounter: Payer: Self-pay | Admitting: Internal Medicine

## 2020-10-30 ENCOUNTER — Ambulatory Visit: Payer: 59 | Admitting: Internal Medicine

## 2020-10-30 VITALS — BP 118/80 | HR 69 | Temp 98.6°F | Ht 63.0 in | Wt 185.0 lb

## 2020-10-30 DIAGNOSIS — Z0001 Encounter for general adult medical examination with abnormal findings: Secondary | ICD-10-CM | POA: Diagnosis not present

## 2020-10-30 DIAGNOSIS — F419 Anxiety disorder, unspecified: Secondary | ICD-10-CM | POA: Diagnosis not present

## 2020-10-30 DIAGNOSIS — R739 Hyperglycemia, unspecified: Secondary | ICD-10-CM | POA: Diagnosis not present

## 2020-10-30 DIAGNOSIS — E559 Vitamin D deficiency, unspecified: Secondary | ICD-10-CM | POA: Diagnosis not present

## 2020-10-30 DIAGNOSIS — E538 Deficiency of other specified B group vitamins: Secondary | ICD-10-CM

## 2020-10-30 DIAGNOSIS — R7989 Other specified abnormal findings of blood chemistry: Secondary | ICD-10-CM

## 2020-10-30 DIAGNOSIS — E7800 Pure hypercholesterolemia, unspecified: Secondary | ICD-10-CM

## 2020-10-30 LAB — PTH, INTACT AND CALCIUM
Calcium: 10.7 mg/dL — ABNORMAL HIGH (ref 8.6–10.4)
PTH: 89 pg/mL — ABNORMAL HIGH (ref 16–77)

## 2020-10-30 MED ORDER — CHOLECALCIFEROL 50 MCG (2000 UT) PO TABS: ORAL_TABLET | ORAL | 99 refills | Status: DC

## 2020-10-30 MED ORDER — ATORVASTATIN CALCIUM 20 MG PO TABS: 20.0000 mg | ORAL_TABLET | Freq: Every day | ORAL | 3 refills | Status: DC

## 2020-10-30 NOTE — Patient Instructions (Addendum)
Please take OTC Vitamin D3 at 2000 units per day, indefinitely  Please continue all other medications as before, and refills have been done if requested.  Please have the pharmacy call with any other refills you may need.  Please continue your efforts at being more active, low cholesterol diet, and weight control.  You are otherwise up to date with prevention measures today.  Please keep your appointments with your specialists as you may have planned  Please make an Appointment to return for your 1 year visit, or sooner if needed, with Lab testing by Appointment as well, to be done about 3-5 days before at the [facility] (so this is for TWO appointments - please see the scheduling desk as you leave)   Due to the ongoing Covid 19 pandemic, our lab now requires an appointment for any labs done at our office.  If you need labs done and do not have an appointment, please call our office ahead of time to schedule before presenting to the lab for your testing.

## 2020-10-30 NOTE — Progress Notes (Signed)
Patient ID: Gabrielle Benson, female   DOB: 24-Mar-1955, 65 y.o.   MRN: [id number]         Chief Complaint:: wellness exam and low vit d, hld, hyperglycemia       HPI:  Gabrielle Benson is a 65 y.o. female here for wellness exam; up to date at this time.                          Also not taking vit d.  Pt denies chest pain, increased sob or doe, wheezing, orthopnea, PND, increased LE swelling, palpitations, dizziness or syncope.   Pt denies polydipsia, polyuria, or new focal neuro s/s.   Pt denies fever, wt loss, night sweats, loss of appetite, or other constitutional symptoms  no other new complaints    Wt Readings from Last 3 Encounters:  10/30/20 185 lb (83.9 kg)  05/05/20 189 lb 14.4 oz (86.1 kg)  11/05/19 187 lb (84.8 kg)   BP Readings from Last 3 Encounters:  10/30/20 118/80  05/05/20 134/86  11/05/19 (!) 155/97   Immunization History  Administered Date(s) Administered   Influenza Whole 11/22/2006   Influenza, High Dose Seasonal PF 04/15/2017   Influenza,inj,Quad PF,6+ Mos 10/29/2020   Influenza-Unspecified 11/22/2016   PFIZER(Purple Top)SARS-COV-2 Vaccination 04/28/2019, 05/21/2019, 06/29/2019, 02/23/2020   Td 01/19/1992, 09/06/2014   Tdap 07/01/2011   Zoster Recombinat (Shingrix) 10/23/2019, 01/04/2020  There are no preventive care reminders to display for this patient.    Past Medical History:  Diagnosis Date   Allergic rhinitis    ANA positive    Anemia    Iron deficiency   Anxiety    Cervicalgia    Chronic   Complication of anesthesia    hard to go to sleep with Anesthesia-had to have Benadryl with Colonoscopy   DDD (degenerative disc disease)    Cervical   DJD (degenerative joint disease)    Fibromyalgia    GERD (gastroesophageal reflux disease)    Hyperlipidemia    Multinodular thyroid    Neuropathy, peripheral    Pneumonia    Vitamin D deficiency 03/18/2011   Past Surgical History:  Procedure Laterality Date   ABDOMINAL HYSTERECTOMY      ABDOMINAL HYSTERECTOMY     partial   BUNIONECTOMY  2011   left foot   DILATION AND CURETTAGE OF UTERUS     KNEE ARTHROSCOPY  2004   right   MYOMECTOMY  2002   prior to hysterectomy   PARTIAL KNEE ARTHROPLASTY Right 10/07/2014   Procedure: RIGHT UNI KNEE ARTHROPLASTY LATERALLY ;  Surgeon: Paralee Cancel, MD;  Location: [facility];  Service: Orthopedics;  Laterality: Right;   TONSILLECTOMY     age 31   TOTAL SHOULDER ARTHROPLASTY Left 04/28/2017    reports that she has never smoked. She has never used smokeless tobacco. She reports current alcohol use of about 3.0 - 4.0 standard drinks per week. She reports that she does not use drugs. family history includes Colon cancer in an other family member; Diabetes in her mother; Healthy in her daughter and son; Heart disease in her father; Lung cancer in an other family member; Sickle cell trait in an other family member. Allergies  Allergen Reactions   Ampicillin    Duloxetine     REACTION: dizzy, nervous   Penicillins     Has patient had a PCN reaction causing immediate rash, facial/tongue/throat swelling, SOB or lightheadedness with hypotension: No Has patient had a PCN reaction causing severe  rash involving mucus membranes or skin necrosis: No Has patient had a PCN reaction that required hospitalization: No Has patient had a PCN reaction occurring within the last 10 years: No If all of the above answers are "NO", then may proceed with Cephalosporin use.     Current Outpatient Medications on File Prior to Visit  Medication Sig Dispense Refill   azelastine (ASTELIN) 0.1 % nasal spray Place into both nostrils 2 (two) times daily. Use in each nostril as directed     azelastine (OPTIVAR) 0.05 % ophthalmic solution 1 drop 2 (two) times daily.     chlorpheniramine-HYDROcodone (TUSSIONEX PENNKINETIC ER) 10-8 MG/5ML SUER Take 2.5-5 mLs by mouth every 12 (twelve) hours as needed for cough. 115 mL 0   diclofenac sodium (VOLTAREN) 1 % GEL Apply 2-4 grams  to affected joint up to 4 times a day as needed 4 Tube 2   levocetirizine (XYZAL) 5 MG tablet Take 5 mg by mouth every evening.     LINZESS 290 MCG CAPS capsule TAKE 1 CAPSULE BY MOUTH EVERY DAY BEFORE BREAKFAST 30 capsule 1   Menthol, Topical Analgesic, (BIOFREEZE EX) Apply 1 application topically daily as needed (joint pain).      methocarbamol (ROBAXIN) 500 MG tablet Take 1 tablet (500 mg total) by mouth daily as needed. 90 tablet 0   naproxen sodium (ALEVE) 220 MG tablet Take 220 mg by mouth as needed.     pantoprazole (PROTONIX) 40 MG tablet TAKE 1 TABLET BY MOUTH EVERY DAY 90 tablet 3   pregabalin (LYRICA) 150 MG capsule Take 1 capsule (150 mg total) by mouth 2 (two) times daily. 180 capsule 0   No current facility-administered medications on file prior to visit.        ROS:  All others reviewed and negative.  Objective        PE:  BP 118/80 (BP Location: Right Arm, Patient Position: Sitting, Cuff Size: Large)   Pulse 69   Temp 98.6 F (37 C) (Oral)   Ht 5\' 3"  (1.6 m)   Wt 185 lb (83.9 kg)   SpO2 99%   BMI 32.77 kg/m                 Constitutional: Pt appears in NAD               HENT: Head: NCAT.                Right Ear: External ear normal.                 Left Ear: External ear normal.                Eyes: . Pupils are equal, round, and reactive to light. Conjunctivae and EOM are normal               Nose: without d/c or deformity               Neck: Neck supple. Gross normal ROM               Cardiovascular: Normal rate and regular rhythm.                 Pulmonary/Chest: Effort normal and breath sounds without rales or wheezing.                Abd:  Soft, NT, ND, + BS, no organomegaly               Neurological:  Pt is alert. At baseline orientation, motor grossly intact               Skin: Skin is warm. No rashes, no other new lesions, LE edema - none               Psychiatric: Pt behavior is normal without agitation   Micro: none  Cardiac tracings I have  personally interpreted today:  none  Pertinent Radiological findings (summarize): none   Lab Results  Component Value Date   WBC 4.1 10/27/2020   HGB 11.1 (L) 10/27/2020   HCT 33.9 (L) 10/27/2020   PLT 293.0 10/27/2020   GLUCOSE 122 (H) 10/27/2020   CHOL 216 (H) 10/27/2020   TRIG 93.0 10/27/2020   HDL 60.80 10/27/2020   LDLDIRECT 122.7 07/01/2011   LDLCALC 137 (H) 10/27/2020   ALT 13 10/27/2020   AST 20 10/27/2020   NA 140 10/27/2020   K 4.3 10/27/2020   CL 104 10/27/2020   CREATININE 0.71 10/27/2020   BUN 14 10/27/2020   CO2 25 10/27/2020   TSH 1.61 10/27/2020   INR 0.94 10/01/2014   HGBA1C 6.0 10/27/2020   Assessment/Plan:  Harjot Zavadil is a 65 y.o. Black or African American [2] female with  has a past medical history of Allergic rhinitis, ANA positive, Anemia, Anxiety, Cervicalgia, Complication of anesthesia, DDD (degenerative disc disease), DJD (degenerative joint disease), Fibromyalgia, GERD (gastroesophageal reflux disease), Hyperlipidemia, Multinodular thyroid, Neuropathy, peripheral, Pneumonia, and Vitamin D deficiency (03/18/2011).  Vitamin D deficiency Last vitamin D Lab Results  Component Value Date   VD25OH 11.40 (L) 10/27/2020   Low, to start oral replacement   Encounter for well adult exam with abnormal findings Age and sex appropriate education and counseling updated with regular exercise and diet Referrals for preventative services - none needed Immunizations addressed - none needed Smoking counseling  - none needed Evidence for depression or other mood disorder - none significant Most recent labs reviewed. I have personally reviewed and have noted: 1) the patient's medical and social history 2) The patient's current medications and supplements 3) The patient's height, weight, and BMI have been recorded in the chart   Hyperlipidemia Lab Results  Component Value Date   LDLCALC 137 (H) 10/27/2020   Uncontrolled, goal ldl < 100,, pt to  restart statin lipitor 20 mg daily   Hyperglycemia Lab Results  Component Value Date   HGBA1C 6.0 10/27/2020   Stable, pt to continue current medical treatment   - diet   Anxiety Overall stable, declines need for change in tx or counseling at this time  Followup: Return in about 1 year (around 10/30/2021).  Cathlean Cower, MD 11/01/2020 9:40 PM [facility]

## 2020-10-30 NOTE — Assessment & Plan Note (Signed)
Last vitamin D Lab Results  Component Value Date   VD25OH 11.40 (L) 10/27/2020   Low, to start oral replacement

## 2020-11-01 ENCOUNTER — Encounter: Payer: Self-pay | Admitting: Internal Medicine

## 2020-11-01 NOTE — Addendum Note (Signed)
Addended by: [name] on: 11/01/2020 09:43 PM   Modules accepted: Orders

## 2020-11-01 NOTE — Assessment & Plan Note (Signed)
Lab Results  Component Value Date   HGBA1C 6.0 10/27/2020   Stable, pt to continue current medical treatment   - diet

## 2020-11-01 NOTE — Assessment & Plan Note (Signed)

## 2020-11-01 NOTE — Assessment & Plan Note (Signed)
Lab Results  Component Value Date   LDLCALC 137 (H) 10/27/2020   Uncontrolled, goal ldl < 100,, pt to restart statin lipitor 20 mg daily

## 2020-11-01 NOTE — Assessment & Plan Note (Signed)
Overall stable, declines need for change in tx or counseling at this time

## 2020-11-03 ENCOUNTER — Telehealth: Payer: Self-pay | Admitting: Internal Medicine

## 2020-11-03 ENCOUNTER — Telehealth: Payer: Self-pay

## 2020-11-03 NOTE — Telephone Encounter (Signed)
Left message for patient to call me back. 

## 2020-11-03 NOTE — Telephone Encounter (Signed)
Patient returning call  Please call patient at work before 5pm [phone]

## 2020-11-03 NOTE — Telephone Encounter (Signed)
Patient coming to pick up form tomorrow.

## 2020-11-03 NOTE — Telephone Encounter (Signed)
See previous encounter

## 2020-11-04 ENCOUNTER — Ambulatory Visit: Payer: 59 | Admitting: Rheumatology

## 2020-11-04 DIAGNOSIS — M797 Fibromyalgia: Secondary | ICD-10-CM

## 2020-11-04 DIAGNOSIS — M5136 Other intervertebral disc degeneration, lumbar region: Secondary | ICD-10-CM

## 2020-11-04 DIAGNOSIS — F5101 Primary insomnia: Secondary | ICD-10-CM

## 2020-11-04 DIAGNOSIS — M7061 Trochanteric bursitis, right hip: Secondary | ICD-10-CM

## 2020-11-04 DIAGNOSIS — M5030 Other cervical disc degeneration, unspecified cervical region: Secondary | ICD-10-CM

## 2020-11-04 DIAGNOSIS — Z13820 Encounter for screening for osteoporosis: Secondary | ICD-10-CM

## 2020-11-04 DIAGNOSIS — Z8739 Personal history of other diseases of the musculoskeletal system and connective tissue: Secondary | ICD-10-CM

## 2020-11-04 DIAGNOSIS — Z87898 Personal history of other specified conditions: Secondary | ICD-10-CM

## 2020-11-04 DIAGNOSIS — Z96651 Presence of right artificial knee joint: Secondary | ICD-10-CM

## 2020-11-04 DIAGNOSIS — M1712 Unilateral primary osteoarthritis, left knee: Secondary | ICD-10-CM

## 2020-11-04 DIAGNOSIS — Z8639 Personal history of other endocrine, nutritional and metabolic disease: Secondary | ICD-10-CM

## 2020-11-04 DIAGNOSIS — Z8669 Personal history of other diseases of the nervous system and sense organs: Secondary | ICD-10-CM

## 2020-11-04 DIAGNOSIS — R5383 Other fatigue: Secondary | ICD-10-CM

## 2020-11-04 DIAGNOSIS — Z8659 Personal history of other mental and behavioral disorders: Secondary | ICD-10-CM

## 2020-11-11 NOTE — Progress Notes (Signed)
Office Visit Note  Patient: Gabrielle Benson             Date of Birth: Sep 19, 1955           MRN: [id number]             PCP: [name], MD Referring: [name], MD Visit Date: 11/25/2020 Occupation: @GUAROCC @  Subjective:  Trochanteric bursitis of both hips   History of Present Illness: Gabrielle Benson is a 65 y.o. female with history of fibromyalgia, osteoarthritis, and DDD.  She takes lyrica 150 mg BID and robaxin 500 mg 1 tablet daily as needed for muscle spasms.  She continues to have internal myalgias and muscle tenderness due to fibromyalgia.  She has ongoing discomfort due to trochanter bursitis of both hips.  She requested cortisone injections bilaterally.  She states that she was helping her daughter move this past weekend and was lifting heavy objects when she should have been.  She states recently having some increased neck pain and stiffness as well as trapezius muscle spasms on the right side.  She states that massage and heat have been alleviating her symptoms.  She is also been using Voltaren gel topically as needed.  She experiences underwent discomfort in both feet due to neuropathy.  She continues to follow-up with Dr. Caralyn Guile for left wrist pain.  She has an upcoming left wrist cortisone injection scheduled.  She denies any joint swelling.  She states she had a DEXA updated in October 2022 at [place].     Activities of Daily Living:  Patient reports morning stiffness for 4 hours.   Patient Reports nocturnal pain.  Difficulty dressing/grooming: Denies Difficulty climbing stairs: Denies Difficulty getting out of chair: Denies Difficulty using hands for taps, buttons, cutlery, and/or writing: Denies  Review of Systems  Constitutional:  Positive for fatigue.  HENT:  Positive for mouth dryness. Negative for mouth sores and nose dryness.   Eyes:  Positive for dryness. Negative for pain and visual disturbance.  Respiratory:  Negative for cough, hemoptysis and  difficulty breathing.   Cardiovascular:  Negative for chest pain, palpitations, hypertension and swelling in legs/feet.  Gastrointestinal:  Positive for constipation. Negative for blood in stool and diarrhea.  Endocrine: Negative for increased urination.  Genitourinary:  Negative for difficulty urinating and painful urination.  Musculoskeletal:  Positive for joint pain, joint pain, myalgias, muscle weakness, morning stiffness, muscle tenderness and myalgias. Negative for joint swelling.  Skin:  Negative for color change, pallor, rash, hair loss, nodules/bumps, skin tightness, ulcers and sensitivity to sunlight.  Allergic/Immunologic: Negative for susceptible to infections.  Neurological:  Positive for numbness. Negative for dizziness and headaches.  Hematological:  Positive for bruising/bleeding tendency. Negative for swollen glands.  Psychiatric/Behavioral:  Positive for sleep disturbance. Negative for depressed mood. The patient is not nervous/anxious.    PMFS History:  Patient Active Problem List   Diagnosis Date Noted   Hypercalcemia 10/23/2019   Acute upper respiratory infection 09/30/2017   Hyperglycemia 10/14/2016   DJD (degenerative joint disease), cervical 10/05/2016   DDD (degenerative disc disease), lumbar 10/05/2016   Primary osteoarthritis of left knee 10/05/2016   Acute left-sided low back pain 03/25/2016   Pain, joint, multiple sites 03/25/2016   History of insomnia 03/25/2016   Greater trochanteric bursitis of both hips 03/25/2016   Wheezing 02/18/2016   Chronic pain syndrome 10/11/2015   Memory dysfunction 04/01/2015   Status post right partial knee replacement 10/07/2014   Cough 11/02/2013   Herpes labialis 11/02/2013  GERD (gastroesophageal reflux disease) 11/02/2012   Anxiety 07/01/2011   Vitamin D deficiency 03/18/2011   Encounter for well adult exam with abnormal findings 03/13/2011   PERIPHERAL NEUROPATHY 10/03/2007   Hyperlipidemia 01/05/2007    ANEMIA-IRON DEFICIENCY 01/05/2007   INSOMNIA-SLEEP DISORDER-UNSPEC 01/05/2007   Allergic rhinitis 01/05/2007   Irritable bowel syndrome with constipation 01/05/2007   Fibromyalgia syndrome 01/05/2007    Past Medical History:  Diagnosis Date   Allergic rhinitis    ANA positive    Anemia    Iron deficiency   Anxiety    Cervicalgia    Chronic   Complication of anesthesia    hard to go to sleep with Anesthesia-had to have Benadryl with Colonoscopy   DDD (degenerative disc disease)    Cervical   DJD (degenerative joint disease)    Fibromyalgia    GERD (gastroesophageal reflux disease)    Hyperlipidemia    Multinodular thyroid    Neuropathy, peripheral    Pneumonia    Vitamin D deficiency 03/18/2011    Family History  Problem Relation Age of Onset   Heart disease Father    Sickle cell trait Other    Lung cancer Other    Colon cancer Other        Aunt   Diabetes Mother    Healthy Daughter    Healthy Son    Stomach cancer Neg Hx    Rectal cancer Neg Hx    Esophageal cancer Neg Hx    Past Surgical History:  Procedure Laterality Date   ABDOMINAL HYSTERECTOMY     ABDOMINAL HYSTERECTOMY     partial   BUNIONECTOMY  2011   left foot   DILATION AND CURETTAGE OF UTERUS     KNEE ARTHROSCOPY  2004   right   MYOMECTOMY  2002   prior to hysterectomy   PARTIAL KNEE ARTHROPLASTY Right 10/07/2014   Procedure: RIGHT UNI KNEE ARTHROPLASTY LATERALLY ;  Surgeon: Paralee Cancel, MD;  Location: [facility];  Service: Orthopedics;  Laterality: Right;   TONSILLECTOMY     age 50   TOTAL SHOULDER ARTHROPLASTY Left 04/28/2017   Social History   Social History Narrative   Married 2 kids   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  Administered Date(s) Administered   Influenza Whole 11/22/2006   Influenza, High Dose Seasonal PF 04/15/2017   Influenza,inj,Quad PF,6+ Mos 10/29/2020   Influenza-Unspecified 11/22/2016   PFIZER(Purple Top)SARS-COV-2 Vaccination 04/28/2019, 05/21/2019,  06/29/2019, 02/23/2020   Td 01/19/1992, 09/06/2014   Tdap 07/01/2011   Zoster Recombinat (Shingrix) 10/23/2019, 01/04/2020     Objective: Vital Signs: BP (!) 142/89 (BP Location: Left Arm, Patient Position: Sitting, Cuff Size: Normal)   Pulse 73   Resp 16   Ht 5\' 2"  (1.575 m)   Wt 187 lb (84.8 kg)   BMI 34.20 kg/m    Physical Exam Vitals and nursing note reviewed.  Constitutional:      Appearance: She is well-developed.  HENT:     Head: Normocephalic and atraumatic.  Eyes:     Conjunctiva/sclera: Conjunctivae normal.  Pulmonary:     Effort: Pulmonary effort is normal.  Abdominal:     Palpations: Abdomen is soft.  Musculoskeletal:     Cervical back: Normal range of motion.  Skin:    General: Skin is warm and dry.     Capillary Refill: Capillary refill takes less than 2 seconds.  Neurological:     Mental Status: She is alert and oriented to person, place, and time.  Psychiatric:        Behavior: Behavior normal.     Musculoskeletal Exam: C-spine has limited ROM with lateral rotation.  Trapezius muscle tension and tenderness bilaterally.  Thoracic and lumbar spine have good range of motion.  No midline spinal tenderness or SI joint tenderness.  Shoulder joints, elbow joints, wrist joints, MCPs, PIPs, DIPs have good range of motion with no synovitis.  Some tenderness on the ulnar aspect of the left wrist.  Complete fist formation bilaterally.  PIP and DIP thickening consistent with osteoarthritis of both hands noted.  Hip joints have good range of motion with no groin pain.  Tenderness over bilateral trochanteric bursa.  Right partial knee replacement has good range of motion with warmth but no effusion.  Left knee joint has good range of motion with no warmth or effusion.  Ankle joints have good range of motion with no tenderness or joint swelling.  No tenderness over MTP joints.  CDAI Exam: CDAI Score: -- Patient Global: --; Provider Global: -- Swollen: --; Tender: -- Joint  Exam 11/25/2020   No joint exam has been documented for this visit   There is currently no information documented on the homunculus. Go to the Rheumatology activity and complete the homunculus joint exam.  Investigation: No additional findings.  Imaging: No results found.  Recent Labs: Lab Results  Component Value Date   WBC 4.1 10/27/2020   HGB 11.1 (L) 10/27/2020   PLT 293.0 10/27/2020   NA 140 10/27/2020   K 4.3 10/27/2020   CL 104 10/27/2020   CO2 25 10/27/2020   GLUCOSE 122 (H) 10/27/2020   BUN 14 10/27/2020   CREATININE 0.71 10/27/2020   BILITOT 0.2 10/27/2020   ALKPHOS 102 11/09/2019   AST 20 10/27/2020   ALT 13 10/27/2020   PROT 7.0 10/27/2020   ALBUMIN 4.2 11/09/2019   CALCIUM 10.7 (H) 10/27/2020   GFRAA >60 08/28/2016    Speciality Comments: No specialty comments available.  Procedures:  Large Joint Inj: bilateral greater trochanter on 11/25/2020 9:07 AM Indications: pain Details: 27 G 1.5 in needle, lateral approach  Arthrogram: No  Medications (Right): 1.5 mL lidocaine 1 %; 40 mg triamcinolone acetonide 40 MG/ML Aspirate (Right): 0 mL Medications (Left): 1.5 mL lidocaine 1 %; 40 mg triamcinolone acetonide 40 MG/ML Aspirate (Left): 0 mL Outcome: tolerated well, no immediate complications Procedure, treatment alternatives, risks and benefits explained, specific risks discussed. Consent was given by the patient. Immediately prior to procedure a time out was called to verify the correct patient, procedure, equipment, support staff and site/side marked as required. Patient was prepped and draped in the usual sterile fashion.    Allergies: Ampicillin, Duloxetine, and Penicillins   Assessment / Plan:     Visit Diagnoses: Fibromyalgia syndrome - She experiences intermittent myalgias and muscle tenderness.  She is currently on Lyrica 150 mg 1 capsule by mouth twice daily and methocarbamol 500 mg po qd prn.  She continues to find the current treatment regimen  to be effective at managing her symptoms.  She presents today with trochanter bursitis of both hips which has been causing discomfort while sitting for prolonged periods of time as well as at night.  She requested cortisone injections today.  She tolerated procedure well.  Procedure note was completed above.  Aftercare was discussed.  Discussed the importance of performing stretching exercises on a daily basis.  She is planning on renewing her [employer] membership to increase her activity level.  Discussed the importance of  regular exercise and good sleep hygiene. She will follow-up in the office in 6 months.  Primary insomnia: She experiences occasional nocturnal pain which contributes to insomnia.  Discussed the importance of good sleep hygiene.  Other fatigue: Discussed the importance of regular exercise and good sleep hygiene.  She is planning on renewing her [employer] membership to increase her activity level.  Primary osteoarthritis of left knee: She has good range of motion of the left knee joint with no discomfort.  No warmth or effusion was noted.  She has been walking on a regular basis for exercise.  Status post right partial knee replacement: Doing well.  She has good range of motion with no discomfort.  Warmth but no effusion noted.  Greater trochanteric bursitis of both hips: She presents today with discomfort due to trochanter bursitis of both hips.  She has been experiencing pain with sitting for prolonged peers of time as well as lying on her sides at night.  She has tenderness over bilateral trochanteric bursa on examination today.  She has good range of motion of both hip joints with no groin pain.  She requested bilateral cortisone injections today.  She tolerated the procedure well.  Procedure note was completed above.  Aftercare was discussed.  Discussed the importance of performing stretching exercises on a daily basis.  If her discomfort persists or worsens we can proceed with physical  therapy in the future.  DDD (degenerative disc disease), cervical: She has slightly limited range of motion with lateral rotation.  Trapezius muscle tension and tenderness noted bilaterally, right greater than left.  Discussed the importance of performing stretching exercises on a daily basis.  We also discussed getting massages on a regular basis for myofascial release.   DDD (degenerative disc disease), lumbar: No midline spinal tenderness or SI joint tenderness.  No symptoms of radiculopathy.  History of chronic pain: She remains on Lyrica as prescribed.  History of neuropathy - She has longstanding history of neuropathy and had been followed by Dr. Erling Cruz in the past.  Other medical conditions are listed as follows:  History of scoliosis  History of vitamin D deficiency - she has vitamin D deficiency elevated PTH and hypercalcemia.  She has been followed by Dr. Jenny Reichmann.  History of anxiety  Osteoporosis screening - Patient updated DEX[facility] at [facility] in October 2022 per patient.    Orders: Orders Placed This Encounter  Procedures   Large Joint Inj    No orders of the defined types were placed in this encounter.     Follow-Up Instructions: Return in about 6 months (around 05/25/2021) for Fibromyalgia, Osteoarthritis, DDD.   Gabrielle Neas, PA-C  Note - This record has been created using Dragon software.  Chart creation errors have been sought, but may not always  have been located. Such creation errors do not reflect on  the standard of medical care.

## 2020-11-25 ENCOUNTER — Ambulatory Visit: Payer: 59 | Admitting: Physician Assistant

## 2020-11-25 ENCOUNTER — Encounter: Payer: Self-pay | Admitting: Physician Assistant

## 2020-11-25 ENCOUNTER — Other Ambulatory Visit: Payer: Self-pay

## 2020-11-25 VITALS — BP 128/88 | HR 73 | Resp 16 | Ht 62.0 in | Wt 187.0 lb

## 2020-11-25 DIAGNOSIS — Z8659 Personal history of other mental and behavioral disorders: Secondary | ICD-10-CM

## 2020-11-25 DIAGNOSIS — M7061 Trochanteric bursitis, right hip: Secondary | ICD-10-CM | POA: Diagnosis not present

## 2020-11-25 DIAGNOSIS — M7062 Trochanteric bursitis, left hip: Secondary | ICD-10-CM | POA: Diagnosis not present

## 2020-11-25 DIAGNOSIS — Z13820 Encounter for screening for osteoporosis: Secondary | ICD-10-CM

## 2020-11-25 DIAGNOSIS — M5030 Other cervical disc degeneration, unspecified cervical region: Secondary | ICD-10-CM

## 2020-11-25 DIAGNOSIS — R5383 Other fatigue: Secondary | ICD-10-CM | POA: Diagnosis not present

## 2020-11-25 DIAGNOSIS — M1712 Unilateral primary osteoarthritis, left knee: Secondary | ICD-10-CM

## 2020-11-25 DIAGNOSIS — Z8739 Personal history of other diseases of the musculoskeletal system and connective tissue: Secondary | ICD-10-CM

## 2020-11-25 DIAGNOSIS — F5101 Primary insomnia: Secondary | ICD-10-CM

## 2020-11-25 DIAGNOSIS — M797 Fibromyalgia: Secondary | ICD-10-CM

## 2020-11-25 DIAGNOSIS — Z8669 Personal history of other diseases of the nervous system and sense organs: Secondary | ICD-10-CM

## 2020-11-25 DIAGNOSIS — Z96651 Presence of right artificial knee joint: Secondary | ICD-10-CM

## 2020-11-25 DIAGNOSIS — M5136 Other intervertebral disc degeneration, lumbar region: Secondary | ICD-10-CM

## 2020-11-25 DIAGNOSIS — Z87898 Personal history of other specified conditions: Secondary | ICD-10-CM

## 2020-11-25 DIAGNOSIS — Z8639 Personal history of other endocrine, nutritional and metabolic disease: Secondary | ICD-10-CM

## 2020-11-25 DIAGNOSIS — M51369 Other intervertebral disc degeneration, lumbar region without mention of lumbar back pain or lower extremity pain: Secondary | ICD-10-CM

## 2020-11-25 MED ORDER — TRIAMCINOLONE ACETONIDE 40 MG/ML IJ SUSP
40.0000 mg | INTRAMUSCULAR | Status: AC | PRN
  Administered 2020-11-25: 40 mg via INTRA_ARTICULAR

## 2020-11-25 MED ORDER — LIDOCAINE HCL 1 % IJ SOLN
1.5000 mL | INTRAMUSCULAR | Status: AC | PRN
  Administered 2020-11-25: 1.5 mL

## 2020-11-28 ENCOUNTER — Telehealth (INDEPENDENT_AMBULATORY_CARE_PROVIDER_SITE_OTHER): Payer: 59 | Admitting: Physician Assistant

## 2020-11-28 DIAGNOSIS — J069 Acute upper respiratory infection, unspecified: Secondary | ICD-10-CM

## 2020-11-28 DIAGNOSIS — R051 Acute cough: Secondary | ICD-10-CM | POA: Diagnosis not present

## 2020-11-28 MED ORDER — HYDROCOD POLST-CPM POLST ER 10-8 MG/5ML PO SUER: 5.0000 mL | Freq: Every evening | ORAL | 0 refills | Status: DC | PRN

## 2020-11-28 MED ORDER — AZITHROMYCIN 250 MG PO TABS: ORAL_TABLET | ORAL | 0 refills | Status: AC

## 2020-11-28 NOTE — Progress Notes (Signed)
Virtual Visit via Video Note  I connected with  [name]  on 11/28/20 at 11:30 AM EST by a video enabled telemedicine application and verified that I am speaking with the correct person using two identifiers.  Location: Patient: home Provider: Therapist, music at [place] present: Patient and myself   I discussed the limitations of evaluation and management by telemedicine and the availability of in person appointments. The patient expressed understanding and agreed to proceed.   History of Present Illness:  Chief complaint: Cough Symptom onset: 3 days ago  Pertinent positives: Hx of bronchitis, states Z-pak & Tussionex the only medications that help Pertinent negatives: Fever, chills, SOB, N/V/D, congestoin Treatments tried: Severe Cough and Cold OTC, Delsym, Azelastine nasal spray  Vaccine status: Pfizer x 4, flu shot UTD Sick exposure: No sick contacts     Observations/Objective:   Gen: Awake, alert, no acute distress Resp: Breathing is even and non-labored Psych: calm/pleasant demeanor Neuro: Alert and Oriented x 3, + facial symmetry, speech is clear.   Assessment and Plan:  1. Acute upper respiratory infection 2. Acute cough -Recurrent annually with change in seasons usually -She will continue conservative tx at home such as Delsym, Mucinex, nasal saline -Z-pak and Tussionex for added relief. -Recheck in person if worse or no improvement     Follow Up Instructions:    I discussed the assessment and treatment plan with the patient. The patient was provided an opportunity to ask questions and all were answered. The patient agreed with the plan and demonstrated an understanding of the instructions.   The patient was advised to call back or seek an in-person evaluation if the symptoms worsen or if the condition fails to improve as anticipated.  Labria Wos M Jenilyn Magana, PA-C

## 2020-12-06 ENCOUNTER — Other Ambulatory Visit: Payer: Self-pay | Admitting: Internal Medicine

## 2020-12-22 ENCOUNTER — Other Ambulatory Visit: Payer: Self-pay | Admitting: Internal Medicine

## 2020-12-22 NOTE — Telephone Encounter (Signed)
Please refill as per office routine med refill policy (all routine meds to be refilled for 3 mo or monthly (per pt preference) up to one year from last visit, then month to month grace period for 3 mo, then further med refills will have to be denied) ? ?

## 2021-01-17 ENCOUNTER — Other Ambulatory Visit: Payer: Self-pay | Admitting: Internal Medicine

## 2021-05-12 NOTE — Progress Notes (Signed)
? ?Office Visit Note ? ?Patient: Gabrielle Benson             ?Date of Birth: 1955/08/18           ?MRN: [id number]             ?PCP: [name], MD ?Referring: [name], MD ?Visit Date: 05/26/2021 ?Occupation: '@GUAROCC'$ @ ? ?Subjective:  ?Pain in left hand ? ?History of Present Illness: Gabrielle Benson is a 66 y.o. female history of fibromyalgia, osteoarthritis and degenerative disc disease.  She states she continues to have some generalized pain and discomfort from fibromyalgia.  She continues to have some discomfort in her bilateral trochanteric bursae and her knee joints.  She had right knee partial replacement in the past.  She has been having some discomfort in her left hand.  She is that Dr. Caralyn Guile has been giving her injections in her left hand.  He gets about 3 injections/year.  She takes methocarbamol only 500 mg as needed.  She has reduced the dose of Lyrica 250 mg twice a day. ? ?Activities of Daily Living:  ?Patient reports morning stiffness for 3-4 hours.   ?Patient Reports nocturnal pain.  ?Difficulty dressing/grooming: Denies ?Difficulty climbing stairs: Denies ?Difficulty getting out of chair: Denies ?Difficulty using hands for taps, buttons, cutlery, and/or writing: Reports ? ?Review of Systems  ?Constitutional:  Positive for fatigue.  ?HENT:  Negative for mouth sores, mouth dryness and nose dryness.   ?Eyes:  Positive for dryness. Negative for pain and itching.  ?Respiratory:  Negative for shortness of breath and difficulty breathing.   ?Cardiovascular:  Negative for palpitations.  ?Gastrointestinal:  Positive for constipation. Negative for blood in stool and diarrhea.  ?Endocrine: Negative for increased urination.  ?Genitourinary:  Negative for difficulty urinating.  ?Musculoskeletal:  Positive for joint pain, joint pain, myalgias, morning stiffness, muscle tenderness and myalgias. Negative for joint swelling.  ?Skin:  Negative for color change, rash and redness.   ?Allergic/Immunologic: Negative for susceptible to infections.  ?Neurological:  Positive for numbness and weakness. Negative for dizziness, headaches and memory loss.  ?Hematological:  Positive for bruising/bleeding tendency.  ?Psychiatric/Behavioral:  Negative for confusion.   ? ?PMFS History:  ?Patient Active Problem List  ? Diagnosis Date Noted  ? Hypercalcemia 10/23/2019  ? Acute upper respiratory infection 09/30/2017  ? Hyperglycemia 10/14/2016  ? DJD (degenerative joint disease), cervical 10/05/2016  ? DDD (degenerative disc disease), lumbar 10/05/2016  ? Primary osteoarthritis of left knee 10/05/2016  ? Acute left-sided low back pain 03/25/2016  ? Pain, joint, multiple sites 03/25/2016  ? History of insomnia 03/25/2016  ? Greater trochanteric bursitis of both hips 03/25/2016  ? Wheezing 02/18/2016  ? Chronic pain syndrome 10/11/2015  ? Memory dysfunction 04/01/2015  ? Status post right partial knee replacement 10/07/2014  ? Cough 11/02/2013  ? Herpes labialis 11/02/2013  ? GERD (gastroesophageal reflux disease) 11/02/2012  ? Anxiety 07/01/2011  ? Vitamin D deficiency 03/18/2011  ? Encounter for well adult exam with abnormal findings 03/13/2011  ? PERIPHERAL NEUROPATHY 10/03/2007  ? Hyperlipidemia 01/05/2007  ? ANEMIA-IRON DEFICIENCY 01/05/2007  ? INSOMNIA-SLEEP DISORDER-UNSPEC 01/05/2007  ? Allergic rhinitis 01/05/2007  ? Irritable bowel syndrome with constipation 01/05/2007  ? Fibromyalgia syndrome 01/05/2007  ?  ?Past Medical History:  ?Diagnosis Date  ? Allergic rhinitis   ? ANA positive   ? Anemia   ? Iron deficiency  ? Anxiety   ? Cervicalgia   ? Chronic  ? Complication of anesthesia   ? hard  to go to sleep with Anesthesia-had to have Benadryl with Colonoscopy  ? DDD (degenerative disc disease)   ? Cervical  ? DJD (degenerative joint disease)   ? Fibromyalgia   ? GERD (gastroesophageal reflux disease)   ? Hyperlipidemia   ? Multinodular thyroid   ? Neuropathy, peripheral   ? Pneumonia   ? Vitamin D  deficiency 03/18/2011  ?  ?Family History  ?Problem Relation Age of Onset  ? Heart disease Father   ? Sickle cell trait Other   ? Lung cancer Other   ? Colon cancer Other   ?     Aunt  ? Diabetes Mother   ? Healthy Daughter   ? Healthy Son   ? Stomach cancer Neg Hx   ? Rectal cancer Neg Hx   ? Esophageal cancer Neg Hx   ? ?Past Surgical History:  ?Procedure Laterality Date  ? ABDOMINAL HYSTERECTOMY    ? ABDOMINAL HYSTERECTOMY    ? partial  ? BUNIONECTOMY  2011  ? left foot  ? DILATION AND CURETTAGE OF UTERUS    ? KNEE ARTHROSCOPY  2004  ? right  ? MYOMECTOMY  2002  ? prior to hysterectomy  ? PARTIAL KNEE ARTHROPLASTY Right 10/07/2014  ? Procedure: RIGHT UNI KNEE ARTHROPLASTY LATERALLY ;  Surgeon: Paralee Cancel, MD;  Location: [facility];  Service: Orthopedics;  Laterality: Right;  ? TONSILLECTOMY    ? age 68  ? TOTAL SHOULDER ARTHROPLASTY Left 04/28/2017  ? ?Social History  ? ?Social History Narrative  ? Married 2 kids  ? Receptionist at [employer]  ? ?Immunization History  ?Administered Date(s) Administered  ? Influenza Whole 11/22/2006  ? Influenza, High Dose Seasonal PF 04/15/2017  ? Influenza,inj,Quad PF,6+ Mos 10/29/2020  ? Influenza-Unspecified 11/22/2016  ? PFIZER(Purple Top)SARS-COV-2 Vaccination 04/28/2019, 05/21/2019, 06/29/2019, 02/23/2020  ? Td 01/19/1992, 09/06/2014  ? Tdap 07/01/2011  ? Zoster Recombinat (Shingrix) 10/23/2019, 01/04/2020  ?  ? ?Objective: ?Vital Signs: BP 138/88 (BP Location: Right Arm, Patient Position: Sitting, Cuff Size: Normal)   Pulse 67   Ht '5\' 3"'$  (1.6 m)   Wt 184 lb 3.2 oz (83.6 kg)   BMI 32.63 kg/m?   ? ?Physical Exam ?Vitals and nursing note reviewed.  ?Constitutional:   ?   Appearance: She is well-developed.  ?HENT:  ?   Head: Normocephalic and atraumatic.  ?Eyes:  ?   Conjunctiva/sclera: Conjunctivae normal.  ?Cardiovascular:  ?   Rate and Rhythm: Normal rate and regular rhythm.  ?   Heart sounds: Normal heart sounds.  ?Pulmonary:  ?   Effort: Pulmonary effort is normal.  ?   Breath  sounds: Normal breath sounds.  ?Abdominal:  ?   General: Bowel sounds are normal.  ?   Palpations: Abdomen is soft.  ?Musculoskeletal:  ?   Cervical back: Normal range of motion.  ?Lymphadenopathy:  ?   Cervical: No cervical adenopathy.  ?Skin: ?   General: Skin is warm and dry.  ?   Capillary Refill: Capillary refill takes less than 2 seconds.  ?Neurological:  ?   Mental Status: She is alert and oriented to person, place, and time.  ?Psychiatric:     ?   Behavior: Behavior normal.  ?  ? ?Musculoskeletal Exam: C-spine was in good range of motion.  She had limited range of motion of her lumbar spine.  Shoulder joints, elbow joints, wrist joints with good range of motion with no synovitis.  There was no tenderness over MCPs or PIPs.  Hip joints  and knee joints with good range of motion with no synovitis.  There was no tenderness over ankles or MTPs.  Right knee joint with partial replacement. ? ?CDAI Exam: ?CDAI Score: -- ?Patient Global: --; Provider Global: -- ?Swollen: --; Tender: -- ?Joint Exam 05/26/2021  ? ?No joint exam has been documented for this visit  ? ?There is currently no information documented on the homunculus. Go to the Rheumatology activity and complete the homunculus joint exam. ? ?Investigation: ?No additional findings. ? ?Imaging: ?No results found. ? ?Recent Labs: ?Lab Results  ?Component Value Date  ? WBC 4.1 10/27/2020  ? HGB 11.1 (L) 10/27/2020  ? PLT 293.0 10/27/2020  ? NA 140 10/27/2020  ? K 4.3 10/27/2020  ? CL 104 10/27/2020  ? CO2 25 10/27/2020  ? GLUCOSE 122 (H) 10/27/2020  ? BUN 14 10/27/2020  ? CREATININE 0.71 10/27/2020  ? BILITOT 0.2 10/27/2020  ? ALKPHOS 102 11/09/2019  ? AST 20 10/27/2020  ? ALT 13 10/27/2020  ? PROT 7.0 10/27/2020  ? ALBUMIN 4.2 11/09/2019  ? CALCIUM 10.7 (H) 10/27/2020  ? GFRAA >60 08/28/2016  ? ? ?Speciality Comments: No specialty comments available. ? ?Procedures:  ?No procedures performed ?Allergies: Ampicillin, Duloxetine, and Penicillins  ? ?Assessment /  Plan:     ?Visit Diagnoses: Fibromyalgia syndrome -she continues to have fibromyalgia flares off-and-on.  She has been having generalized pain and discomfort.  She had positive tender points.  She has been tapering her medications

## 2021-05-26 ENCOUNTER — Encounter: Payer: Self-pay | Admitting: Rheumatology

## 2021-05-26 ENCOUNTER — Ambulatory Visit: Payer: 59 | Admitting: Rheumatology

## 2021-05-26 VITALS — BP 138/88 | HR 67 | Ht 63.0 in | Wt 184.2 lb

## 2021-05-26 DIAGNOSIS — Z8639 Personal history of other endocrine, nutritional and metabolic disease: Secondary | ICD-10-CM

## 2021-05-26 DIAGNOSIS — M7062 Trochanteric bursitis, left hip: Secondary | ICD-10-CM

## 2021-05-26 DIAGNOSIS — Z8659 Personal history of other mental and behavioral disorders: Secondary | ICD-10-CM

## 2021-05-26 DIAGNOSIS — M1712 Unilateral primary osteoarthritis, left knee: Secondary | ICD-10-CM | POA: Diagnosis not present

## 2021-05-26 DIAGNOSIS — Z87898 Personal history of other specified conditions: Secondary | ICD-10-CM

## 2021-05-26 DIAGNOSIS — Z8669 Personal history of other diseases of the nervous system and sense organs: Secondary | ICD-10-CM

## 2021-05-26 DIAGNOSIS — R5383 Other fatigue: Secondary | ICD-10-CM | POA: Diagnosis not present

## 2021-05-26 DIAGNOSIS — Z13820 Encounter for screening for osteoporosis: Secondary | ICD-10-CM

## 2021-05-26 DIAGNOSIS — M797 Fibromyalgia: Secondary | ICD-10-CM | POA: Diagnosis not present

## 2021-05-26 DIAGNOSIS — M5030 Other cervical disc degeneration, unspecified cervical region: Secondary | ICD-10-CM

## 2021-05-26 DIAGNOSIS — F5101 Primary insomnia: Secondary | ICD-10-CM | POA: Diagnosis not present

## 2021-05-26 DIAGNOSIS — Z8739 Personal history of other diseases of the musculoskeletal system and connective tissue: Secondary | ICD-10-CM

## 2021-05-26 DIAGNOSIS — M7061 Trochanteric bursitis, right hip: Secondary | ICD-10-CM

## 2021-05-26 DIAGNOSIS — M5136 Other intervertebral disc degeneration, lumbar region: Secondary | ICD-10-CM

## 2021-05-26 DIAGNOSIS — Z96651 Presence of right artificial knee joint: Secondary | ICD-10-CM

## 2021-10-29 ENCOUNTER — Other Ambulatory Visit (INDEPENDENT_AMBULATORY_CARE_PROVIDER_SITE_OTHER): Payer: 59

## 2021-10-29 DIAGNOSIS — E559 Vitamin D deficiency, unspecified: Secondary | ICD-10-CM | POA: Diagnosis not present

## 2021-10-29 DIAGNOSIS — E7800 Pure hypercholesterolemia, unspecified: Secondary | ICD-10-CM | POA: Diagnosis not present

## 2021-10-29 DIAGNOSIS — R739 Hyperglycemia, unspecified: Secondary | ICD-10-CM

## 2021-10-29 DIAGNOSIS — E538 Deficiency of other specified B group vitamins: Secondary | ICD-10-CM

## 2021-10-29 DIAGNOSIS — R7989 Other specified abnormal findings of blood chemistry: Secondary | ICD-10-CM

## 2021-10-30 LAB — URINALYSIS, ROUTINE W REFLEX MICROSCOPIC
Bilirubin Urine: NEGATIVE
Hgb urine dipstick: NEGATIVE
Ketones, ur: NEGATIVE
Nitrite: NEGATIVE
Specific Gravity, Urine: 1.01 (ref 1.000–1.030)
Total Protein, Urine: NEGATIVE
Urine Glucose: NEGATIVE
Urobilinogen, UA: 0.2 (ref 0.0–1.0)
pH: 6 (ref 5.0–8.0)

## 2021-10-30 LAB — VITAMIN B12: Vitamin B-12: 438 pg/mL (ref 211–911)

## 2021-10-30 LAB — LIPID PANEL
Cholesterol: 162 mg/dL (ref 0–200)
HDL: 69.6 mg/dL (ref >39.00)
LDL Cholesterol: 70 mg/dL (ref 0–99)
NonHDL: 92.05
Total CHOL/HDL Ratio: 2
Triglycerides: 112 mg/dL (ref 0.0–149.0)
VLDL: 22.4 mg/dL (ref 0.0–40.0)

## 2021-10-30 LAB — BASIC METABOLIC PANEL
BUN: 11 mg/dL (ref 6–23)
CO2: 27 mEq/L (ref 19–32)
Calcium: 10.7 mg/dL — ABNORMAL HIGH (ref 8.4–10.5)
Chloride: 102 mEq/L (ref 96–112)
Creatinine, Ser: 0.64 mg/dL (ref 0.40–1.20)
GFR: 93.44 mL/min (ref >60.00)
Glucose, Bld: 116 mg/dL — ABNORMAL HIGH (ref 70–99)
Potassium: 4.2 mEq/L (ref 3.5–5.1)
Sodium: 136 mEq/L (ref 135–145)

## 2021-10-30 LAB — CBC WITH DIFFERENTIAL/PLATELET
Basophils Absolute: 0.1 10*3/uL (ref 0.0–0.1)
Basophils Relative: 1.3 % (ref 0.0–3.0)
Eosinophils Absolute: 0.1 10*3/uL (ref 0.0–0.7)
Eosinophils Relative: 1.2 % (ref 0.0–5.0)
HCT: 34.8 % — ABNORMAL LOW (ref 36.0–46.0)
Hemoglobin: 11.2 g/dL — ABNORMAL LOW (ref 12.0–15.0)
Lymphocytes Relative: 35.7 % (ref 12.0–46.0)
Lymphs Abs: 2 10*3/uL (ref 0.7–4.0)
MCHC: 32.2 g/dL (ref 30.0–36.0)
MCV: 82.9 fl (ref 78.0–100.0)
Monocytes Absolute: 0.4 10*3/uL (ref 0.1–1.0)
Monocytes Relative: 8 % (ref 3.0–12.0)
Neutro Abs: 3 10*3/uL (ref 1.4–7.7)
Neutrophils Relative %: 53.8 % (ref 43.0–77.0)
Platelets: 325 10*3/uL (ref 150.0–400.0)
RBC: 4.19 Mil/uL (ref 3.87–5.11)
RDW: 13.3 % (ref 11.5–15.5)
WBC: 5.5 10*3/uL (ref 4.0–10.5)

## 2021-10-30 LAB — HEPATIC FUNCTION PANEL
ALT: 19 U/L (ref 0–35)
AST: 27 U/L (ref 0–37)
Albumin: 4.3 g/dL (ref 3.5–5.2)
Alkaline Phosphatase: 99 U/L (ref 39–117)
Bilirubin, Direct: 0 mg/dL (ref 0.0–0.3)
Total Bilirubin: 0.3 mg/dL (ref 0.2–1.2)
Total Protein: 7.7 g/dL (ref 6.0–8.3)

## 2021-10-30 LAB — HEMOGLOBIN A1C: Hgb A1c MFr Bld: 6.2 % (ref 4.6–6.5)

## 2021-10-30 LAB — PTH, INTACT AND CALCIUM
Calcium: 10.6 mg/dL — ABNORMAL HIGH (ref 8.6–10.4)
PTH: 129 pg/mL — ABNORMAL HIGH (ref 16–77)

## 2021-10-30 LAB — TSH: TSH: 0.95 u[IU]/mL (ref 0.35–5.50)

## 2021-10-30 LAB — VITAMIN D 25 HYDROXY (VIT D DEFICIENCY, FRACTURES): VITD: 20.41 ng/mL — ABNORMAL LOW (ref 30.00–100.00)

## 2021-11-02 ENCOUNTER — Encounter: Payer: Self-pay | Admitting: Internal Medicine

## 2021-11-02 ENCOUNTER — Ambulatory Visit (INDEPENDENT_AMBULATORY_CARE_PROVIDER_SITE_OTHER): Payer: 59 | Admitting: Internal Medicine

## 2021-11-02 VITALS — BP 124/82 | HR 67 | Temp 97.9°F | Ht 63.0 in | Wt 190.0 lb

## 2021-11-02 DIAGNOSIS — R7989 Other specified abnormal findings of blood chemistry: Secondary | ICD-10-CM

## 2021-11-02 DIAGNOSIS — E7800 Pure hypercholesterolemia, unspecified: Secondary | ICD-10-CM

## 2021-11-02 DIAGNOSIS — E538 Deficiency of other specified B group vitamins: Secondary | ICD-10-CM

## 2021-11-02 DIAGNOSIS — F32A Depression, unspecified: Secondary | ICD-10-CM | POA: Insufficient documentation

## 2021-11-02 DIAGNOSIS — Z0001 Encounter for general adult medical examination with abnormal findings: Secondary | ICD-10-CM

## 2021-11-02 DIAGNOSIS — E559 Vitamin D deficiency, unspecified: Secondary | ICD-10-CM

## 2021-11-02 DIAGNOSIS — R739 Hyperglycemia, unspecified: Secondary | ICD-10-CM

## 2021-11-02 NOTE — Assessment & Plan Note (Signed)
Lab Results  Component Value Date   LDLCALC 70 10/29/2021   Stable, pt to continue current statin lipitor 20 mg qd

## 2021-11-02 NOTE — Assessment & Plan Note (Signed)
Mild situational, pt declines referral counseling or med tx

## 2021-11-02 NOTE — Patient Instructions (Addendum)
Please take OTC Vitamin D3 at 2000 units per day, indefinitely  Please continue all other medications as before, and refills have been done if requested.  Please have the pharmacy call with any other refills you may need.  Please continue your efforts at being more active, low cholesterol diet, and weight control.  You are otherwise up to date with prevention measures today.  Please keep your appointments with your specialists as you may have planned  Please make an Appointment to return for your 1 year visit, or sooner if needed, with Lab testing by Appointment as well, to be done about 3-5 days before at the [facility] (so this is for TWO appointments - please see the scheduling desk as you leave)

## 2021-11-02 NOTE — Assessment & Plan Note (Signed)
Last vitamin D Lab Results  Component Value Date   VD25OH 20.41 (L) 10/29/2021   Low, to start  oral replacement

## 2021-11-02 NOTE — Progress Notes (Signed)
Patient ID: Gabrielle Benson, female   DOB: January 21, 1955, 66 y.o.   MRN: [id number]         Chief Complaint:: wellness exam and depression, low vit d, elevated PTH, hyperglycemia       HPI:  Gabrielle Benson is a 66 y.o. female here for wellness exam; declines covid booster, has flu shot at work coming in 2 days, o/w up to date.                          Also has has mild worsening depression related to recent stress, but no HI or SI and declines treatment or counseling referral today.  Pt denies chest pain, increased sob or doe, wheezing, orthopnea, PND, increased LE swelling, palpitations, dizziness or syncope.   Pt denies polydipsia, polyuria, or new focal neuro s/s.    Pt denies fever, night sweats, loss of appetite, or other constitutional symptoms   Wt up to 190, has been binge eating recently gained over 10 lbs, worry about personal finance, son is helping her.  Has not been taking VIt D Wt Readings from Last 3 Encounters:  11/02/21 190 lb (86.2 kg)  05/26/21 184 lb 3.2 oz (83.6 kg)  11/25/20 187 lb (84.8 kg)   BP Readings from Last 3 Encounters:  11/02/21 124/82  05/26/21 138/88  11/25/20 128/88   Immunization History  Administered Date(s) Administered   Influenza Whole 11/22/2006   Influenza, High Dose Seasonal PF 04/15/2017   Influenza,inj,Quad PF,6+ Mos 10/29/2020   Influenza-Unspecified 11/22/2016, 10/18/2020   PFIZER(Purple Top)SARS-COV-2 Vaccination 04/28/2019, 05/21/2019, 06/29/2019, 02/23/2020   Td 01/19/1992, 09/06/2014   Tdap 07/01/2011   Zoster Recombinat (Shingrix) 10/23/2019, 01/04/2020   There are no preventive care reminders to display for this patient.     Past Medical History:  Diagnosis Date   Allergic rhinitis    ANA positive    Anemia    Iron deficiency   Anxiety    Cervicalgia    Chronic   Complication of anesthesia    hard to go to sleep with Anesthesia-had to have Benadryl with Colonoscopy   DDD (degenerative disc disease)    Cervical    DJD (degenerative joint disease)    Fibromyalgia    GERD (gastroesophageal reflux disease)    Hyperlipidemia    Multinodular thyroid    Neuropathy, peripheral    Pneumonia    Vitamin D deficiency 03/18/2011   Past Surgical History:  Procedure Laterality Date   ABDOMINAL HYSTERECTOMY     ABDOMINAL HYSTERECTOMY     partial   BUNIONECTOMY  2011   left foot   DILATION AND CURETTAGE OF UTERUS     KNEE ARTHROSCOPY  2004   right   MYOMECTOMY  2002   prior to hysterectomy   PARTIAL KNEE ARTHROPLASTY Right 10/07/2014   Procedure: RIGHT UNI KNEE ARTHROPLASTY LATERALLY ;  Surgeon: Paralee Cancel, MD;  Location: [facility];  Service: Orthopedics;  Laterality: Right;   TONSILLECTOMY     age 89   TOTAL SHOULDER ARTHROPLASTY Left 04/28/2017    reports that she has never smoked. She has been exposed to tobacco smoke. She has never used smokeless tobacco. She reports current alcohol use of about 3.0 - 4.0 standard drinks of alcohol per week. She reports that she does not use drugs. family history includes Colon cancer in an other family member; Diabetes in her mother; Healthy in her daughter and son; Heart disease in her father; Lung cancer in an  other family member; Sickle cell trait in an other family member. Allergies  Allergen Reactions   Ampicillin    Duloxetine     REACTION: dizzy, nervous   Penicillins     Has patient had a PCN reaction causing immediate rash, facial/tongue/throat swelling, SOB or lightheadedness with hypotension: No Has patient had a PCN reaction causing severe rash involving mucus membranes or skin necrosis: No Has patient had a PCN reaction that required hospitalization: No Has patient had a PCN reaction occurring within the last 10 years: No If all of the above answers are "NO", then may proceed with Cephalosporin use.     Current Outpatient Medications on File Prior to Visit  Medication Sig Dispense Refill   atorvastatin (LIPITOR) 20 MG tablet Take 1 tablet (20 mg  total) by mouth daily. 90 tablet 3   azelastine (ASTELIN) 0.1 % nasal spray Place into both nostrils 2 (two) times daily. Use in each nostril as directed     azelastine (OPTIVAR) 0.05 % ophthalmic solution 1 drop 2 (two) times daily.     chlorpheniramine-HYDROcodone (TUSSIONEX PENNKINETIC ER) 10-8 MG/5ML SUER Take 2.5-5 mLs by mouth every 12 (twelve) hours as needed for cough. 115 mL 0   chlorpheniramine-HYDROcodone (TUSSIONEX PENNKINETIC ER) 10-8 MG/5ML SUER Take 5 mLs by mouth at bedtime as needed for cough. 60 mL 0   Cholecalciferol 50 MCG (2000 UT) TABS 1 tab by mouth once daily 30 tablet 99   diclofenac sodium (VOLTAREN) 1 % GEL Apply 2-4 grams to affected joint up to 4 times a day as needed 4 Tube 2   levocetirizine (XYZAL) 5 MG tablet Take 5 mg by mouth every evening.     LINZESS 290 MCG CAPS capsule TAKE 1 CAPSULE BY MOUTH EVERY DAY BEFORE BREAKFAST 30 capsule 0   Menthol, Topical Analgesic, (BIOFREEZE EX) Apply 1 application topically daily as needed (joint pain).      methocarbamol (ROBAXIN) 500 MG tablet Take 1 tablet (500 mg total) by mouth daily as needed. 90 tablet 0   naproxen sodium (ALEVE) 220 MG tablet Take 220 mg by mouth as needed.     pantoprazole (PROTONIX) 40 MG tablet TAKE 1 TABLET BY MOUTH EVERY DAY 90 tablet 3   pregabalin (LYRICA) 150 MG capsule Take 1 capsule (150 mg total) by mouth 2 (two) times daily. 180 capsule 0   No current facility-administered medications on file prior to visit.        ROS:  All others reviewed and negative.  Objective        PE:  BP 124/82 (BP Location: Right Arm, Patient Position: Sitting, Cuff Size: Normal)   Pulse 67   Temp 97.9 F (36.6 C) (Oral)   Ht '5\' 3"'$  (1.6 m)   Wt 190 lb (86.2 kg)   SpO2 98%   BMI 33.66 kg/m                 Constitutional: Pt appears in NAD               HENT: Head: NCAT.                Right Ear: External ear normal.                 Left Ear: External ear normal.                Eyes: . Pupils are  equal, round, and reactive to light. Conjunctivae and EOM are normal  Nose: without d/c or deformity               Neck: Neck supple. Gross normal ROM               Cardiovascular: Normal rate and regular rhythm.                 Pulmonary/Chest: Effort normal and breath sounds without rales or wheezing.                Abd:  Soft, NT, ND, + BS, no organomegaly               Neurological: Pt is alert. At baseline orientation, motor grossly intact               Skin: Skin is warm. No rashes, no other new lesions, LE edema - none               Psychiatric: Pt behavior is normal without agitation   Micro: none  Cardiac tracings I have personally interpreted today:  none  Pertinent Radiological findings (summarize): none   Lab Results  Component Value Date   WBC 5.5 10/29/2021   HGB 11.2 (L) 10/29/2021   HCT 34.8 (L) 10/29/2021   PLT 325.0 10/29/2021   GLUCOSE 116 (H) 10/29/2021   CHOL 162 10/29/2021   TRIG 112.0 10/29/2021   HDL 69.60 10/29/2021   LDLDIRECT 122.7 07/01/2011   LDLCALC 70 10/29/2021   ALT 19 10/29/2021   AST 27 10/29/2021   NA 136 10/29/2021   K 4.2 10/29/2021   CL 102 10/29/2021   CREATININE 0.64 10/29/2021   BUN 11 10/29/2021   CO2 27 10/29/2021   TSH 0.95 10/29/2021   INR 0.94 10/01/2014   HGBA1C 6.2 10/29/2021   Assessment/Plan:  Larayah Clute is a 66 y.o. Black or African American [2] female with  has a past medical history of Allergic rhinitis, ANA positive, Anemia, Anxiety, Cervicalgia, Complication of anesthesia, DDD (degenerative disc disease), DJD (degenerative joint disease), Fibromyalgia, GERD (gastroesophageal reflux disease), Hyperlipidemia, Multinodular thyroid, Neuropathy, peripheral, Pneumonia, and Vitamin D deficiency (03/18/2011).  Encounter for well adult exam with abnormal findings .Age and sex appropriate education and counseling updated with regular exercise and diet Referrals for preventative services - none  needed Immunizations addressed - declines covid booster and flu shot Smoking counseling  - none needed Evidence for depression or other mood disorder - mild situational depression - declines med tx or counseling referral Most recent labs reviewed. I have personally reviewed and have noted: 1) the patient's medical and social history 2) The patient's current medications and supplements 3) The patient's height, weight, and BMI have been recorded in the chart   Vitamin D deficiency Last vitamin D Lab Results  Component Value Date   VD25OH 20.41 (L) 10/29/2021   Low, to start  oral replacement   Hyperlipidemia Lab Results  Component Value Date   LDLCALC 70 10/29/2021   Stable, pt to continue current statin lipitor 20 mg qd   Hyperglycemia Lab Results  Component Value Date   HGBA1C 6.2 10/29/2021   Stable, pt to continue current medical treatment  - diet, wt control   Elevated PTHrP level Lab Results  Component Value Date   PTH 129 (H) 10/29/2021   CALCIUM 10.6 (H) 10/29/2021   CALCIUM 10.7 (H) 10/29/2021   Mild elevated worsening likely due to low VIt D - for vit d3 2000 u qd  Depression Mild situational, pt declines referral counseling  or med tx  Followup: Return in about 1 year (around 11/03/2022).  Cathlean Cower, MD 11/02/2021 2:34 PM [facility]

## 2021-11-02 NOTE — Assessment & Plan Note (Signed)
.  Age and sex appropriate education and counseling updated with regular exercise and diet Referrals for preventative services - none needed Immunizations addressed - declines covid booster and flu shot Smoking counseling  - none needed Evidence for depression or other mood disorder - mild situational depression - declines med tx or counseling referral Most recent labs reviewed. I have personally reviewed and have noted: 1) the patient's medical and social history 2) The patient's current medications and supplements 3) The patient's height, weight, and BMI have been recorded in the chart

## 2021-11-02 NOTE — Assessment & Plan Note (Signed)
Lab Results  Component Value Date   PTH 129 (H) 10/29/2021   CALCIUM 10.6 (H) 10/29/2021   CALCIUM 10.7 (H) 10/29/2021   Mild elevated worsening likely due to low VIt D - for vit d3 2000 u qd

## 2021-11-02 NOTE — Assessment & Plan Note (Signed)
Lab Results  Component Value Date   HGBA1C 6.2 10/29/2021   Stable, pt to continue current medical treatment  - diet, wt control

## 2021-11-07 LAB — HM MAMMOGRAPHY

## 2021-11-11 ENCOUNTER — Telehealth: Payer: Self-pay | Admitting: Internal Medicine

## 2021-11-11 ENCOUNTER — Telehealth: Payer: 59 | Admitting: Emergency Medicine

## 2021-11-11 DIAGNOSIS — J069 Acute upper respiratory infection, unspecified: Secondary | ICD-10-CM

## 2021-11-11 MED ORDER — AZELASTINE HCL 0.1 % NA SOLN: 2.0000 | Freq: Two times a day (BID) | NASAL | 0 refills | Status: AC

## 2021-11-11 MED ORDER — BENZONATATE 100 MG PO CAPS: 100.0000 mg | ORAL_CAPSULE | Freq: Two times a day (BID) | ORAL | 0 refills | Status: DC | PRN

## 2021-11-11 NOTE — Progress Notes (Signed)
E-Visit for Upper Respiratory Infection   We are sorry you are not feeling well.  Here is how we plan to help!  Based on what you have shared with me, it looks like you may have a viral upper respiratory infection.  Upper respiratory infections are caused by a large number of viruses; however, rhinovirus is the most common cause.   Antibiotics are not recommended by the Infectious Disease Society of America guidelines.   Symptoms vary from person to person, with common symptoms including sore throat, cough, fatigue or lack of energy and feeling of general discomfort.  A low-grade fever of up to 100.4 may present, but is often uncommon.  Symptoms vary however, and are closely related to a person's age or underlying illnesses.  The most common symptoms associated with an upper respiratory infection are nasal discharge or congestion, cough, sneezing, headache and pressure in the ears and face.  These symptoms usually persist for about 3 to 10 days, but can last up to 2 weeks.  It is important to know that upper respiratory infections do not cause serious illness or complications in most cases.    Upper respiratory infections can be transmitted from person to person, with the most common method of transmission being a person's hands.  The virus is able to live on the skin and can infect other persons for up to 2 hours after direct contact.  Also, these can be transmitted when someone coughs or sneezes; thus, it is important to cover the mouth to reduce this risk.  To keep the spread of the illness at [place], good hand hygiene is very important.  This is an infection that is most likely caused by a virus. There are no specific treatments other than to help you with the symptoms until the infection runs its course.  We are sorry you are not feeling well.  Here is how we plan to help!   For nasal congestion, you may use an oral decongestants such as Mucinex D or if you have glaucoma or high blood pressure use  plain Mucinex.    Saline nasal spray or nasal drops can help and can safely be used as often as needed for congestion.  Or try using saline irrigation, such as with a neti pot, several times a day while you are sick. Many neti pots come with salt packets premeasured to use to make saline. If you use your own salt, make sure it is kosher salt or sea salt (don't use table salt as it has iodine in it and you don't need that in your nose). Use distilled water to make saline. If you mix your own saline using your own salt, the recipe is 1/4 teaspoon salt in 1 cup warm water. Using saline irrigation can help prevent and treat sinus infections.   For your congestion, I have prescribed Azelastine nasal spray two sprays in each nostril twice a day  If you do not have a history of heart disease, hypertension, diabetes or thyroid disease, prostate/bladder issues or glaucoma, you may also use Sudafed to treat nasal congestion.  It is highly recommended that you consult with a pharmacist or your primary care physician to ensure this medication is safe for you to take.     If you have a cough, you may use cough suppressants such as Delsym and Robitussin.  If you have glaucoma or high blood pressure, you can also use Coricidin HBP.   For cough I have prescribed for you A  prescription cough medication called Tessalon Perles 100 mg. You may take 1-2 capsules every 8 hours as needed for cough  If you have a sore or scratchy throat, use a saltwater gargle-  to  teaspoon of salt dissolved in a 4-ounce to 8-ounce glass of warm water.  Gargle the solution for approximately 15-30 seconds and then spit.  It is important not to swallow the solution.  You can also use throat lozenges/cough drops and Chloraseptic spray to help with throat pain or discomfort.  Warm or cold liquids can also be helpful in relieving throat pain.  For headache, pain or general discomfort, you can use Ibuprofen or Tylenol as directed.   Some  authorities believe that zinc sprays or the use of Echinacea may shorten the course of your symptoms.   HOME CARE Only take medications as instructed by your medical team. Be sure to drink plenty of fluids. Water is fine as well as fruit juices, sodas and electrolyte beverages. You may want to stay away from caffeine or alcohol. If you are nauseated, try taking small sips of liquids. How do you know if you are getting enough fluid? Your urine should be a pale yellow or almost colorless. Get rest. Taking a steamy shower or using a humidifier may help nasal congestion and ease sore throat pain. You can place a towel over your head and breathe in the steam from hot water coming from a faucet. Using a saline nasal spray works much the same way. Cough drops, hard candies and sore throat lozenges may ease your cough. Avoid close contacts especially the very young and the elderly Cover your mouth if you cough or sneeze Always remember to wash your hands.   GET HELP RIGHT AWAY IF: You develop worsening fever. If your symptoms do not improve within 10 days You develop yellow or green discharge from your nose over 3 days. You have coughing fits You develop a severe head ache or visual changes. You develop shortness of breath, difficulty breathing or start having chest pain Your symptoms persist after you have completed your treatment plan  MAKE SURE YOU  Understand these instructions. Will watch your condition. Will get help right away if you are not doing well or get worse.  Thank you for choosing an e-visit.  Your e-visit answers were reviewed by a board certified advanced clinical practitioner to complete your personal care plan. Depending upon the condition, your plan could have included both over the counter or prescription medications.  Please review your pharmacy choice. Make sure the pharmacy is open so you can pick up prescription now. If there is a problem, you may contact your  provider through [employer] and have the prescription routed to another pharmacy.  Your safety is important to [place]. If you have drug allergies check your prescription carefully.   For the next 24 hours you can use MyChart to ask questions about today's visit, request a non-urgent call back, or ask for a work or school excuse. You will get an email in the next two days asking about your experience. I hope that your e-visit has been valuable and will speed your recovery.  I have spent 5 minutes in review of e-visit questionnaire, review and updating patient chart, medical decision making and response to patient.   Willeen Cass, PhD, FNP-BC

## 2021-11-11 NOTE — Telephone Encounter (Addendum)
Pt called to report symptoms beginning Sunday night and having an E-Visit today. Was prescribed nasal spray. No other meds given.   Pt is requesting cough medication and z-pack. Slight sore throat, congested head, cough, post nasal drip.  Missed work all week due to symptoms.   [facility] [address]

## 2021-11-11 NOTE — Telephone Encounter (Signed)
Very sorry, we normally dont prescribe antibx without exam, and I would have to defer to the provider who actually evaluated her.    thanks

## 2021-11-11 NOTE — Telephone Encounter (Signed)
Patient is requesting new meds for other symptoms

## 2021-11-11 NOTE — Telephone Encounter (Signed)
Please review and call in medication if possible.  Symptoms for 3 days. Unable to work.

## 2021-11-12 ENCOUNTER — Telehealth: Payer: 59 | Admitting: Internal Medicine

## 2021-11-13 ENCOUNTER — Telehealth (INDEPENDENT_AMBULATORY_CARE_PROVIDER_SITE_OTHER): Payer: 59 | Admitting: Internal Medicine

## 2021-11-13 DIAGNOSIS — R739 Hyperglycemia, unspecified: Secondary | ICD-10-CM | POA: Diagnosis not present

## 2021-11-13 DIAGNOSIS — J069 Acute upper respiratory infection, unspecified: Secondary | ICD-10-CM

## 2021-11-13 DIAGNOSIS — J309 Allergic rhinitis, unspecified: Secondary | ICD-10-CM

## 2021-11-13 MED ORDER — AZITHROMYCIN 250 MG PO TABS: ORAL_TABLET | ORAL | 1 refills | Status: AC

## 2021-11-13 MED ORDER — HYDROCODONE BIT-HOMATROP MBR 5-1.5 MG/5ML PO SOLN: 5.0000 mL | Freq: Four times a day (QID) | ORAL | 0 refills | Status: AC | PRN

## 2021-11-13 NOTE — Progress Notes (Unsigned)
Patient ID: Gabrielle Benson, female   DOB: [birth year], 66 y.o.   MRN: [id number]  Virtual Visit via Video Note  I connected with Gabrielle Benson on 11/14/21 at  3:00 PM EDT by a video enabled telemedicine application and verified that I am speaking with the correct person using two identifiers.  Location of all participants today Patient: at home Provider: at office   I discussed the limitations of evaluation and management by telemedicine and the availability of in person appointments. The patient expressed understanding and agreed to proceed.  History of Present Illness:  Here with 2-3 days acute onset fever, facial pain, pressure, headache, general weakness and malaise, and greenish d/c, with mild ST and cough, but pt denies chest pain, wheezing, increased sob or doe, orthopnea, PND, increased LE swelling, palpitations, dizziness or syncope.  Does have several wks ongoing nasal allergy symptoms with clearish congestion, itch and sneezing, without fever, pain, ST, cough, swelling or wheezing.   Pt denies polydipsia, polyuria, or new focal neuro s/s.    Past Medical History:  Diagnosis Date   Allergic rhinitis    ANA positive    Anemia    Iron deficiency   Anxiety    Cervicalgia    Chronic   Complication of anesthesia    hard to go to sleep with Anesthesia-had to have Benadryl with Colonoscopy   DDD (degenerative disc disease)    Cervical   DJD (degenerative joint disease)    Fibromyalgia    GERD (gastroesophageal reflux disease)    Hyperlipidemia    Multinodular thyroid    Neuropathy, peripheral    Pneumonia    Vitamin D deficiency 03/18/2011   Past Surgical History:  Procedure Laterality Date   ABDOMINAL HYSTERECTOMY     ABDOMINAL HYSTERECTOMY     partial   BUNIONECTOMY  2011   left foot   DILATION AND CURETTAGE OF UTERUS     KNEE ARTHROSCOPY  2004   right   MYOMECTOMY  2002   prior to hysterectomy   PARTIAL KNEE ARTHROPLASTY Right 10/07/2014   Procedure:  RIGHT UNI KNEE ARTHROPLASTY LATERALLY ;  Surgeon: Paralee Cancel, MD;  Location: [facility];  Service: Orthopedics;  Laterality: Right;   TONSILLECTOMY     age 51   TOTAL SHOULDER ARTHROPLASTY Left 04/28/2017    reports that she has never smoked. She has been exposed to tobacco smoke. She has never used smokeless tobacco. She reports current alcohol use of about 3.0 - 4.0 standard drinks of alcohol per week. She reports that she does not use drugs. family history includes Colon cancer in an other family member; Diabetes in her mother; Healthy in her daughter and son; Heart disease in her father; Lung cancer in an other family member; Sickle cell trait in an other family member. Allergies  Allergen Reactions   Ampicillin    Duloxetine     REACTION: dizzy, nervous   Penicillins     Has patient had a PCN reaction causing immediate rash, facial/tongue/throat swelling, SOB or lightheadedness with hypotension: No Has patient had a PCN reaction causing severe rash involving mucus membranes or skin necrosis: No Has patient had a PCN reaction that required hospitalization: No Has patient had a PCN reaction occurring within the last 10 years: No If all of the above answers are "NO", then may proceed with Cephalosporin use.     Current Outpatient Medications on File Prior to Visit  Medication Sig Dispense Refill   atorvastatin (LIPITOR) 20 MG tablet Take 1  tablet (20 mg total) by mouth daily. 90 tablet 3   azelastine (ASTELIN) 0.1 % nasal spray Place 2 sprays into both nostrils 2 (two) times daily. Use in each nostril as directed 30 mL 0   azelastine (OPTIVAR) 0.05 % ophthalmic solution 1 drop 2 (two) times daily.     benzonatate (TESSALON) 100 MG capsule Take 1 capsule (100 mg total) by mouth 2 (two) times daily as needed for cough. 20 capsule 0   chlorpheniramine-HYDROcodone (TUSSIONEX PENNKINETIC ER) 10-8 MG/5ML SUER Take 2.5-5 mLs by mouth every 12 (twelve) hours as needed for cough. 115 mL 0    chlorpheniramine-HYDROcodone (TUSSIONEX PENNKINETIC ER) 10-8 MG/5ML SUER Take 5 mLs by mouth at bedtime as needed for cough. 60 mL 0   Cholecalciferol 50 MCG (2000 UT) TABS 1 tab by mouth once daily 30 tablet 99   diclofenac sodium (VOLTAREN) 1 % GEL Apply 2-4 grams to affected joint up to 4 times a day as needed 4 Tube 2   levocetirizine (XYZAL) 5 MG tablet Take 5 mg by mouth every evening.     LINZESS 290 MCG CAPS capsule TAKE 1 CAPSULE BY MOUTH EVERY DAY BEFORE BREAKFAST 30 capsule 0   Menthol, Topical Analgesic, (BIOFREEZE EX) Apply 1 application topically daily as needed (joint pain).      methocarbamol (ROBAXIN) 500 MG tablet Take 1 tablet (500 mg total) by mouth daily as needed. 90 tablet 0   naproxen sodium (ALEVE) 220 MG tablet Take 220 mg by mouth as needed.     pantoprazole (PROTONIX) 40 MG tablet TAKE 1 TABLET BY MOUTH EVERY DAY 90 tablet 3   pregabalin (LYRICA) 150 MG capsule Take 1 capsule (150 mg total) by mouth 2 (two) times daily. 180 capsule 0   No current facility-administered medications on file prior to visit.    Observations/Objective: Alert, NAD, appropriate mood and affect, resps normal, cn 2-12 intact, moves all 4s, no visible rash or swelling Lab Results  Component Value Date   WBC 5.5 10/29/2021   HGB 11.2 (L) 10/29/2021   HCT 34.8 (L) 10/29/2021   PLT 325.0 10/29/2021   GLUCOSE 116 (H) 10/29/2021   CHOL 162 10/29/2021   TRIG 112.0 10/29/2021   HDL 69.60 10/29/2021   LDLDIRECT 122.7 07/01/2011   LDLCALC 70 10/29/2021   ALT 19 10/29/2021   AST 27 10/29/2021   NA 136 10/29/2021   K 4.2 10/29/2021   CL 102 10/29/2021   CREATININE 0.64 10/29/2021   BUN 11 10/29/2021   CO2 27 10/29/2021   TSH 0.95 10/29/2021   INR 0.94 10/01/2014   HGBA1C 6.2 10/29/2021   Assessment and Plan: See notes  Follow Up Instructions: See notes   I discussed the assessment and treatment plan with the patient. The patient was provided an opportunity to ask questions and all  were answered. The patient agreed with the plan and demonstrated an understanding of the instructions.   The patient was advised to call back or seek an in-person evaluation if the symptoms worsen or if the condition fails to improve as anticipated.   Cathlean Cower, MD

## 2021-11-13 NOTE — Progress Notes (Unsigned)
Office Visit Note  Patient: Gabrielle Benson             Date of Birth: 06-Jun-1955           MRN: [id number]             PCP: [name], MD Referring: [name], MD Visit Date: 11/26/2021 Occupation: '@GUAROCC'$ @  Subjective:  Intermittent arthralgias   History of Present Illness: Gabrielle Benson is a 66 y.o. female with history of fibromyalgia, osteoarthritis, and DDD. Patient continues to experience intermittent arthalgias and myalgias from underlying OA and fibromyalgia.  She states overall her symptoms have been manageable.  She has been stretching on a daily basis which has been helpful for alleviating her discomfort from trochanteric bursitis.  She has rejoined the [facility] and plans on increasing her exercise regimen.  She plans on also working on weight loss.  She states her energy level has been stable.  She has been sleeping better at night and has been getting about 6-6.5 hours of sleep per night.  She has been taking robaxin very sparingly as needed for muscle spasms.  She remains on lyrica as prescribed.      Activities of Daily Living:  Patient reports morning stiffness for 2-3 hours.   Patient Reports nocturnal pain.  Difficulty dressing/grooming: Denies Difficulty climbing stairs: Denies Difficulty getting out of chair: Denies Difficulty using hands for taps, buttons, cutlery, and/or writing: Denies  Review of Systems  Constitutional:  Positive for fatigue.  HENT:  Positive for mouth dryness. Negative for mouth sores and nose dryness.   Eyes:  Positive for dryness. Negative for pain and visual disturbance.  Respiratory:  Negative for cough, hemoptysis and difficulty breathing.   Cardiovascular:  Negative for chest pain, palpitations, hypertension and swelling in legs/feet.  Gastrointestinal:  Positive for constipation. Negative for blood in stool and diarrhea.  Endocrine: Negative for increased urination.  Genitourinary:  Negative for painful urination and  involuntary urination.  Musculoskeletal:  Positive for joint pain, joint pain, joint swelling, myalgias, muscle weakness, morning stiffness, muscle tenderness and myalgias. Negative for gait problem.  Skin:  Negative for color change, pallor, rash, hair loss, nodules/bumps, skin tightness, ulcers and sensitivity to sunlight.  Allergic/Immunologic: Negative for susceptible to infections.  Neurological:  Negative for dizziness, numbness, headaches and weakness.  Hematological:  Negative for swollen glands.  Psychiatric/Behavioral:  Positive for sleep disturbance. Negative for depressed mood. The patient is nervous/anxious.     PMFS History:  Patient Active Problem List   Diagnosis Date Noted   Elevated PTHrP level 11/02/2021   Depression 11/02/2021   Hypercalcemia 10/23/2019   Acute upper respiratory infection 09/30/2017   Hyperglycemia 10/14/2016   DJD (degenerative joint disease), cervical 10/05/2016   DDD (degenerative disc disease), lumbar 10/05/2016   Primary osteoarthritis of left knee 10/05/2016   Acute left-sided low back pain 03/25/2016   Pain, joint, multiple sites 03/25/2016   History of insomnia 03/25/2016   Greater trochanteric bursitis of both hips 03/25/2016   Wheezing 02/18/2016   Chronic pain syndrome 10/11/2015   Memory dysfunction 04/01/2015   Status post right partial knee replacement 10/07/2014   Cough 11/02/2013   Herpes labialis 11/02/2013   GERD (gastroesophageal reflux disease) 11/02/2012   Anxiety 07/01/2011   Vitamin D deficiency 03/18/2011   Encounter for well adult exam with abnormal findings 03/13/2011   PERIPHERAL NEUROPATHY 10/03/2007   Hyperlipidemia 01/05/2007   ANEMIA-IRON DEFICIENCY 01/05/2007   INSOMNIA-SLEEP DISORDER-UNSPEC 01/05/2007   Allergic rhinitis 01/05/2007  Irritable bowel syndrome with constipation 01/05/2007   Fibromyalgia syndrome 01/05/2007    Past Medical History:  Diagnosis Date   Allergic rhinitis    ANA positive     Anemia    Iron deficiency   Anxiety    Cervicalgia    Chronic   Complication of anesthesia    hard to go to sleep with Anesthesia-had to have Benadryl with Colonoscopy   DDD (degenerative disc disease)    Cervical   DJD (degenerative joint disease)    Fibromyalgia    GERD (gastroesophageal reflux disease)    Hyperlipidemia    Multinodular thyroid    Neuropathy, peripheral    Pneumonia    Vitamin D deficiency 03/18/2011    Family History  Problem Relation Age of Onset   Heart disease Father    Sickle cell trait Other    Lung cancer Other    Colon cancer Other        Aunt   Diabetes Mother    Healthy Daughter    Healthy Son    Stomach cancer Neg Hx    Rectal cancer Neg Hx    Esophageal cancer Neg Hx    Past Surgical History:  Procedure Laterality Date   ABDOMINAL HYSTERECTOMY     ABDOMINAL HYSTERECTOMY     partial   BUNIONECTOMY  2011   left foot   DILATION AND CURETTAGE OF UTERUS     KNEE ARTHROSCOPY  2004   right   MYOMECTOMY  2002   prior to hysterectomy   PARTIAL KNEE ARTHROPLASTY Right 10/07/2014   Procedure: RIGHT UNI KNEE ARTHROPLASTY LATERALLY ;  Surgeon: Paralee Cancel, MD;  Location: [facility];  Service: Orthopedics;  Laterality: Right;   TONSILLECTOMY     age 70   TOTAL SHOULDER ARTHROPLASTY Left 04/28/2017   Social History   Social History Narrative   Married 2 kids   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  Administered Date(s) Administered   Influenza Whole 11/22/2006   Influenza, High Dose Seasonal PF 04/15/2017   Influenza,inj,Quad PF,6+ Mos 10/29/2020   Influenza-Unspecified 11/22/2016, 10/18/2020   PFIZER(Purple Top)SARS-COV-2 Vaccination 04/28/2019, 05/21/2019, 06/29/2019, 02/23/2020   Td 01/19/1992, 09/06/2014   Tdap 07/01/2011   Zoster Recombinat (Shingrix) 10/23/2019, 01/04/2020     Objective: Vital Signs: BP (!) 141/85 (BP Location: Left Arm, Patient Position: Sitting, Cuff Size: Normal)   Pulse 69   Resp 16   Ht 5' 3.5" (1.613  m)   Wt 189 lb (85.7 kg)   BMI 32.95 kg/m    Physical Exam Vitals and nursing note reviewed.  Constitutional:      Appearance: She is well-developed.  HENT:     Head: Normocephalic and atraumatic.  Eyes:     Conjunctiva/sclera: Conjunctivae normal.  Cardiovascular:     Rate and Rhythm: Normal rate and regular rhythm.     Heart sounds: Normal heart sounds.  Pulmonary:     Effort: Pulmonary effort is normal.     Breath sounds: Normal breath sounds.  Abdominal:     General: Bowel sounds are normal.     Palpations: Abdomen is soft.  Musculoskeletal:     Cervical back: Normal range of motion.  Lymphadenopathy:     Cervical: No cervical adenopathy.  Skin:    General: Skin is warm and dry.     Capillary Refill: Capillary refill takes less than 2 seconds.  Neurological:     Mental Status: She is alert and oriented to person, place, and time.  Psychiatric:        Behavior: Behavior normal.      Musculoskeletal Exam: Generalized hyperalgesia and positive tender points noted on examination.  C-spine has good range of motion.  Trapezius muscle tension tenderness bilaterally.  Shoulder joints, elbow joints, wrist joints, MCPs, PIPs, DIPs have good range of motion with no synovitis.  Complete fist formation bilaterally.  Hip joints have good range of motion with no groin pain.  Tenderness over bilateral trochanteric bursa.  Right knee partial replacement has good ROM.  Left knee has good ROM with no warmth or effusion. Ankle joints have good range of motion with no tenderness or joint swelling.  CDAI Exam: CDAI Score: -- Patient Global: --; Provider Global: -- Swollen: --; Tender: -- Joint Exam 11/26/2021   No joint exam has been documented for this visit   There is currently no information documented on the homunculus. Go to the Rheumatology activity and complete the homunculus joint exam.  Investigation: No additional findings.  Imaging: No results found.  Recent Labs: Lab  Results  Component Value Date   WBC 5.5 10/29/2021   HGB 11.2 (L) 10/29/2021   PLT 325.0 10/29/2021   NA 136 10/29/2021   K 4.2 10/29/2021   CL 102 10/29/2021   CO2 27 10/29/2021   GLUCOSE 116 (H) 10/29/2021   BUN 11 10/29/2021   CREATININE 0.64 10/29/2021   BILITOT 0.3 10/29/2021   ALKPHOS 99 10/29/2021   AST 27 10/29/2021   ALT 19 10/29/2021   PROT 7.7 10/29/2021   ALBUMIN 4.3 10/29/2021   CALCIUM 10.6 (H) 10/29/2021   CALCIUM 10.7 (H) 10/29/2021   GFRAA >60 08/28/2016    Speciality Comments: No specialty comments available.  Procedures:  No procedures performed Allergies: Ampicillin, Duloxetine, and Penicillins   Assessment / Plan:     Visit Diagnoses: Fibromyalgia syndrome -She has generalized hyperalgesia and positive tender points on examination today.  She continues to experience intermittent myalgias and muscle tenderness due to underlying fibromyalgia.  She has occasional arthralgias due to ongoing osteoarthritis.  She remains on Lyrica 150 mg 1 capsule by mouth twice daily and methocarbamol 500 mg by mouth as needed during flares.  Overall her energy level has been stable.  She has been sleeping better at night.  She has rejoined the [facility] and plans on increasing her activity level.  She also plans on trying to work on weight loss. Discussed the importance of regular exercise, good sleep hygiene, and stress management.  She will follow-up in the office in 6 months or sooner if needed.  Primary insomnia: She has been sleeping better at night.  She has been trying to get at least 6 to 7 hours of sleep per night.   Other fatigue: Chronic, stable.  Discussed the importance of regular exercise.  She has rejoined the [facility] and plans on increasing her exercise regimen.  Primary osteoarthritis of left knee: Good range of motion of the left knee joint.  No warmth or effusion noted.  Status post right partial knee replacement: Doing well.  Full extension on exam.  No warmth or  effusion noted.  Greater trochanteric bursitis of both hips: She continues to have ongoing tenderness upon palpation over bilateral trochanteric bursa.  She has been performing daily stretching exercises which has been alleviating her discomfort.  She had bilateral trochanteric bursa cortisone injections on 11/25/2020 which righted significant relief.  She declined injections today.  She will notify us if her symptoms persist or worsen.  DDD (  degenerative disc disease), cervical: C-spine has good range of motion with no discomfort at this time.  She has some trapezius muscle tension and tenderness upon palpation.  She takes methocarbamol 500 mg 1 tablet as needed during flares to alleviate muscle spasms.  DDD (degenerative disc disease), lumbar: She has not been experiencing any increased discomfort in her lower back.  She has no symptoms of radiculopathy at this time.  History of scoliosis  History of neuropathy - She has longstanding history of neuropathy and had been followed by Dr. Erling Cruz in the past.  History of chronic pain: She remains on Lyrica as prescribed.  History of anxiety  History of vitamin D deficiency -She is taking vitamin D 2000 units daily.  Osteoporosis screening - Patient updated DEXA at [facility] in October 2022 per patient.    Orders: No orders of the defined types were placed in this encounter.  No orders of the defined types were placed in this encounter.     Follow-Up Instructions: Return in about 6 months (around 05/27/2022) for Fibromyalgia, Osteoarthritis, DDD.   Ofilia Neas, PA-C  Note - This record has been created using Dragon software.  Chart creation errors have been sought, but may not always  have been located. Such creation errors do not reflect on  the standard of medical care.

## 2021-11-14 ENCOUNTER — Encounter: Payer: Self-pay | Admitting: Internal Medicine

## 2021-11-14 NOTE — Assessment & Plan Note (Signed)
Lab Results  Component Value Date   HGBA1C 6.2 10/29/2021   Stable, pt to continue current medical treatment  - diet, exercise, wt control

## 2021-11-14 NOTE — Assessment & Plan Note (Signed)
Pt encouraged to take azelstine asd dialy with good compliance

## 2021-11-14 NOTE — Patient Instructions (Signed)
Please take all new medication as prescribed 

## 2021-11-14 NOTE — Assessment & Plan Note (Signed)
Mild to mod, for antibx course zpack,  to f/u any worsening symptoms or concerns 

## 2021-11-17 NOTE — Telephone Encounter (Signed)
Patient had virtual appt and was prescribed cough meds and abx

## 2021-11-26 ENCOUNTER — Ambulatory Visit: Payer: 59 | Attending: Physician Assistant | Admitting: Physician Assistant

## 2021-11-26 ENCOUNTER — Encounter: Payer: Self-pay | Admitting: Physician Assistant

## 2021-11-26 VITALS — BP 141/85 | HR 69 | Resp 16 | Ht 63.5 in | Wt 189.0 lb

## 2021-11-26 DIAGNOSIS — M797 Fibromyalgia: Secondary | ICD-10-CM | POA: Diagnosis not present

## 2021-11-26 DIAGNOSIS — M7061 Trochanteric bursitis, right hip: Secondary | ICD-10-CM

## 2021-11-26 DIAGNOSIS — M7062 Trochanteric bursitis, left hip: Secondary | ICD-10-CM

## 2021-11-26 DIAGNOSIS — R5383 Other fatigue: Secondary | ICD-10-CM

## 2021-11-26 DIAGNOSIS — F5101 Primary insomnia: Secondary | ICD-10-CM

## 2021-11-26 DIAGNOSIS — M1712 Unilateral primary osteoarthritis, left knee: Secondary | ICD-10-CM | POA: Diagnosis not present

## 2021-11-26 DIAGNOSIS — Z8739 Personal history of other diseases of the musculoskeletal system and connective tissue: Secondary | ICD-10-CM

## 2021-11-26 DIAGNOSIS — Z87898 Personal history of other specified conditions: Secondary | ICD-10-CM

## 2021-11-26 DIAGNOSIS — M5030 Other cervical disc degeneration, unspecified cervical region: Secondary | ICD-10-CM

## 2021-11-26 DIAGNOSIS — Z96651 Presence of right artificial knee joint: Secondary | ICD-10-CM

## 2021-11-26 DIAGNOSIS — M5136 Other intervertebral disc degeneration, lumbar region: Secondary | ICD-10-CM

## 2021-11-26 DIAGNOSIS — Z8639 Personal history of other endocrine, nutritional and metabolic disease: Secondary | ICD-10-CM

## 2021-11-26 DIAGNOSIS — Z8659 Personal history of other mental and behavioral disorders: Secondary | ICD-10-CM

## 2021-11-26 DIAGNOSIS — M51369 Other intervertebral disc degeneration, lumbar region without mention of lumbar back pain or lower extremity pain: Secondary | ICD-10-CM

## 2021-11-26 DIAGNOSIS — Z13820 Encounter for screening for osteoporosis: Secondary | ICD-10-CM

## 2021-11-26 DIAGNOSIS — Z8669 Personal history of other diseases of the nervous system and sense organs: Secondary | ICD-10-CM

## 2021-12-11 ENCOUNTER — Other Ambulatory Visit: Payer: Self-pay | Admitting: Internal Medicine

## 2021-12-11 NOTE — Telephone Encounter (Signed)
Please refill as per office routine med refill policy (all routine meds to be refilled for 3 mo or monthly (per pt preference) up to one year from last visit, then month to month grace period for 3 mo, then further med refills will have to be denied) ? ?

## 2021-12-29 ENCOUNTER — Other Ambulatory Visit: Payer: Self-pay | Admitting: Internal Medicine

## 2021-12-29 NOTE — Telephone Encounter (Signed)
Please refill as per office routine med refill policy (all routine meds to be refilled for 3 mo or monthly (per pt preference) up to one year from last visit, then month to month grace period for 3 mo, then further med refills will have to be denied) ? ?

## 2022-05-03 ENCOUNTER — Telehealth: Payer: Self-pay | Admitting: Internal Medicine

## 2022-05-03 NOTE — Telephone Encounter (Signed)
Patient scheduled to see Willette Cluster, NP on 06/29/22 at 11:30 am. My Chart message sent to patient to notify her of appt.

## 2022-05-03 NOTE — Telephone Encounter (Signed)
Patient seen in recovery w/ husband today - needs office appt for dysphagia

## 2022-05-19 NOTE — Progress Notes (Signed)
Office Visit Note  Patient: Gabrielle Benson             Date of Birth: 26-Apr-1955           MRN: [id number]             PCP: Corwin Levins, MD Referring: Corwin Levins, MD Visit Date: 06/01/2022 Occupation: @GUAROCC @  Subjective:  Pain in multiple joints  History of Present Illness: Gabrielle Benson is a 67 y.o. female with history of osteoarthritis, degenerative disc disease and fibromyalgia syndrome.  She has been taking methocarbamol 500 mg as needed for muscle spasms and Lyrica 150 mg twice a day for pain.  As she continues to have pain and discomfort in the muscles and joints.  She states she joined [employer] but has not gone for the sessions yet.  She continues to have discomfort in her bilateral trochanteric bursa.  She states she has been very busy at her work and did not get much time for stretching.  She continues to have some discomfort in her knee joints.  Right knee joint is replaced.  She has pain and stiffness in the neck and lower back due to underlying disc disease.  She has intermittent radiculopathy to her left lower extremity.  She gets intermittent pain and discomfort in her wrist and hands.  She states she has been seeing Dr. Melvyn Novas at [facility] who is given her injections in her left wrist.    Activities of Daily Living:  Patient reports morning stiffness for a few hours.   Patient Reports nocturnal pain.  Difficulty dressing/grooming: Denies Difficulty climbing stairs: Denies Difficulty getting out of chair: Denies Difficulty using hands for taps, buttons, cutlery, and/or writing: Reports  Review of Systems  Constitutional:  Positive for fatigue.  HENT:  Negative for mouth sores and mouth dryness.   Eyes:  Negative for dryness.  Respiratory:  Negative for shortness of breath.   Cardiovascular:  Negative for palpitations.  Gastrointestinal:  Positive for constipation. Negative for blood in stool and diarrhea.  Endocrine: Negative for increased urination.   Genitourinary:  Negative for involuntary urination.  Musculoskeletal:  Positive for joint pain, joint pain, myalgias, morning stiffness, muscle tenderness and myalgias. Negative for gait problem, joint swelling and muscle weakness.  Skin:  Negative for color change, rash, hair loss and sensitivity to sunlight.  Allergic/Immunologic: Positive for susceptible to infections.  Neurological:  Negative for dizziness and headaches.  Hematological:  Negative for swollen glands.  Psychiatric/Behavioral:  Positive for sleep disturbance. Negative for depressed mood. The patient is nervous/anxious.     PMFS History:  Patient Active Problem List   Diagnosis Date Noted   Elevated PTHrP level 11/02/2021   Depression 11/02/2021   Hypercalcemia 10/23/2019   Acute upper respiratory infection 09/30/2017   Hyperglycemia 10/14/2016   DJD (degenerative joint disease), cervical 10/05/2016   DDD (degenerative disc disease), lumbar 10/05/2016   Primary osteoarthritis of left knee 10/05/2016   Acute left-sided low back pain 03/25/2016   Pain, joint, multiple sites 03/25/2016   History of insomnia 03/25/2016   Greater trochanteric bursitis of both hips 03/25/2016   Wheezing 02/18/2016   Chronic pain syndrome 10/11/2015   Memory dysfunction 04/01/2015   Status post right partial knee replacement 10/07/2014   Cough 11/02/2013   Herpes labialis 11/02/2013   GERD (gastroesophageal reflux disease) 11/02/2012   Anxiety 07/01/2011   Vitamin D deficiency 03/18/2011   Encounter for well adult exam with abnormal findings 03/13/2011   PERIPHERAL NEUROPATHY 10/03/2007  Hyperlipidemia 01/05/2007   ANEMIA-IRON DEFICIENCY 01/05/2007   INSOMNIA-SLEEP DISORDER-UNSPEC 01/05/2007   Allergic rhinitis 01/05/2007   Irritable bowel syndrome with constipation 01/05/2007   Fibromyalgia syndrome 01/05/2007    Past Medical History:  Diagnosis Date   Allergic rhinitis    ANA positive    Anemia    Iron deficiency    Anxiety    Cervicalgia    Chronic   Complication of anesthesia    hard to go to sleep with Anesthesia-had to have Benadryl with Colonoscopy   DDD (degenerative disc disease)    Cervical   DJD (degenerative joint disease)    Fibromyalgia    GERD (gastroesophageal reflux disease)    Hyperlipidemia    Multinodular thyroid    Neuropathy, peripheral    Pneumonia    Vitamin D deficiency 03/18/2011    Family History  Problem Relation Age of Onset   Heart disease Father    Sickle cell trait Other    Lung cancer Other    Colon cancer Other        Aunt   Diabetes Mother    Healthy Daughter    Healthy Son    Stomach cancer Neg Hx    Rectal cancer Neg Hx    Esophageal cancer Neg Hx    Past Surgical History:  Procedure Laterality Date   ABDOMINAL HYSTERECTOMY     ABDOMINAL HYSTERECTOMY     partial   BUNIONECTOMY  2011   left foot   DILATION AND CURETTAGE OF UTERUS     KNEE ARTHROSCOPY  2004   right   MYOMECTOMY  2002   prior to hysterectomy   PARTIAL KNEE ARTHROPLASTY Right 10/07/2014   Procedure: RIGHT UNI KNEE ARTHROPLASTY LATERALLY ;  Surgeon: Durene Romans, MD;  Location: [facility];  Service: Orthopedics;  Laterality: Right;   TONSILLECTOMY     age 41   TOTAL SHOULDER ARTHROPLASTY Left 04/28/2017   Social History   Social History Narrative   Married 2 kids   Receptionist at [employer] History  Administered Date(s) Administered   Influenza Whole 11/22/2006   Influenza, High Dose Seasonal PF 04/15/2017   Influenza,inj,Quad PF,6+ Mos 10/29/2020   Influenza-Unspecified 11/22/2016, 10/18/2020   PFIZER(Purple Top)SARS-COV-2 Vaccination 04/28/2019, 05/21/2019, 06/29/2019, 02/23/2020   Td 01/19/1992, 09/06/2014   Tdap 07/01/2011   Zoster Recombinat (Shingrix) 10/23/2019, 01/04/2020     Objective: Vital Signs: BP 127/82 (BP Location: Left Arm, Patient Position: Sitting, Cuff Size: Large)   Pulse 66   Resp 14   Ht 5' 3.5" (1.613 m)   Wt 188 lb 9.6 oz (85.5 kg)    BMI 32.88 kg/m    Physical Exam Vitals and nursing note reviewed.  Constitutional:      Appearance: She is well-developed.  HENT:     Head: Normocephalic and atraumatic.  Eyes:     Conjunctiva/sclera: Conjunctivae normal.  Cardiovascular:     Rate and Rhythm: Normal rate and regular rhythm.     Heart sounds: Normal heart sounds.  Pulmonary:     Effort: Pulmonary effort is normal.     Breath sounds: Normal breath sounds.  Abdominal:     General: Bowel sounds are normal.     Palpations: Abdomen is soft.  Musculoskeletal:     Cervical back: Normal range of motion.  Lymphadenopathy:     Cervical: No cervical adenopathy.  Skin:    General: Skin is warm and dry.     Capillary Refill: Capillary refill takes less than 2  seconds.  Neurological:     Mental Status: She is alert and oriented to person, place, and time.  Psychiatric:        Behavior: Behavior normal.      Musculoskeletal Exam: Cervical spine was in good range of motion.  Shoulder joints, elbow joints, wrist joints, MCPs PIPs and DIPs were in good range of motion with no synovitis.  Hip joints and knee joints in good range of motion.  She had tenderness over bilateral trochanteric bursa.  There was no tenderness over ankles or MTPs.  CDAI Exam: CDAI Score: -- Patient Global: --; Provider Global: -- Swollen: --; Tender: -- Joint Exam 06/01/2022   No joint exam has been documented for this visit   There is currently no information documented on the homunculus. Go to the Rheumatology activity and complete the homunculus joint exam.  Investigation: No additional findings.  Imaging: No results found.  Recent Labs: Lab Results  Component Value Date   WBC 5.5 10/29/2021   HGB 11.2 (L) 10/29/2021   PLT 325.0 10/29/2021   NA 136 10/29/2021   K 4.2 10/29/2021   CL 102 10/29/2021   CO2 27 10/29/2021   GLUCOSE 116 (H) 10/29/2021   BUN 11 10/29/2021   CREATININE 0.64 10/29/2021   BILITOT 0.3 10/29/2021    ALKPHOS 99 10/29/2021   AST 27 10/29/2021   ALT 19 10/29/2021   PROT 7.7 10/29/2021   ALBUMIN 4.3 10/29/2021   CALCIUM 10.6 (H) 10/29/2021   CALCIUM 10.7 (H) 10/29/2021   GFRAA >60 08/28/2016    Speciality Comments: No specialty comments available.  Procedures:  Large Joint Inj: bilateral greater trochanter on 06/01/2022 10:26 AM Indications: pain Details: 27 G 1.5 in needle, lateral approach  Arthrogram: No  Medications (Right): 1.5 mL lidocaine 1 %; 40 mg triamcinolone acetonide 40 MG/ML Aspirate (Right): 0 mL Medications (Left): 1.5 mL lidocaine 1 %; 40 mg triamcinolone acetonide 40 MG/ML Aspirate (Left): 0 mL Outcome: tolerated well, no immediate complications Procedure, treatment alternatives, risks and benefits explained, specific risks discussed. Consent was given by the patient. Immediately prior to procedure a time out was called to verify the correct patient, procedure, equipment, support staff and site/side marked as required. Patient was prepped and draped in the usual sterile fashion.     Allergies: Ampicillin, Duloxetine, and Penicillins   Assessment / Plan:     Visit Diagnoses: Fibromyalgia syndrome -she continues to have generalized pain hyperalgesia.  She had multiple tender points.  She had intermittent flares with the weather change.  She is on Lyrica 150 mg 1 capsule by mouth twice daily and methocarbamol 500 mg by mouth as needed during flares.  She states that her work has been very busy and she did not get much time for exercise.  Other fatigue-related to fibromyalgia and insomnia.  Primary insomnia-good sleep hygiene was discussed.  Pain in both hands-patient complains of intermittent discomfort in her hands.  She states she has had cortisone injections by Dr. Melvyn Novas in the past.  No warmth swelling or synovitis was noted.  Greater trochanteric bursitis of both hips-she has been having increased pain and discomfort in her bilateral trochanteric region.   A handout on IT band stretches was given.  Patient requested bilateral trochanteric bursa injections.  After informed consent was obtained and side effects were discussed, bilateral trochanteric bursa were injected with lidocaine and Kenalog as described above.  Primary osteoarthritis of left knee-she continues to have off-and-on discomfort in the left knee joint.  She is followed at [facility].  Status post right partial knee replacement-she has been doing better.  DDD (degenerative disc disease), cervical-she had good range motion today.  She has intermittent discomfort in her cervical spine.  She had some trapezius spasm.  She takes muscle relaxers as needed.  DDD (degenerative disc disease), lumbar-she has intermittent lower back pain.  She had no point tenderness.  She denies any radiculopathy today.  History of scoliosis  History of neuropathy - She has longstanding history of neuropathy and had been followed by Dr. Sandria Manly in the past.  History of chronic pain-managed with lower plan methocarbamol.  History of anxiety  History of vitamin D deficiency-she takes vitamin D supplement on a regular basis.  Osteoporosis screening - Patient updated DEXA at [facility] in October 2022 per patient by Dr. Cherly Hensen.  Orders: Orders Placed This Encounter  Procedures   Large Joint Inj   No orders of the defined types were placed in this encounter.    Follow-Up Instructions: Return in about 6 months (around 12/02/2022) for Osteoarthritis.   Pollyann Savoy, MD  Note - This record has been created using Animal nutritionist.  Chart creation errors have been sought, but may not always  have been located. Such creation errors do not reflect on  the standard of medical care.

## 2022-05-28 ENCOUNTER — Ambulatory Visit: Payer: 59 | Admitting: Rheumatology

## 2022-06-01 ENCOUNTER — Encounter: Payer: Self-pay | Admitting: Rheumatology

## 2022-06-01 ENCOUNTER — Ambulatory Visit: Payer: 59 | Attending: Rheumatology | Admitting: Rheumatology

## 2022-06-01 VITALS — BP 127/82 | HR 66 | Resp 14 | Ht 63.5 in | Wt 188.6 lb

## 2022-06-01 DIAGNOSIS — M7061 Trochanteric bursitis, right hip: Secondary | ICD-10-CM

## 2022-06-01 DIAGNOSIS — M5030 Other cervical disc degeneration, unspecified cervical region: Secondary | ICD-10-CM

## 2022-06-01 DIAGNOSIS — M7062 Trochanteric bursitis, left hip: Secondary | ICD-10-CM

## 2022-06-01 DIAGNOSIS — M797 Fibromyalgia: Secondary | ICD-10-CM

## 2022-06-01 DIAGNOSIS — Z13820 Encounter for screening for osteoporosis: Secondary | ICD-10-CM

## 2022-06-01 DIAGNOSIS — R5383 Other fatigue: Secondary | ICD-10-CM | POA: Diagnosis not present

## 2022-06-01 DIAGNOSIS — Z96651 Presence of right artificial knee joint: Secondary | ICD-10-CM

## 2022-06-01 DIAGNOSIS — M5136 Other intervertebral disc degeneration, lumbar region: Secondary | ICD-10-CM

## 2022-06-01 DIAGNOSIS — Z8739 Personal history of other diseases of the musculoskeletal system and connective tissue: Secondary | ICD-10-CM

## 2022-06-01 DIAGNOSIS — Z8669 Personal history of other diseases of the nervous system and sense organs: Secondary | ICD-10-CM

## 2022-06-01 DIAGNOSIS — M79641 Pain in right hand: Secondary | ICD-10-CM

## 2022-06-01 DIAGNOSIS — F5101 Primary insomnia: Secondary | ICD-10-CM

## 2022-06-01 DIAGNOSIS — Z87898 Personal history of other specified conditions: Secondary | ICD-10-CM

## 2022-06-01 DIAGNOSIS — M79642 Pain in left hand: Secondary | ICD-10-CM

## 2022-06-01 DIAGNOSIS — Z8659 Personal history of other mental and behavioral disorders: Secondary | ICD-10-CM

## 2022-06-01 DIAGNOSIS — M1712 Unilateral primary osteoarthritis, left knee: Secondary | ICD-10-CM

## 2022-06-01 DIAGNOSIS — Z8639 Personal history of other endocrine, nutritional and metabolic disease: Secondary | ICD-10-CM

## 2022-06-01 MED ORDER — LIDOCAINE HCL 1 % IJ SOLN
1.5000 mL | INTRAMUSCULAR | Status: AC | PRN
Start: 2022-06-01 — End: 2022-06-01
  Administered 2022-06-01: 1.5 mL

## 2022-06-01 MED ORDER — TRIAMCINOLONE ACETONIDE 40 MG/ML IJ SUSP
40.0000 mg | INTRAMUSCULAR | Status: AC | PRN
Start: 2022-06-01 — End: 2022-06-01
  Administered 2022-06-01: 40 mg via INTRA_ARTICULAR

## 2022-06-01 NOTE — Patient Instructions (Addendum)
Hand Exercises Hand exercises can be helpful for almost anyone. These exercises can strengthen the hands, improve flexibility and movement, and increase blood flow to the hands. These results can make work and daily tasks easier. Hand exercises can be especially helpful for people who have joint pain from arthritis or have nerve damage from overuse (carpal tunnel syndrome). These exercises can also help people who have injured a hand. Exercises Most of these hand exercises are gentle stretching and motion exercises. It is usually safe to do them often throughout the day. Warming up your hands before exercise may help to reduce stiffness. You can do this with gentle massage or by placing your hands in warm water for 10-15 minutes. It is normal to feel some stretching, pulling, tightness, or mild discomfort as you begin new exercises. This will gradually improve. Stop an exercise right away if you feel sudden, severe pain or your pain gets worse. Ask your health care provider which exercises are best for you. Knuckle bend or "claw" fist  Stand or sit with your arm, hand, and all five fingers pointed straight up. Make sure to keep your wrist straight during the exercise. Gently bend your fingers down toward your palm until the tips of your fingers are touching the top of your palm. Keep your big knuckle straight and just bend the small knuckles in your fingers. Hold this position for __________ seconds. Straighten (extend) your fingers back to the starting position. Repeat this exercise 5-10 times with each hand. Full finger fist  Stand or sit with your arm, hand, and all five fingers pointed straight up. Make sure to keep your wrist straight during the exercise. Gently bend your fingers into your palm until the tips of your fingers are touching the middle of your palm. Hold this position for __________ seconds. Extend your fingers back to the starting position, stretching every joint fully. Repeat  this exercise 5-10 times with each hand. Straight fist Stand or sit with your arm, hand, and all five fingers pointed straight up. Make sure to keep your wrist straight during the exercise. Gently bend your fingers at the big knuckle, where your fingers meet your hand, and the middle knuckle. Keep the knuckle at the tips of your fingers straight and try to touch the bottom of your palm. Hold this position for __________ seconds. Extend your fingers back to the starting position, stretching every joint fully. Repeat this exercise 5-10 times with each hand. Tabletop  Stand or sit with your arm, hand, and all five fingers pointed straight up. Make sure to keep your wrist straight during the exercise. Gently bend your fingers at the big knuckle, where your fingers meet your hand, as far down as you can while keeping the small knuckles in your fingers straight. Think of forming a tabletop with your fingers. Hold this position for __________ seconds. Extend your fingers back to the starting position, stretching every joint fully. Repeat this exercise 5-10 times with each hand. Finger spread  Place your hand flat on a table with your palm facing down. Make sure your wrist stays straight as you do this exercise. Spread your fingers and thumb apart from each other as far as you can until you feel a gentle stretch. Hold this position for __________ seconds. Bring your fingers and thumb tight together again. Hold this position for __________ seconds. Repeat this exercise 5-10 times with each hand. Making circles  Stand or sit with your arm, hand, and all five fingers pointed   straight up. Make sure to keep your wrist straight during the exercise. Make a circle by touching the tip of your thumb to the tip of your index finger. Hold for __________ seconds. Then open your hand wide. Repeat this motion with your thumb and each finger on your hand. Repeat this exercise 5-10 times with each hand. Thumb  motion  Sit with your forearm resting on a table and your wrist straight. Your thumb should be facing up toward the ceiling. Keep your fingers relaxed as you move your thumb. Lift your thumb up as high as you can toward the ceiling. Hold for __________ seconds. Bend your thumb across your palm as far as you can, reaching the tip of your thumb for the small finger (pinkie) side of your palm. Hold for __________ seconds. Repeat this exercise 5-10 times with each hand. Grip strengthening  Hold a stress ball or other soft ball in the middle of your hand. Slowly increase the pressure, squeezing the ball as much as you can without causing pain. Think of bringing the tips of your fingers into the middle of your palm. All of your finger joints should bend when doing this exercise. Hold your squeeze for __________ seconds, then relax. Repeat this exercise 5-10 times with each hand. Contact a health care provider if: Your hand pain or discomfort gets much worse when you do an exercise. Your hand pain or discomfort does not improve within 2 hours after you exercise. If you have any of these problems, stop doing these exercises right away. Do not do them again unless your health care provider says that you can. Get help right away if: You develop sudden, severe hand pain or swelling. If this happens, stop doing these exercises right away. Do not do them again unless your health care provider says that you can. This information is not intended to replace advice given to you by your health care provider. Make sure you discuss any questions you have with your health care provider. Document Revised: 04/17/2020 Document Reviewed: 04/24/2020 Elsevier Patient Education  2023 [employer] Iliotibial Band Syndrome Rehab Ask your health care provider which exercises are safe for you. Do exercises exactly as told by your health care provider and adjust them as directed. It is normal to feel mild stretching,  pulling, tightness, or discomfort as you do these exercises. Stop right away if you feel sudden pain or your pain gets significantly worse. Do not begin these exercises until told by your health care provider. Stretching and range-of-motion exercises These exercises warm up your muscles and joints and improve the movement and flexibility of your hip and pelvis. Quadriceps stretch, prone  Lie on your abdomen (prone position) on a firm surface, such as a bed or padded floor. Bend your left / right knee and reach back to hold your ankle or pant leg. If you cannot reach your ankle or pant leg, loop a belt around your foot and grab the belt instead. Gently pull your heel toward your buttocks. Your knee should not slide out to the side. You should feel a stretch in the front of your thigh and knee (quadriceps). Hold this position for __________ seconds. Repeat __________ times. Complete this exercise __________ times a day. Iliotibial band stretch An iliotibial band is a strong band of muscle tissue that runs from the outer side of your hip to the outer side of your thigh and knee. Lie on your side with your left / right leg in the top   position. Bend both of your knees and grab your left / right ankle. Stretch out your bottom arm to help you balance. Slowly bring your top knee back so your thigh goes behind your trunk. Slowly lower your top leg toward the floor until you feel a gentle stretch on the outside of your left / right hip and thigh. If you do not feel a stretch and your knee will not fall farther, place the heel of your other foot on top of your knee and pull your knee down toward the floor with your foot. Hold this position for __________ seconds. Repeat __________ times. Complete this exercise __________ times a day. Strengthening exercises These exercises build strength and endurance in your hip and pelvis. Endurance is the ability to use your muscles for a long time, even after they get  tired. Straight leg raises, side-lying This exercise strengthens the muscles that rotate the leg at the hip and move it away from your body (hip abductors). Lie on your side with your left / right leg in the top position. Lie so your head, shoulder, hip, and knee line up. You may bend your bottom knee to help you balance. Roll your hips slightly forward so your hips are stacked directly over each other and your left / right knee is facing forward. Tense the muscles in your outer thigh and lift your top leg 4-6 inches (10-15 cm). Hold this position for __________ seconds. Slowly lower your leg to return to the starting position. Let your muscles relax completely before doing another repetition. Repeat __________ times. Complete this exercise __________ times a day. Leg raises, prone This exercise strengthens the muscles that move the hips backward (hip extensors). Lie on your abdomen (prone position) on your bed or a firm surface. You can put a pillow under your hips if that is more comfortable for your lower back. Bend your left / right knee so your foot is straight up in the air. Squeeze your buttocks muscles and lift your left / right thigh off the bed. Do not let your back arch. Tense your thigh muscle as hard as you can without increasing any knee pain. Hold this position for __________ seconds. Slowly lower your leg to return to the starting position and allow it to relax completely. Repeat __________ times. Complete this exercise __________ times a day. Hip hike Stand sideways on a bottom step. Stand on your left / right leg with your other foot unsupported next to the step. You can hold on to a railing or wall for balance if needed. Keep your knees straight and your torso square. Then lift your left / right hip up toward the ceiling. Slowly let your left / right hip lower toward the floor, past the starting position. Your foot should get closer to the floor. Do not lean or bend your  knees. Repeat __________ times. Complete this exercise __________ times a day. This information is not intended to replace advice given to you by your health care provider. Make sure you discuss any questions you have with your health care provider. Document Revised: 03/14/2019 Document Reviewed: 03/14/2019 Elsevier Patient Education  2023 Elsevier Inc.  

## 2022-06-29 ENCOUNTER — Ambulatory Visit: Payer: 59 | Admitting: Nurse Practitioner

## 2022-07-08 ENCOUNTER — Ambulatory Visit: Payer: 59 | Admitting: Nurse Practitioner

## 2022-07-16 ENCOUNTER — Encounter: Payer: Self-pay | Admitting: Internal Medicine

## 2022-07-16 ENCOUNTER — Ambulatory Visit: Payer: 59 | Admitting: Internal Medicine

## 2022-07-16 VITALS — BP 118/80 | HR 85 | Temp 98.3°F | Ht 63.5 in | Wt 185.0 lb

## 2022-07-16 DIAGNOSIS — M79604 Pain in right leg: Secondary | ICD-10-CM | POA: Diagnosis not present

## 2022-07-16 DIAGNOSIS — E7800 Pure hypercholesterolemia, unspecified: Secondary | ICD-10-CM

## 2022-07-16 DIAGNOSIS — M79605 Pain in left leg: Secondary | ICD-10-CM

## 2022-07-16 DIAGNOSIS — E559 Vitamin D deficiency, unspecified: Secondary | ICD-10-CM | POA: Diagnosis not present

## 2022-07-16 DIAGNOSIS — F419 Anxiety disorder, unspecified: Secondary | ICD-10-CM | POA: Diagnosis not present

## 2022-07-16 NOTE — Patient Instructions (Signed)
Please take OTC Vitamin D3 at 2000 units per day, indefinitely, as well as the B12 1000 mcg per day, and Magnesium is also usually helpful  Ok to restart the robaxin as you have, and the lyrical for pain as well  Please continue all other medications as before, including the lipitor  Please have the pharmacy call with any other refills you may need.  Please continue your efforts at being more active, low cholesterol diet, and weight control.  Please keep your appointments with your specialists as you may have planned  You will be contacted regarding the referral for: leg circulation testing  Please make an Appointment to return Oct 2024, as you have planned

## 2022-07-16 NOTE — Progress Notes (Signed)
Patient ID: Gabrielle Benson, female   DOB: May 31, 1955, 67 y.o.   MRN: [id number]          Chief Complaint::  Leg Pain (Cramping and tingling in b/l lower legs)       HPI:  Gabrielle Benson is a 67 y.o. female here with c/o bilateral pain and cramping worse at night but also sometimes during day.  Has not taken her robaxin or lyrica lately but has some at home.  Pt denies chest pain, increased sob or doe, wheezing, orthopnea, PND, increased LE swelling, palpitations, dizziness or syncope.   Pt denies polydipsia, polyuria, or new focal neuro s/s.    Pt denies fever, wt loss, night sweats, loss of appetite, or other constitutional symptoms  Denies worsening depressive symptoms, suicidal ideation, or panic;   Wt Readings from Last 3 Encounters:  07/16/22 185 lb (83.9 kg)  06/01/22 188 lb 9.6 oz (85.5 kg)  11/26/21 189 lb (85.7 kg)   BP Readings from Last 3 Encounters:  07/16/22 118/80  06/01/22 127/82  11/26/21 (!) 141/85   Immunization History  Administered Date(s) Administered   Influenza Whole 11/22/2006   Influenza, High Dose Seasonal PF 04/15/2017   Influenza,inj,Quad PF,6+ Mos 10/29/2020   Influenza-Unspecified 11/22/2016, 10/18/2020   PFIZER(Purple Top)SARS-COV-2 Vaccination 04/28/2019, 05/21/2019, 06/29/2019, 02/23/2020   Td 01/19/1992, 09/06/2014   Tdap 07/01/2011   Zoster Recombinant(Shingrix) 10/23/2019, 01/04/2020   Health Maintenance Due  Topic Date Due   COVID-19 Vaccine (5 - 2023-24 season) 09/18/2021   DEXA SCAN  Never done   Pneumonia Vaccine 15+ Years old (1 of 1 - PCV) 06/27/2022      Past Medical History:  Diagnosis Date   Allergic rhinitis    ANA positive    Anemia    Iron deficiency   Anxiety    Cervicalgia    Chronic   Complication of anesthesia    hard to go to sleep with Anesthesia-had to have Benadryl with Colonoscopy   DDD (degenerative disc disease)    Cervical   DJD (degenerative joint disease)    Fibromyalgia    GERD  (gastroesophageal reflux disease)    Hyperlipidemia    Multinodular thyroid    Neuropathy, peripheral    Pneumonia    Vitamin D deficiency 03/18/2011   Past Surgical History:  Procedure Laterality Date   ABDOMINAL HYSTERECTOMY     ABDOMINAL HYSTERECTOMY     partial   BUNIONECTOMY  2011   left foot   DILATION AND CURETTAGE OF UTERUS     KNEE ARTHROSCOPY  2004   right   MYOMECTOMY  2002   prior to hysterectomy   PARTIAL KNEE ARTHROPLASTY Right 10/07/2014   Procedure: RIGHT UNI KNEE ARTHROPLASTY LATERALLY ;  Surgeon: Durene Romans, MD;  Location: [facility];  Service: Orthopedics;  Laterality: Right;   TONSILLECTOMY     age 56   TOTAL SHOULDER ARTHROPLASTY Left 04/28/2017    reports that she has never smoked. She has been exposed to tobacco smoke. She has never used smokeless tobacco. She reports current alcohol use of about 3.0 - 4.0 standard drinks of alcohol per week. She reports that she does not use drugs. family history includes Colon cancer in an other family member; Diabetes in her mother; Healthy in her daughter and son; Heart disease in her father; Lung cancer in an other family member; Sickle cell trait in an other family member. Allergies  Allergen Reactions   Ampicillin    Duloxetine     REACTION: dizzy,  nervous   Penicillins     Has patient had a PCN reaction causing immediate rash, facial/tongue/throat swelling, SOB or lightheadedness with hypotension: No Has patient had a PCN reaction causing severe rash involving mucus membranes or skin necrosis: No Has patient had a PCN reaction that required hospitalization: No Has patient had a PCN reaction occurring within the last 10 years: No If all of the above answers are "NO", then may proceed with Cephalosporin use.     Current Outpatient Medications on File Prior to Visit  Medication Sig Dispense Refill   atorvastatin (LIPITOR) 20 MG tablet TAKE 1 TABLET BY MOUTH EVERY DAY 90 tablet 3   azelastine (ASTELIN) 0.1 % nasal  spray Place 2 sprays into both nostrils 2 (two) times daily. Use in each nostril as directed 30 mL 0   azelastine (OPTIVAR) 0.05 % ophthalmic solution 1 drop 2 (two) times daily.     diclofenac sodium (VOLTAREN) 1 % GEL Apply 2-4 grams to affected joint up to 4 times a day as needed 4 Tube 2   levocetirizine (XYZAL) 5 MG tablet Take 5 mg by mouth every evening.     LINZESS 290 MCG CAPS capsule TAKE 1 CAPSULE BY MOUTH EVERY DAY BEFORE BREAKFAST 30 capsule 0   Menthol, Topical Analgesic, (BIOFREEZE EX) Apply 1 application topically daily as needed (joint pain).      methocarbamol (ROBAXIN) 500 MG tablet Take 1 tablet (500 mg total) by mouth daily as needed. 90 tablet 0   naproxen sodium (ALEVE) 220 MG tablet Take 220 mg by mouth as needed.     pantoprazole (PROTONIX) 40 MG tablet TAKE 1 TABLET BY MOUTH EVERY DAY 90 tablet 3   pregabalin (LYRICA) 150 MG capsule Take 1 capsule (150 mg total) by mouth 2 (two) times daily. 180 capsule 0   No current facility-administered medications on file prior to visit.        ROS:  All others reviewed and negative.  Objective        PE:  BP 118/80 (BP Location: Left Arm, Patient Position: Sitting, Cuff Size: Large)   Pulse 85   Temp 98.3 F (36.8 C) (Oral)   Ht 5' 3.5" (1.613 m)   Wt 185 lb (83.9 kg)   SpO2 98%   BMI 32.26 kg/m                 Constitutional: Pt appears in NAD               HENT: Head: NCAT.                Right Ear: External ear normal.                 Left Ear: External ear normal.                Eyes: . Pupils are equal, round, and reactive to light. Conjunctivae and EOM are normal               Nose: without d/c or deformity               Neck: Neck supple. Gross normal ROM               Cardiovascular: Normal rate and regular rhythm.                 Pulmonary/Chest: Effort normal and breath sounds without rales or wheezing.  Abd:  Soft, NT, ND, + BS, no organomegaly               Neurological: Pt is alert. At  baseline orientation, motor grossly intact               Skin: Skin is warm. No rashes, no other new lesions, LE edema - none               Psychiatric: Pt behavior is normal without agitation , mild nervous  Micro: none  Cardiac tracings I have personally interpreted today:  none  Pertinent Radiological findings (summarize): none   Lab Results  Component Value Date   WBC 5.5 10/29/2021   HGB 11.2 (L) 10/29/2021   HCT 34.8 (L) 10/29/2021   PLT 325.0 10/29/2021   GLUCOSE 116 (H) 10/29/2021   CHOL 162 10/29/2021   TRIG 112.0 10/29/2021   HDL 69.60 10/29/2021   LDLDIRECT 122.7 07/01/2011   LDLCALC 70 10/29/2021   ALT 19 10/29/2021   AST 27 10/29/2021   NA 136 10/29/2021   K 4.2 10/29/2021   CL 102 10/29/2021   CREATININE 0.64 10/29/2021   BUN 11 10/29/2021   CO2 27 10/29/2021   TSH 0.95 10/29/2021   INR 0.94 10/01/2014   HGBA1C 6.2 10/29/2021   Assessment/Plan:  Gabrielle Benson is a 67 y.o. Black or African American [2] female with  has a past medical history of Allergic rhinitis, ANA positive, Anemia, Anxiety, Cervicalgia, Complication of anesthesia, DDD (degenerative disc disease), DJD (degenerative joint disease), Fibromyalgia, GERD (gastroesophageal reflux disease), Hyperlipidemia, Multinodular thyroid, Neuropathy, peripheral, Pneumonia, and Vitamin D deficiency (03/18/2011).  Bilateral leg pain Etiology unclear, pt to resume home robaxin and lyrica, but also b12 and mag supplements, and LE arterial dopplers  Vitamin D deficiency Last vitamin D Lab Results  Component Value Date   VD25OH 20.41 (L) 10/29/2021   Low, to start oral replacement   Anxiety Overall stable, delcines need for change in tx or referral for counseling  Hyperlipidemia Lab Results  Component Value Date   LDLCALC 70 10/29/2021   Uncontrolled, goal ldl < 70,, pt to continue current statin lipitor 20 mg every day, and lower chol diet, and f/u lab next visit, declines other change for  now  Followup: Return if symptoms worsen or fail to improve.  Oliver Barre, MD 07/18/2022 8:34 PM [facility] [place] [facility]

## 2022-07-18 ENCOUNTER — Encounter: Payer: Self-pay | Admitting: Internal Medicine

## 2022-07-18 DIAGNOSIS — M79604 Pain in right leg: Secondary | ICD-10-CM | POA: Insufficient documentation

## 2022-07-18 NOTE — Assessment & Plan Note (Signed)
Etiology unclear, pt to resume home robaxin and lyrica, but also b12 and mag supplements, and LE arterial dopplers

## 2022-07-18 NOTE — Assessment & Plan Note (Signed)
Last vitamin D Lab Results  Component Value Date   VD25OH 20.41 (L) 10/29/2021   Low, to start  oral replacement  

## 2022-07-18 NOTE — Assessment & Plan Note (Signed)
Lab Results  Component Value Date   LDLCALC 70 10/29/2021   Uncontrolled, goal ldl < 70,, pt to continue current statin lipitor 20 mg every day, and lower chol diet, and f/u lab next visit, declines other change for now

## 2022-07-18 NOTE — Assessment & Plan Note (Signed)
Overall stable, delcines need for change in tx or referral for counseling

## 2022-07-30 ENCOUNTER — Other Ambulatory Visit: Payer: Self-pay | Admitting: Rheumatology

## 2022-07-30 MED ORDER — PREGABALIN 150 MG PO CAPS: 150.0000 mg | ORAL_CAPSULE | Freq: Two times a day (BID) | ORAL | 0 refills | Status: DC

## 2022-07-30 MED ORDER — METHOCARBAMOL 500 MG PO TABS: 500.0000 mg | ORAL_TABLET | Freq: Every day | ORAL | 0 refills | Status: AC | PRN

## 2022-07-30 NOTE — Telephone Encounter (Signed)
Last Fill: 05/05/2020  Next Visit: 12/03/2022  Last Visit: 06/01/2022  Dx:  Fibromyalgia syndrome   Current Dose per office note on 06/01/2022: Lyrica 150 mg 1 capsule by mouth twice daily and methocarbamol 500 mg by mouth as needed during flares.   Okay to refill Methocarbamol and Lyrica?

## 2022-07-30 NOTE — Telephone Encounter (Signed)
Pt called requesting a refill on the methocarbamol and pregabalin at [facility] [address] in [place].

## 2022-08-02 ENCOUNTER — Ambulatory Visit (HOSPITAL_COMMUNITY): Admission: RE | Admit: 2022-08-02 | Payer: 59 | Source: Ambulatory Visit

## 2022-08-06 ENCOUNTER — Ambulatory Visit (HOSPITAL_COMMUNITY)
Admission: RE | Admit: 2022-08-06 | Discharge: 2022-08-06 | Disposition: A | Discharge status: Home / Self Care | Payer: 59 | Source: Ambulatory Visit | Attending: Cardiology | Admitting: Cardiology

## 2022-08-06 DIAGNOSIS — M79604 Pain in right leg: Secondary | ICD-10-CM | POA: Insufficient documentation

## 2022-08-06 DIAGNOSIS — M79605 Pain in left leg: Secondary | ICD-10-CM | POA: Diagnosis not present

## 2022-08-06 LAB — VAS US ABI WITH/WO TBI
Left ABI: 0.96
Right ABI: 0.98

## 2022-10-29 ENCOUNTER — Other Ambulatory Visit: Payer: 59

## 2022-10-29 DIAGNOSIS — R739 Hyperglycemia, unspecified: Secondary | ICD-10-CM | POA: Diagnosis not present

## 2022-10-29 DIAGNOSIS — E7800 Pure hypercholesterolemia, unspecified: Secondary | ICD-10-CM | POA: Diagnosis not present

## 2022-10-29 DIAGNOSIS — E538 Deficiency of other specified B group vitamins: Secondary | ICD-10-CM | POA: Diagnosis not present

## 2022-10-29 DIAGNOSIS — R7989 Other specified abnormal findings of blood chemistry: Secondary | ICD-10-CM

## 2022-10-29 DIAGNOSIS — E559 Vitamin D deficiency, unspecified: Secondary | ICD-10-CM

## 2022-10-29 LAB — URINALYSIS, ROUTINE W REFLEX MICROSCOPIC
Bilirubin Urine: NEGATIVE
Hgb urine dipstick: NEGATIVE
Ketones, ur: NEGATIVE
Nitrite: NEGATIVE
RBC / HPF: NONE SEEN (ref 0–?)
Specific Gravity, Urine: 1.015 (ref 1.000–1.030)
Total Protein, Urine: NEGATIVE
Urine Glucose: NEGATIVE
Urobilinogen, UA: 0.2 (ref 0.0–1.0)
pH: 7 (ref 5.0–8.0)

## 2022-10-29 LAB — BASIC METABOLIC PANEL
BUN: 5 mg/dL — ABNORMAL LOW (ref 6–23)
CO2: 28 meq/L (ref 19–32)
Calcium: 10.7 mg/dL — ABNORMAL HIGH (ref 8.4–10.5)
Chloride: 102 meq/L (ref 96–112)
Creatinine, Ser: 0.6 mg/dL (ref 0.40–1.20)
GFR: 94.25 mL/min (ref >60.00)
Glucose, Bld: 99 mg/dL (ref 70–99)
Potassium: 4.3 meq/L (ref 3.5–5.1)
Sodium: 138 meq/L (ref 135–145)

## 2022-10-29 LAB — HEPATIC FUNCTION PANEL
ALT: 16 U/L (ref 0–35)
AST: 20 U/L (ref 0–37)
Albumin: 4.4 g/dL (ref 3.5–5.2)
Alkaline Phosphatase: 106 U/L (ref 39–117)
Bilirubin, Direct: 0.1 mg/dL (ref 0.0–0.3)
Total Bilirubin: 0.3 mg/dL (ref 0.2–1.2)
Total Protein: 7.3 g/dL (ref 6.0–8.3)

## 2022-10-29 LAB — CBC WITH DIFFERENTIAL/PLATELET
Basophils Absolute: 0.1 10*3/uL (ref 0.0–0.1)
Basophils Relative: 1.1 % (ref 0.0–3.0)
Eosinophils Absolute: 0.1 10*3/uL (ref 0.0–0.7)
Eosinophils Relative: 1.1 % (ref 0.0–5.0)
HCT: 37 % (ref 36.0–46.0)
Hemoglobin: 11.7 g/dL — ABNORMAL LOW (ref 12.0–15.0)
Lymphocytes Relative: 30.1 % (ref 12.0–46.0)
Lymphs Abs: 1.5 10*3/uL (ref 0.7–4.0)
MCHC: 31.7 g/dL (ref 30.0–36.0)
MCV: 84.5 fL (ref 78.0–100.0)
Monocytes Absolute: 0.3 10*3/uL (ref 0.1–1.0)
Monocytes Relative: 6.8 % (ref 3.0–12.0)
Neutro Abs: 3.1 10*3/uL (ref 1.4–7.7)
Neutrophils Relative %: 60.9 % (ref 43.0–77.0)
Platelets: 357 10*3/uL (ref 150.0–400.0)
RBC: 4.38 Mil/uL (ref 3.87–5.11)
RDW: 12.8 % (ref 11.5–15.5)
WBC: 5 10*3/uL (ref 4.0–10.5)

## 2022-10-29 LAB — LIPID PANEL
Cholesterol: 172 mg/dL (ref 0–200)
HDL: 69 mg/dL (ref >39.00)
LDL Cholesterol: 88 mg/dL (ref 0–99)
NonHDL: 103.12
Total CHOL/HDL Ratio: 2
Triglycerides: 77 mg/dL (ref 0.0–149.0)
VLDL: 15.4 mg/dL (ref 0.0–40.0)

## 2022-10-29 LAB — TSH: TSH: 1.31 u[IU]/mL (ref 0.35–5.50)

## 2022-10-29 LAB — VITAMIN B12: Vitamin B-12: 731 pg/mL (ref 211–911)

## 2022-10-29 LAB — HEMOGLOBIN A1C: Hgb A1c MFr Bld: 6 % (ref 4.6–6.5)

## 2022-10-29 LAB — VITAMIN D 25 HYDROXY (VIT D DEFICIENCY, FRACTURES): VITD: 20.53 ng/mL — ABNORMAL LOW (ref 30.00–100.00)

## 2022-10-30 ENCOUNTER — Other Ambulatory Visit: Payer: Self-pay | Admitting: Internal Medicine

## 2022-10-30 DIAGNOSIS — R7989 Other specified abnormal findings of blood chemistry: Secondary | ICD-10-CM

## 2022-10-30 LAB — PTH, INTACT AND CALCIUM
Calcium: 10.5 mg/dL — ABNORMAL HIGH (ref 8.6–10.4)
PTH: 175 pg/mL — ABNORMAL HIGH (ref 16–77)

## 2022-11-04 ENCOUNTER — Encounter: Payer: 59 | Admitting: Internal Medicine

## 2022-11-05 ENCOUNTER — Telehealth: Payer: Self-pay

## 2022-11-05 ENCOUNTER — Ambulatory Visit: Payer: 59 | Admitting: Internal Medicine

## 2022-11-05 ENCOUNTER — Encounter: Payer: Self-pay | Admitting: Internal Medicine

## 2022-11-05 VITALS — BP 124/76 | HR 80 | Temp 98.3°F | Ht 63.5 in | Wt 186.0 lb

## 2022-11-05 DIAGNOSIS — E7800 Pure hypercholesterolemia, unspecified: Secondary | ICD-10-CM

## 2022-11-05 DIAGNOSIS — E2839 Other primary ovarian failure: Secondary | ICD-10-CM | POA: Diagnosis not present

## 2022-11-05 DIAGNOSIS — F419 Anxiety disorder, unspecified: Secondary | ICD-10-CM

## 2022-11-05 DIAGNOSIS — Z23 Encounter for immunization: Secondary | ICD-10-CM | POA: Diagnosis not present

## 2022-11-05 DIAGNOSIS — E559 Vitamin D deficiency, unspecified: Secondary | ICD-10-CM

## 2022-11-05 DIAGNOSIS — H6991 Unspecified Eustachian tube disorder, right ear: Secondary | ICD-10-CM

## 2022-11-05 DIAGNOSIS — E538 Deficiency of other specified B group vitamins: Secondary | ICD-10-CM | POA: Diagnosis not present

## 2022-11-05 DIAGNOSIS — D509 Iron deficiency anemia, unspecified: Secondary | ICD-10-CM

## 2022-11-05 DIAGNOSIS — Z0001 Encounter for general adult medical examination with abnormal findings: Secondary | ICD-10-CM | POA: Diagnosis not present

## 2022-11-05 DIAGNOSIS — R739 Hyperglycemia, unspecified: Secondary | ICD-10-CM

## 2022-11-05 DIAGNOSIS — R7989 Other specified abnormal findings of blood chemistry: Secondary | ICD-10-CM

## 2022-11-05 MED ORDER — HYDROCODONE BIT-HOMATROP MBR 5-1.5 MG/5ML PO SOLN: 5.0000 mL | Freq: Four times a day (QID) | ORAL | 0 refills | Status: AC | PRN

## 2022-11-05 NOTE — Patient Instructions (Signed)
You had the Prevnar 20 pneumonia shot today  Please continue all other medications as before, and refills have been done if requested.  Please have the pharmacy call with any other refills you may need.  Please continue your efforts at being more active, low cholesterol diet, and weight control.  You are otherwise up to date with prevention measures today.  Please keep your appointments with your specialists as you may have planned - endocrinology  You will be contacted regarding the referral for:  Bone Density at [facility]  Please make an Appointment to return for your 1 year visit, or sooner if needed, with Lab testing by Appointment as well, to be done about 3-5 days before at the FIRST FLOOR Lab (so this is for TWO appointments - please see the scheduling desk as you leave)

## 2022-11-05 NOTE — Telephone Encounter (Signed)
Ok done erx 

## 2022-11-05 NOTE — Progress Notes (Unsigned)
Patient ID: Adelene Idler, female   DOB: 1955/05/28, 67 y.o.   MRN: [id number]         Chief Complaint:: wellness exam and Annual Exam (Wants to review lab work , wants some prescribed cough syrup, had some stress that may have causing thumping in right ear )  , elevated PTH, low vit d       HPI:  Saloni Hedgepeth is a 67 y.o. female here for wellness exam; for flu shot next wed at work; due for DXA at [facility], for RSV at pharmacy, for prevnar 20 today, o/w up to date                        Also has more stress with some financial issues.  Has some fullness and pulsing in the right ear at times when more stressed and heart rate elevates.  Still not taking vit d well - off an on only.  Pt denies chest pain, increased sob or doe, wheezing, orthopnea, PND, increased LE swelling, palpitations, dizziness or syncope.   Pt denies polydipsia, polyuria, or new focal neuro s/s.    Pt denies fever, wt loss, night sweats, loss of appetite, or other constitutional symptoms   has chronic cough unclear etiology no change.     Wt Readings from Last 3 Encounters:  11/05/22 186 lb (84.4 kg)  07/16/22 185 lb (83.9 kg)  06/01/22 188 lb 9.6 oz (85.5 kg)   BP Readings from Last 3 Encounters:  11/05/22 124/76  07/16/22 118/80  06/01/22 127/82   Immunization History  Administered Date(s) Administered   Influenza Whole 11/22/2006   Influenza, High Dose Seasonal PF 04/15/2017   Influenza,inj,Quad PF,6+ Mos 10/29/2020   Influenza-Unspecified 11/22/2016, 10/18/2020   PFIZER(Purple Top)SARS-COV-2 Vaccination 04/28/2019, 05/21/2019, 06/29/2019, 02/23/2020   PNEUMOCOCCAL CONJUGATE-20 11/05/2022   Td 01/19/1992, 09/06/2014   Tdap 07/01/2011   Zoster Recombinant(Shingrix) 10/23/2019, 01/04/2020   Health Maintenance Due  Topic Date Due   DEXA SCAN  Never done   COVID-19 Vaccine (5 - 2023-24 season) 09/19/2022      Past Medical History:  Diagnosis Date   Allergic rhinitis    ANA positive    Anemia     Iron deficiency   Anxiety    Cervicalgia    Chronic   Complication of anesthesia    hard to go to sleep with Anesthesia-had to have Benadryl with Colonoscopy   DDD (degenerative disc disease)    Cervical   DJD (degenerative joint disease)    Fibromyalgia    GERD (gastroesophageal reflux disease)    Hyperlipidemia    Multinodular thyroid    Neuropathy, peripheral    Pneumonia    Vitamin D deficiency 03/18/2011   Past Surgical History:  Procedure Laterality Date   ABDOMINAL HYSTERECTOMY     ABDOMINAL HYSTERECTOMY     partial   BUNIONECTOMY  2011   left foot   DILATION AND CURETTAGE OF UTERUS     KNEE ARTHROSCOPY  2004   right   MYOMECTOMY  2002   prior to hysterectomy   PARTIAL KNEE ARTHROPLASTY Right 10/07/2014   Procedure: RIGHT UNI KNEE ARTHROPLASTY LATERALLY ;  Surgeon: Durene Romans, MD;  Location: [facility];  Service: Orthopedics;  Laterality: Right;   TONSILLECTOMY     age 89   TOTAL SHOULDER ARTHROPLASTY Left 04/28/2017    reports that she has never smoked. She has been exposed to tobacco smoke. She has never used smokeless tobacco. She reports current  alcohol use of about 3.0 - 4.0 standard drinks of alcohol per week. She reports that she does not use drugs. family history includes Colon cancer in an other family member; Diabetes in her mother; Healthy in her daughter and son; Heart disease in her father; Lung cancer in an other family member; Sickle cell trait in an other family member. Allergies  Allergen Reactions   Ampicillin    Duloxetine     REACTION: dizzy, nervous   Penicillins     Has patient had a PCN reaction causing immediate rash, facial/tongue/throat swelling, SOB or lightheadedness with hypotension: No Has patient had a PCN reaction causing severe rash involving mucus membranes or skin necrosis: No Has patient had a PCN reaction that required hospitalization: No Has patient had a PCN reaction occurring within the last 10 years: No If all of the above  answers are "NO", then may proceed with Cephalosporin use.     Current Outpatient Medications on File Prior to Visit  Medication Sig Dispense Refill   atorvastatin (LIPITOR) 20 MG tablet TAKE 1 TABLET BY MOUTH EVERY DAY 90 tablet 3   azelastine (ASTELIN) 0.1 % nasal spray Place 2 sprays into both nostrils 2 (two) times daily. Use in each nostril as directed 30 mL 0   azelastine (OPTIVAR) 0.05 % ophthalmic solution 1 drop 2 (two) times daily.     clindamycin (CLEOCIN) 300 MG capsule TAKE 2 CAPSULES BY MOUTH ONE HOUR PRIOR TO DENTAL APPOINTMENT     diclofenac sodium (VOLTAREN) 1 % GEL Apply 2-4 grams to affected joint up to 4 times a day as needed 4 Tube 2   levocetirizine (XYZAL) 5 MG tablet Take 5 mg by mouth every evening.     LINZESS 290 MCG CAPS capsule TAKE 1 CAPSULE BY MOUTH EVERY DAY BEFORE BREAKFAST 30 capsule 0   Menthol, Topical Analgesic, (BIOFREEZE EX) Apply 1 application topically daily as needed (joint pain).      methocarbamol (ROBAXIN) 500 MG tablet Take 1 tablet (500 mg total) by mouth daily as needed. 90 tablet 0   naproxen sodium (ALEVE) 220 MG tablet Take 220 mg by mouth as needed.     pantoprazole (PROTONIX) 40 MG tablet TAKE 1 TABLET BY MOUTH EVERY DAY 90 tablet 3   pregabalin (LYRICA) 150 MG capsule Take 1 capsule (150 mg total) by mouth 2 (two) times daily. 180 capsule 0   No current facility-administered medications on file prior to visit.        ROS:  All others reviewed and negative.  Objective        PE:  BP 124/76 (BP Location: Right Arm, Patient Position: Sitting, Cuff Size: Normal)   Pulse 80   Temp 98.3 F (36.8 C) (Oral)   Ht 5' 3.5" (1.613 m)   Wt 186 lb (84.4 kg)   SpO2 98%   BMI 32.43 kg/m                 Constitutional: Pt appears in NAD               HENT: Head: NCAT.                Right Ear: External ear normal.  Right tm with mild erythema               Left Ear: External ear normal.                Eyes: . Pupils are equal, round, and  reactive  to light. Conjunctivae and EOM are normal               Nose: without d/c or deformity               Neck: Neck supple. Gross normal ROM               Cardiovascular: Normal rate and regular rhythm.                 Pulmonary/Chest: Effort normal and breath sounds without rales or wheezing.                Abd:  Soft, NT, ND, + BS, no organomegaly               Neurological: Pt is alert. At baseline orientation, motor grossly intact               Skin: Skin is warm. No rashes, no other new lesions, LE edema - none               Psychiatric: Pt behavior is normal without agitation , mild nervous  Micro: none  Cardiac tracings I have personally interpreted today:  none  Pertinent Radiological findings (summarize): none   Lab Results  Component Value Date   WBC 5.0 10/29/2022   HGB 11.7 (L) 10/29/2022   HCT 37.0 10/29/2022   PLT 357.0 10/29/2022   GLUCOSE 99 10/29/2022   CHOL 172 10/29/2022   TRIG 77.0 10/29/2022   HDL 69.00 10/29/2022   LDLDIRECT 122.7 07/01/2011   LDLCALC 88 10/29/2022   ALT 16 10/29/2022   AST 20 10/29/2022   NA 138 10/29/2022   K 4.3 10/29/2022   CL 102 10/29/2022   CREATININE 0.60 10/29/2022   BUN 5 (L) 10/29/2022   CO2 28 10/29/2022   TSH 1.31 10/29/2022   INR 0.94 10/01/2014   HGBA1C 6.0 10/29/2022   Assessment/Plan:  Shiane Rehrer is a 67 y.o. Black or African American [2] female with  has a past medical history of Allergic rhinitis, ANA positive, Anemia, Anxiety, Cervicalgia, Complication of anesthesia, DDD (degenerative disc disease), DJD (degenerative joint disease), Fibromyalgia, GERD (gastroesophageal reflux disease), Hyperlipidemia, Multinodular thyroid, Neuropathy, peripheral, Pneumonia, and Vitamin D deficiency (03/18/2011).  Encounter for well adult exam with abnormal findings Age and sex appropriate education and counseling updated with regular exercise and diet Referrals for preventative services - for DXA at [facility]   Immunizations addressed - for flu at work, for [employer] at pharmacy, for prevnar 20 today Smoking counseling  - none needed Evidence for depression or other mood disorder - increased anxiety and stress over baseline with social stressors, declines counseling or change in tx for now Most recent labs reviewed I have personally reviewed and have noted: 1) the patient's medical and social history 2) The patient's current medications and supplements 3) The patient's height, weight, and BMI have been recorded in the chart   Vitamin D deficiency Last vitamin D Lab Results  Component Value Date   VD25OH 20.53 (L) 10/29/2022   Low, to start oral replacement   Hyperlipidemia Lab Results  Component Value Date   LDLCALC 88 10/29/2022   Stable, pt to continue current statin lipitor 20 qd   Elevated PTHrP level Much worsening overall mild in past, pt requests endo referral  ANEMIA-IRON DEFICIENCY No overt bleeding, for f/u iron with labs  Acute dysfunction of right eustachian tube Mild to mod, for mucinex bid prn,  to f/u any worsening symptoms or  concerns  Anxiety Worsening stress recently, declines referral or change in tx for now  Followup: Return in about 1 year (around 11/05/2023).  Oliver Barre, MD 11/09/2022 7:07 AM [facility] [place] [facility]

## 2022-11-09 ENCOUNTER — Encounter: Payer: Self-pay | Admitting: Internal Medicine

## 2022-11-09 DIAGNOSIS — H6991 Unspecified Eustachian tube disorder, right ear: Secondary | ICD-10-CM | POA: Insufficient documentation

## 2022-11-09 NOTE — Assessment & Plan Note (Signed)
Much worsening overall mild in past, pt requests endo referral

## 2022-11-09 NOTE — Assessment & Plan Note (Signed)
Age and sex appropriate education and counseling updated with regular exercise and diet Referrals for preventative services - for DXA at [facility]  Immunizations addressed - for flu at work, for rsv at pharmacy, for prevnar 20 today Smoking counseling  - none needed Evidence for depression or other mood disorder - increased anxiety and stress over baseline with social stressors, declines counseling or change in tx for now Most recent labs reviewed I have personally reviewed and have noted: 1) the patient's medical and social history 2) The patient's current medications and supplements 3) The patient's height, weight, and BMI have been recorded in the chart

## 2022-11-09 NOTE — Assessment & Plan Note (Signed)
No overt bleeding, for f/u iron with labs

## 2022-11-09 NOTE — Assessment & Plan Note (Signed)
Worsening stress recently, declines referral or change in tx for now

## 2022-11-09 NOTE — Assessment & Plan Note (Signed)
Lab Results  Component Value Date   LDLCALC 88 10/29/2022   Stable, pt to continue current statin lipitor 20 qd

## 2022-11-09 NOTE — Assessment & Plan Note (Signed)
Last vitamin D Lab Results  Component Value Date   VD25OH 20.53 (L) 10/29/2022   Low, to start oral replacement

## 2022-11-09 NOTE — Assessment & Plan Note (Signed)
Mild to mod, for mucinex bid prn,  to f/u any worsening symptoms or concerns 

## 2022-11-10 ENCOUNTER — Encounter: Payer: Self-pay | Admitting: Internal Medicine

## 2022-11-25 ENCOUNTER — Other Ambulatory Visit: Payer: Self-pay | Admitting: Rheumatology

## 2022-11-25 NOTE — Telephone Encounter (Signed)
Last Fill: 07/30/2022   Next Visit: 12/03/2022   Last Visit: 06/01/2022   Dx:  Fibromyalgia syndrome    Current Dose per office note on 06/01/2022: Lyrica 150 mg 1 capsule by mouth twice daily    Okay to refill Lyrica?

## 2022-11-26 NOTE — Progress Notes (Signed)
Office Visit Note  Patient: Gabrielle Benson             Date of Birth: 09-11-1955           MRN: [id number]             PCP: Corwin Levins, MD Referring: Corwin Levins, MD Visit Date: 12/10/2022 Occupation: @GUAROCC @  Subjective:  Trochanteric bursitis of both hips   History of Present Illness: Aleiya Kistner is a 67 y.o. female with history of fibromyalgia, osteoarthritis, and DDD.  Patient presents today with discomfort on the lateral aspect of both hips consistent with trochanter bursitis.  She had bilateral trochanteric bursa cortisone injections performed on 06/01/2022 which provided significant relief but her symptoms have started to recur.  She has been performing home exercises but her symptoms have been persistent.  Patient has requested bilateral trochanteric bursa cortisone injections today.   She remains on Lyrica as prescribed and takes methocarbamol as needed for muscle spasms.  She uses Voltaren gel topically as needed for pain relief.   Activities of Daily Living:  Patient reports morning stiffness for 2-3 hours.   Patient Reports nocturnal pain.  Difficulty dressing/grooming: Denies Difficulty climbing stairs: Denies Difficulty getting out of chair: Denies Difficulty using hands for taps, buttons, cutlery, and/or writing: Reports  Review of Systems  Constitutional:  Negative for fatigue.  HENT:  Negative for mouth sores and mouth dryness.   Eyes:  Negative for dryness.  Respiratory:  Negative for cough, shortness of breath and wheezing.   Cardiovascular:  Negative for chest pain and palpitations.  Gastrointestinal:  Positive for constipation. Negative for blood in stool and diarrhea.  Endocrine: Negative for increased urination.  Genitourinary:  Negative for involuntary urination.  Musculoskeletal:  Positive for joint pain, joint pain, joint swelling, myalgias, muscle weakness, morning stiffness, muscle tenderness and myalgias. Negative for gait problem.   Skin:  Negative for color change, rash, hair loss and sensitivity to sunlight.  Allergic/Immunologic: Positive for susceptible to infections.  Neurological:  Positive for numbness. Negative for dizziness and headaches.  Hematological:  Negative for swollen glands.  Psychiatric/Behavioral:  Positive for sleep disturbance. Negative for depressed mood. The patient is nervous/anxious.     PMFS History:  Patient Active Problem List   Diagnosis Date Noted   Acute dysfunction of right eustachian tube 11/09/2022   Bilateral leg pain 07/18/2022   Elevated PTHrP level 11/02/2021   Depression 11/02/2021   Hypercalcemia 10/23/2019   Acute upper respiratory infection 09/30/2017   DJD (degenerative joint disease), cervical 10/05/2016   DDD (degenerative disc disease), lumbar 10/05/2016   Primary osteoarthritis of left knee 10/05/2016   Acute left-sided low back pain 03/25/2016   Pain, joint, multiple sites 03/25/2016   History of insomnia 03/25/2016   Greater trochanteric bursitis of both hips 03/25/2016   Chronic pain syndrome 10/11/2015   Memory dysfunction 04/01/2015   Status post right partial knee replacement 10/07/2014   GERD (gastroesophageal reflux disease) 11/02/2012   Anxiety 07/01/2011   Vitamin D deficiency 03/18/2011   Encounter for well adult exam with abnormal findings 03/13/2011   PERIPHERAL NEUROPATHY 10/03/2007   Hyperlipidemia 01/05/2007   ANEMIA-IRON DEFICIENCY 01/05/2007   INSOMNIA-SLEEP DISORDER-UNSPEC 01/05/2007   Allergic rhinitis 01/05/2007   Irritable bowel syndrome with constipation 01/05/2007   Fibromyalgia syndrome 01/05/2007    Past Medical History:  Diagnosis Date   Allergic rhinitis    ANA positive    Anemia    Iron deficiency   Anxiety  Cervicalgia    Chronic   Complication of anesthesia    hard to go to sleep with Anesthesia-had to have Benadryl with Colonoscopy   DDD (degenerative disc disease)    Cervical   DJD (degenerative joint disease)     Fibromyalgia    GERD (gastroesophageal reflux disease)    Hyperlipidemia    Multinodular thyroid    Neuropathy, peripheral    Pneumonia    Vitamin D deficiency 03/18/2011    Family History  Problem Relation Age of Onset   Heart disease Father    Sickle cell trait Other    Lung cancer Other    Colon cancer Other        Aunt   Diabetes Mother    Healthy Daughter    Healthy Son    Stomach cancer Neg Hx    Rectal cancer Neg Hx    Esophageal cancer Neg Hx    Past Surgical History:  Procedure Laterality Date   ABDOMINAL HYSTERECTOMY     ABDOMINAL HYSTERECTOMY     partial   BUNIONECTOMY  2011   left foot   DILATION AND CURETTAGE OF UTERUS     KNEE ARTHROSCOPY  2004   right   MYOMECTOMY  2002   prior to hysterectomy   PARTIAL KNEE ARTHROPLASTY Right 10/07/2014   Procedure: RIGHT UNI KNEE ARTHROPLASTY LATERALLY ;  Surgeon: Durene Romans, MD;  Location: [facility];  Service: Orthopedics;  Laterality: Right;   TONSILLECTOMY     age 44   TOTAL SHOULDER ARTHROPLASTY Left 04/28/2017   Social History   Social History Narrative   Married 2 kids   Receptionist at [employer] History  Administered Date(s) Administered   Influenza Whole 11/22/2006   Influenza, High Dose Seasonal PF 04/15/2017   Influenza,inj,Quad PF,6+ Mos 10/29/2020   Influenza-Unspecified 11/22/2016, 10/18/2017, 11/02/2018, 11/13/2019, 10/18/2020, 10/29/2021, 11/10/2022   PFIZER(Purple Top)SARS-COV-2 Vaccination 04/28/2019, 05/21/2019, 06/29/2019, 02/23/2020   PNEUMOCOCCAL CONJUGATE-20 11/05/2022   Td 01/19/1992, 09/06/2014   Tdap 07/01/2011   Zoster Recombinant(Shingrix) 10/23/2019, 01/04/2020     Objective: Vital Signs: BP 114/75 (BP Location: Right Arm, Patient Position: Sitting, Cuff Size: Normal)   Pulse 76   Resp 16   Ht 5\' 3"  (1.6 m)   Wt 188 lb 12.8 oz (85.6 kg)   BMI 33.44 kg/m    Physical Exam Vitals and nursing note reviewed.  Constitutional:      Appearance: She is well-developed.   HENT:     Head: Normocephalic and atraumatic.  Eyes:     Conjunctiva/sclera: Conjunctivae normal.  Cardiovascular:     Rate and Rhythm: Normal rate and regular rhythm.     Heart sounds: Normal heart sounds.  Pulmonary:     Effort: Pulmonary effort is normal.     Breath sounds: Normal breath sounds.  Abdominal:     General: Bowel sounds are normal.     Palpations: Abdomen is soft.  Musculoskeletal:     Cervical back: Normal range of motion.  Lymphadenopathy:     Cervical: No cervical adenopathy.  Skin:    General: Skin is warm and dry.     Capillary Refill: Capillary refill takes less than 2 seconds.  Neurological:     Mental Status: She is alert and oriented to person, place, and time.  Psychiatric:        Behavior: Behavior normal.      Musculoskeletal Exam: C-spine, thoracic spine, and lumbar spine good ROM.  Shoulder joints have good range of motion  with some tenderness over the subacromial bursa bilaterally.  Elbow joints, wrist joints, MCPs, PIPs, DIPs have good range of motion with no synovitis.  Complete fist formation bilaterally.  Hip joints have good range of motion with no groin pain.  Tenderness over bilateral trochanteric bursa.  Knee joints have good range of motion with no warmth or effusion.  Ankle joints have good range of motion with no tenderness or joint swelling.  CDAI Exam: CDAI Score: -- Patient Global: --; Provider Global: -- Swollen: --; Tender: -- Joint Exam 12/10/2022   No joint exam has been documented for this visit   There is currently no information documented on the homunculus. Go to the Rheumatology activity and complete the homunculus joint exam.  Investigation: No additional findings.  Imaging: No results found.  Recent Labs: Lab Results  Component Value Date   WBC 5.0 10/29/2022   HGB 11.7 (L) 10/29/2022   PLT 357.0 10/29/2022   NA 138 10/29/2022   K 4.3 10/29/2022   CL 102 10/29/2022   CO2 28 10/29/2022   GLUCOSE 99  10/29/2022   BUN 5 (L) 10/29/2022   CREATININE 0.60 10/29/2022   BILITOT 0.3 10/29/2022   ALKPHOS 106 10/29/2022   AST 20 10/29/2022   ALT 16 10/29/2022   PROT 7.3 10/29/2022   ALBUMIN 4.4 10/29/2022   CALCIUM 10.5 (H) 10/29/2022   CALCIUM 10.7 (H) 10/29/2022   GFRAA >60 08/28/2016    Speciality Comments: No specialty comments available.  Procedures:  Large Joint Inj: bilateral greater trochanter on 12/10/2022 9:05 AM Indications: pain Details: 27 G 1.5 in needle, lateral approach  Arthrogram: No  Medications (Right): 1.5 mL lidocaine 1 %; 40 mg triamcinolone acetonide 40 MG/ML Aspirate (Right): 0 mL Medications (Left): 1.5 mL lidocaine 1 %; 40 mg triamcinolone acetonide 40 MG/ML Aspirate (Left): 0 mL Outcome: tolerated well, no immediate complications Procedure, treatment alternatives, risks and benefits explained, specific risks discussed. Consent was given by the patient. Immediately prior to procedure a time out was called to verify the correct patient, procedure, equipment, support staff and site/side marked as required. Patient was prepped and draped in the usual sterile fashion.     Allergies: Ampicillin, Duloxetine, and Penicillins   Assessment / Plan:     Visit Diagnoses: Fibromyalgia syndrome: Patient has generalized hyperalgesia and positive tender points on exam.  She has been under increased rest recently which she feels has contributed to some of her increased myalgias and fatigue.  She presents today with trapezius muscle tension and tenderness bilaterally.  She is also having discomfort due to trochanteric bursitis of both hips.  She requested bilateral trochanteric bursa cortisone injections today.  She tolerated procedures well.  Procedure notes were completed above.  She remains on Lyrica as prescribed and has been taking methocarbamol as needed for muscle spasms. Discussed the importance of regular exercise and good sleep hygiene.  She will follow-up in the  office in 6 months or sooner if needed.  Other fatigue: Chronic, stable.  Primary insomnia: Discussed the importance of good sleep hygiene.  Greater trochanteric bursitis of both hips: Patient presents today with tenderness on the lateral aspect of both hips consistent with trochanter bursitis.  She had bilateral trochanteric bursa cortisone injections performed on 06/01/2022 which righted significant relief but her symptoms have recurred.  She has been performing home exercises but her symptoms have persisted.  Different treatment options were discussed today.  She has requested repeat trochanteric bursa cortisone injections today.  She tolerated the  procedures well.  Procedure notes were completed above.  Aftercare was discussed.  She will notify us if her symptoms persist or worsen.  Primary osteoarthritis of left knee: Good range of motion.  No warmth or effusion noted.  Status post right partial knee replacement: Doing well.  No warmth or effusion noted.  DDD (degenerative disc disease), cervical: C-spine has good range of motion.  Some trapezius muscle tension tenderness bilaterally.  Degeneration of intervertebral disc of lumbar region without discogenic back pain or lower extremity pain: Chronic pain.  Other medical conditions are listed as follows:  History of scoliosis  History of neuropathy: She remains on Lyrica as prescribed.  History of chronic pain: She is taking Lyrica as prescribed.  History of anxiety  History of vitamin D deficiency  Osteoporosis screening - updated D[facility]A at [facility] in October 2022 per patient by Dr. Cherly Hensen.  Orders: Orders Placed This Encounter  Procedures   Large Joint Inj   No orders of the defined types were placed in this encounter.   Follow-Up Instructions: Return in about 6 months (around 06/09/2023) for Fibromyalgia, Osteoarthritis.   Gearldine Bienenstock, PA-C  Note - This record has been created using Dragon software.  Chart creation  errors have been sought, but may not always  have been located. Such creation errors do not reflect on  the standard of medical care.

## 2022-12-03 ENCOUNTER — Ambulatory Visit: Payer: 59 | Admitting: Physician Assistant

## 2022-12-10 ENCOUNTER — Ambulatory Visit: Payer: 59 | Attending: Physician Assistant | Admitting: Physician Assistant

## 2022-12-10 ENCOUNTER — Encounter: Payer: Self-pay | Admitting: Physician Assistant

## 2022-12-10 VITALS — BP 114/75 | HR 76 | Resp 16 | Ht 63.0 in | Wt 188.8 lb

## 2022-12-10 DIAGNOSIS — M5030 Other cervical disc degeneration, unspecified cervical region: Secondary | ICD-10-CM

## 2022-12-10 DIAGNOSIS — Z8739 Personal history of other diseases of the musculoskeletal system and connective tissue: Secondary | ICD-10-CM

## 2022-12-10 DIAGNOSIS — R5383 Other fatigue: Secondary | ICD-10-CM | POA: Diagnosis not present

## 2022-12-10 DIAGNOSIS — Z8669 Personal history of other diseases of the nervous system and sense organs: Secondary | ICD-10-CM

## 2022-12-10 DIAGNOSIS — M51369 Other intervertebral disc degeneration, lumbar region without mention of lumbar back pain or lower extremity pain: Secondary | ICD-10-CM

## 2022-12-10 DIAGNOSIS — M797 Fibromyalgia: Secondary | ICD-10-CM

## 2022-12-10 DIAGNOSIS — F5101 Primary insomnia: Secondary | ICD-10-CM | POA: Diagnosis not present

## 2022-12-10 DIAGNOSIS — Z8659 Personal history of other mental and behavioral disorders: Secondary | ICD-10-CM

## 2022-12-10 DIAGNOSIS — Z8639 Personal history of other endocrine, nutritional and metabolic disease: Secondary | ICD-10-CM

## 2022-12-10 DIAGNOSIS — Z87898 Personal history of other specified conditions: Secondary | ICD-10-CM

## 2022-12-10 DIAGNOSIS — M1712 Unilateral primary osteoarthritis, left knee: Secondary | ICD-10-CM

## 2022-12-10 DIAGNOSIS — M7061 Trochanteric bursitis, right hip: Secondary | ICD-10-CM

## 2022-12-10 DIAGNOSIS — Z13820 Encounter for screening for osteoporosis: Secondary | ICD-10-CM

## 2022-12-10 DIAGNOSIS — M7062 Trochanteric bursitis, left hip: Secondary | ICD-10-CM | POA: Diagnosis not present

## 2022-12-10 DIAGNOSIS — Z96651 Presence of right artificial knee joint: Secondary | ICD-10-CM

## 2022-12-10 MED ORDER — TRIAMCINOLONE ACETONIDE 40 MG/ML IJ SUSP
40.0000 mg | INTRAMUSCULAR | Status: AC | PRN
  Administered 2022-12-10: 40 mg via INTRA_ARTICULAR

## 2022-12-10 MED ORDER — LIDOCAINE HCL 1 % IJ SOLN
1.5000 mL | INTRAMUSCULAR | Status: AC | PRN
  Administered 2022-12-10: 1.5 mL

## 2022-12-19 ENCOUNTER — Other Ambulatory Visit: Payer: Self-pay | Admitting: Internal Medicine

## 2022-12-20 ENCOUNTER — Other Ambulatory Visit: Payer: Self-pay

## 2022-12-22 ENCOUNTER — Other Ambulatory Visit: Payer: Self-pay | Admitting: Endocrinology

## 2022-12-22 DIAGNOSIS — E042 Nontoxic multinodular goiter: Secondary | ICD-10-CM

## 2022-12-28 ENCOUNTER — Ambulatory Visit
Admission: RE | Admit: 2022-12-28 | Discharge: 2022-12-28 | Disposition: A | Discharge status: Home / Self Care | Payer: 59 | Source: Ambulatory Visit | Attending: Endocrinology | Admitting: Endocrinology

## 2022-12-28 DIAGNOSIS — E042 Nontoxic multinodular goiter: Secondary | ICD-10-CM

## 2023-01-07 LAB — HM DEXA SCAN

## 2023-01-20 ENCOUNTER — Ambulatory Visit: Payer: Self-pay | Admitting: Internal Medicine

## 2023-01-20 ENCOUNTER — Encounter: Payer: Self-pay | Admitting: Internal Medicine

## 2023-01-20 ENCOUNTER — Telehealth: Payer: 59 | Admitting: Nurse Practitioner

## 2023-01-20 DIAGNOSIS — J069 Acute upper respiratory infection, unspecified: Secondary | ICD-10-CM

## 2023-01-20 MED ORDER — HYDROCODONE BIT-HOMATROP MBR 5-1.5 MG/5ML PO SOLN: 5.0000 mL | Freq: Three times a day (TID) | ORAL | 0 refills | Status: DC | PRN

## 2023-01-20 MED ORDER — AZITHROMYCIN 250 MG PO TABS: ORAL_TABLET | ORAL | 0 refills | Status: AC

## 2023-01-20 NOTE — Telephone Encounter (Signed)
  Chief Complaint: Cough/Congestion Symptoms: Cough, congestion Frequency: Since Sunday Pertinent Negatives: Patient denies fever, SOB Disposition: [] ED /[] Urgent Care (no appt availability in office) / [x] Appointment(In office/virtual)/ []  [facility]/ [] Home Care/ [] Refused Recommended Disposition /[] [facility]/ []  Follow-up with PCP Additional Notes: Pt reports she has been experiencing chest congestion, productive cough since Sunday. She reports sputum started out as green but has since almost become clear. She reports that she still has a persistent cough that is interfering with work/sleep. Pt reports she has been taking Dayquil/Nyquil severe as instructed with some improvement. Pt denies fever/chills. Pt requests antibiotic and cough syrup, virtual visit scheduled. This RN educated pt on home care, new or worsening symptoms, when to call back/seek emergent care. Pt verbalized understanding and agrees to plan.   Reason for Disposition  [1] Continuous (nonstop) coughing interferes with work or school AND [2] no improvement using cough treatment per Care Advice  Answer Assessment - Initial Assessment Questions 1. ONSET: When did the cough begin?      About 1 week, since Sunday 2. SEVERITY: How bad is the cough today?      Coughing episodes, worse at night 3. SPUTUM: Describe the color of your sputum (none, dry cough; clear, white, yellow, green)     Green, improving color, more dry hacking cough now 4. HEMOPTYSIS: Are you coughing up any blood? If so ask: How much? (flecks, streaks, tablespoons, etc.)     No 5. DIFFICULTY BREATHING: Are you having difficulty breathing? If Yes, ask: How bad is it? (e.g., mild, moderate, severe)    - MILD: No SOB at rest, mild SOB with walking, speaks normally in sentences, can lie down, no retractions, pulse < 100.    - MODERATE: SOB at rest, SOB with minimal exertion and prefers to sit, cannot lie down flat, speaks in  phrases, mild retractions, audible wheezing, pulse 100-120.    - SEVERE: Very SOB at rest, speaks in single words, struggling to breathe, sitting hunched forward, retractions, pulse > 120      None 6. FEVER: Do you have a fever? If Yes, ask: What is your temperature, how was it measured, and when did it start?  10. OTHER SYMPTOMS: Do you have any other symptoms? (e.g., runny nose, wheezing, chest pain)       Congestion, HA  Protocols used: Cough - Acute Productive-A-AH

## 2023-01-20 NOTE — Telephone Encounter (Signed)
 Called back for first attempt to traige. No answer. Left VM.   Copied from CRM [id number]. Topic: Clinical - Pink Word Triage >> Jan 20, 2023 10:59 AM Drema MATSU wrote: Reason for Triage: Patient has a cold with green mucus and is very stopped up with a little white film in the back of throat.  She is requesting an antibiotic and cough medicine that doctor prescribed her before.

## 2023-01-20 NOTE — Progress Notes (Signed)
 Established Patient Office Visit  An audio/visual tele-health visit was completed today for this patient. I connected with  Gabrielle Benson on 01/20/23 utilizing audio/visual technology and verified that I am speaking with the correct person using two identifiers. The patient was located at their home, and I was located at the office of [facility] at [facility] during the encounter. I discussed the limitations of evaluation and management by telemedicine. The patient expressed understanding and agreed to proceed.    Subjective   Patient ID: Gabrielle Benson, female    DOB: [birth year]  Age: 68 y.o. MRN: [id number]  Chief Complaint  Patient presents with   Cough    Been going on since Sunday, Coughing, no body aches, some fatigue  Been taking severe Nyquil which has helped     Symptom onset 5 days ago.  Reports symptoms initially started with sore throat and congestion, throat had white patches in the back.  She is also having a productive cough, sputum is of green color.  Had a headache earlier this week but this has now resolved.  Has been taking NyQuil and DayQuil as well as using saline nasal spray.  She reports she will get similar symptoms 1-2 times a year and requires an antibiotic for treatment.  She reports that Z-Pak in the past resolves her symptoms.  Per chart review it appears she had similar episode about 3 months ago at which time she was treated with a Z-Pak and tolerated this well.  She is also requesting cough syrup, per chart review it appears that she has been prescribed Hycodan cough syrup in the past she would like a refill of this.  Controlled substance database reviewed today.     Review of Systems  Constitutional:  Negative for chills and fever.  HENT:  Positive for congestion and sore throat (white patches in back of throat per patient).   Respiratory:  Positive for cough and sputum production. Negative for shortness of breath.   Cardiovascular:   Negative for chest pain and palpitations.  Neurological:  Negative for headaches (occured earlier in the week, but is now improving).      Objective:     There were no vitals taken for this visit.   Physical Exam Comprehensive physical exam not completed today as office visit was conducted remotely.  Patient does not appear to be in acute respiratory distress but does cough frequently during visit today.  Patient was alert and oriented, and appeared to have appropriate judgment.   No results found for any visits on 01/20/23.    The 10-year ASCVD risk score (Arnett DK, et al., 2019) is: 7.6%    Assessment & Plan:   Problem List Items Addressed This Visit       Respiratory   Acute upper respiratory infection - Primary   Acute Will treat with Z-Pak and Hycodan cough syrup the patient can take as needed.  Patient was told that because she has had a Z-Pak around 3 months ago treatment failure risk is increased.  Thus if symptoms persist despite antibiotic treatment she should come to office for in person evaluation. She reports understanding.  She was advised to avoid driving or operating heavy machinery while taking Hycodan cough syrup.  She reports understanding.      Relevant Medications   HYDROcodone  bit-homatropine (HYCODAN) 5-1.5 MG/5ML syrup   azithromycin  (ZITHROMAX ) 250 MG tablet    Return if symptoms worsen or fail to improve.    Lauraine FORBES Pereyra,  NP

## 2023-01-20 NOTE — Assessment & Plan Note (Signed)
 Acute Will treat with Z-Pak and Hycodan cough syrup the patient can take as needed.  Patient was told that because she has had a Z-Pak around 3 months ago treatment failure risk is increased.  Thus if symptoms persist despite antibiotic treatment she should come to office for in person evaluation. She reports understanding.  She was advised to avoid driving or operating heavy machinery while taking Hycodan cough syrup.  She reports understanding.

## 2023-01-24 ENCOUNTER — Ambulatory Visit: Payer: 59 | Admitting: Internal Medicine

## 2023-01-28 ENCOUNTER — Other Ambulatory Visit: Payer: Self-pay

## 2023-01-28 ENCOUNTER — Encounter: Payer: Self-pay | Admitting: Internal Medicine

## 2023-01-28 MED ORDER — PANTOPRAZOLE SODIUM 40 MG PO TBEC: 40.0000 mg | DELAYED_RELEASE_TABLET | Freq: Every day | ORAL | 3 refills | Status: DC

## 2023-02-22 ENCOUNTER — Encounter: Payer: Self-pay | Admitting: Internal Medicine

## 2023-02-22 ENCOUNTER — Ambulatory Visit: Payer: 59 | Admitting: Internal Medicine

## 2023-02-22 VITALS — BP 124/80 | HR 78 | Temp 98.2°F | Ht 63.0 in | Wt 179.0 lb

## 2023-02-22 DIAGNOSIS — E559 Vitamin D deficiency, unspecified: Secondary | ICD-10-CM | POA: Diagnosis not present

## 2023-02-22 DIAGNOSIS — E210 Primary hyperparathyroidism: Secondary | ICD-10-CM | POA: Insufficient documentation

## 2023-02-22 DIAGNOSIS — M810 Age-related osteoporosis without current pathological fracture: Secondary | ICD-10-CM | POA: Insufficient documentation

## 2023-02-22 DIAGNOSIS — J069 Acute upper respiratory infection, unspecified: Secondary | ICD-10-CM | POA: Diagnosis not present

## 2023-02-22 DIAGNOSIS — Z0001 Encounter for general adult medical examination with abnormal findings: Secondary | ICD-10-CM

## 2023-02-22 DIAGNOSIS — R062 Wheezing: Secondary | ICD-10-CM | POA: Diagnosis not present

## 2023-02-22 DIAGNOSIS — R7303 Prediabetes: Secondary | ICD-10-CM | POA: Insufficient documentation

## 2023-02-22 DIAGNOSIS — E042 Nontoxic multinodular goiter: Secondary | ICD-10-CM | POA: Insufficient documentation

## 2023-02-22 DIAGNOSIS — R7989 Other specified abnormal findings of blood chemistry: Secondary | ICD-10-CM | POA: Diagnosis not present

## 2023-02-22 MED ORDER — PREDNISONE 10 MG PO TABS: ORAL_TABLET | ORAL | 0 refills | Status: DC

## 2023-02-22 MED ORDER — METHYLPREDNISOLONE ACETATE 40 MG/ML IJ SUSP
40.0000 mg | Freq: Once | INTRAMUSCULAR | Status: AC
  Administered 2023-02-22: 40 mg via INTRAMUSCULAR

## 2023-02-22 MED ORDER — HYDROCODONE BIT-HOMATROP MBR 5-1.5 MG/5ML PO SOLN: 5.0000 mL | Freq: Three times a day (TID) | ORAL | 0 refills | Status: DC | PRN

## 2023-02-22 MED ORDER — AZITHROMYCIN 250 MG PO TABS: ORAL_TABLET | ORAL | 1 refills | Status: AC

## 2023-02-22 NOTE — Patient Instructions (Signed)
 You had the steroid shot today  Please take all new medication as prescribed - the antibiotic, cough medicine, prednisone   Please continue all other medications as before, and refills have been done if requested.  Please have the pharmacy call with any other refills you may need.  Please keep your appointments with your specialists as you may have planned

## 2023-02-22 NOTE — Progress Notes (Signed)
 Patient ID: Gabrielle Benson, female   DOB: 1955/02/01, 68 y.o.   MRN: [id number]         Chief Complaint::  Medical Management of Chronic Issues (Mucus not improving and cough has been lingering on since December 29th )  , elevated PTH, cough, wheezing, low vit d       HPI:  Gabrielle Benson is a 68 y.o. female here overall doing ok but Here with acute onset mild to mod 2-3 days ST, HA, general weakness and malaise, with prod cough greenish sputum, but Pt denies chest pain, increased sob or doe, wheezing, orthopnea, PND, increased LE swelling, palpitations, dizziness or syncope, except for onset mild wheezing last pm.   Also due for f/u PTH and endo soon.  Has cut back on calcium  carbonate.     Wt Readings from Last 3 Encounters:  02/22/23 179 lb (81.2 kg)  12/10/22 188 lb 12.8 oz (85.6 kg)  11/05/22 186 lb (84.4 kg)   BP Readings from Last 3 Encounters:  02/22/23 124/80  12/10/22 114/75  11/05/22 124/76   Immunization History  Administered Date(s) Administered   Influenza Whole 11/22/2006   Influenza, High Dose Seasonal PF 04/15/2017   Influenza,inj,Quad PF,6+ Mos 10/29/2020   Influenza-Unspecified 11/22/2016, 10/18/2017, 11/02/2018, 11/13/2019, 10/18/2020, 10/29/2021, 11/10/2022   PFIZER(Purple Top)SARS-COV-2 Vaccination 04/28/2019, 05/21/2019, 06/29/2019, 02/23/2020   PNEUMOCOCCAL CONJUGATE-20 11/05/2022   Td 01/19/1992, 09/06/2014   Tdap 07/01/2011   Zoster Recombinant(Shingrix) 10/23/2019, 01/04/2020   There are no preventive care reminders to display for this patient.     Past Medical History:  Diagnosis Date   Allergic rhinitis    ANA positive    Anemia    Iron deficiency   Anxiety    Cervicalgia    Chronic   Complication of anesthesia    hard to go to sleep with Anesthesia-had to have Benadryl  with Colonoscopy   DDD (degenerative disc disease)    Cervical   DJD (degenerative joint disease)    Fibromyalgia    GERD (gastroesophageal reflux disease)     Hyperlipidemia    Multinodular thyroid     Neuropathy, peripheral    Pneumonia    Vitamin D  deficiency 03/18/2011   Past Surgical History:  Procedure Laterality Date   ABDOMINAL HYSTERECTOMY     ABDOMINAL HYSTERECTOMY     partial   BUNIONECTOMY  2011   left foot   DILATION AND CURETTAGE OF UTERUS     KNEE ARTHROSCOPY  2004   right   MYOMECTOMY  2002   prior to hysterectomy   PARTIAL KNEE ARTHROPLASTY Right 10/07/2014   Procedure: RIGHT UNI KNEE ARTHROPLASTY LATERALLY ;  Surgeon: Donnice Car, MD;  Location: [facility];  Service: Orthopedics;  Laterality: Right;   TONSILLECTOMY     age 11   TOTAL SHOULDER ARTHROPLASTY Left 04/28/2017    reports that she has never smoked. She has been exposed to tobacco smoke. She has never used smokeless tobacco. She reports current alcohol use of about 3.0 - 4.0 standard drinks of alcohol per week. She reports that she does not use drugs. family history includes Colon cancer in an other family member; Diabetes in her mother; Healthy in her daughter and son; Heart disease in her father; Lung cancer in an other family member; Sickle cell trait in an other family member. Allergies  Allergen Reactions   Ampicillin    Duloxetine     REACTION: dizzy, nervous   Penicillins     Has patient had a PCN reaction causing  immediate rash, facial/tongue/throat swelling, SOB or lightheadedness with hypotension: No Has patient had a PCN reaction causing severe rash involving mucus membranes or skin necrosis: No Has patient had a PCN reaction that required hospitalization: No Has patient had a PCN reaction occurring within the last 10 years: No If all of the above answers are NO, then may proceed with Cephalosporin use.     Current Outpatient Medications on File Prior to Visit  Medication Sig Dispense Refill   atorvastatin  (LIPITOR) 20 MG tablet TAKE 1 TABLET BY MOUTH EVERY DAY 90 tablet 3   azelastine  (ASTELIN ) 0.1 % nasal spray Place 2 sprays into both nostrils  2 (two) times daily. Use in each nostril as directed 30 mL 0   azelastine  (OPTIVAR ) 0.05 % ophthalmic solution 1 drop 2 (two) times daily.     clindamycin (CLEOCIN) 300 MG capsule TAKE 2 CAPSULES BY MOUTH ONE HOUR PRIOR TO DENTAL APPOINTMENT     diclofenac  sodium (VOLTAREN ) 1 % GEL Apply 2-4 grams to affected joint up to 4 times a day as needed 4 Tube 2   levocetirizine (XYZAL ) 5 MG tablet Take 5 mg by mouth every evening.     LINZESS  290 MCG CAPS capsule TAKE 1 CAPSULE BY MOUTH EVERY DAY BEFORE BREAKFAST 30 capsule 0   Menthol , Topical Analgesic, (BIOFREEZE EX) Apply 1 application topically daily as needed (joint pain).      methocarbamol  (ROBAXIN ) 500 MG tablet Take 1 tablet (500 mg total) by mouth daily as needed. 90 tablet 0   naproxen sodium (ALEVE) 220 MG tablet Take 220 mg by mouth as needed.     pantoprazole  (PROTONIX ) 40 MG tablet Take 1 tablet (40 mg total) by mouth daily. 90 tablet 3   pregabalin  (LYRICA ) 150 MG capsule TAKE 1 CAPSULE BY MOUTH TWICE A DAY 180 capsule 0   No current facility-administered medications on file prior to visit.        ROS:  All others reviewed and negative.  Objective        PE:  BP 124/80 (BP Location: Right Arm, Patient Position: Sitting, Cuff Size: Normal)   Pulse 78   Temp 98.2 F (36.8 C) (Oral)   Ht 5' 3 (1.6 m)   Wt 179 lb (81.2 kg)   SpO2 98%   BMI 31.71 kg/m                 Constitutional: Pt appears mild ill               HENT: Head: NCAT.                Right Ear: External ear normal.                 Left Ear: External ear normal. Bilat tm's with mild erythema.  Max sinus areas non tender.  Pharynx with mild erythema, no exudate               Eyes: . Pupils are equal, round, and reactive to light. Conjunctivae and EOM are normal               Nose: without d/c or deformity               Neck: Neck supple. Gross normal ROM               Cardiovascular: Normal rate and regular rhythm.                 Pulmonary/Chest:  Effort normal  and breath sounds without rales but mild bilat wheezing.                Abd:  Soft, NT, ND, + BS, no organomegaly               Neurological: Pt is alert. At baseline orientation, motor grossly intact               Skin: Skin is warm. No rashes, no other new lesions, LE edema - none               Psychiatric: Pt behavior is normal without agitation   Micro: none  Cardiac tracings I have personally interpreted today:  none  Pertinent Radiological findings (summarize): none   Lab Results  Component Value Date   WBC 5.0 10/29/2022   HGB 11.7 (L) 10/29/2022   HCT 37.0 10/29/2022   PLT 357.0 10/29/2022   GLUCOSE 99 10/29/2022   CHOL 172 10/29/2022   TRIG 77.0 10/29/2022   HDL 69.00 10/29/2022   LDLDIRECT 122.7 07/01/2011   LDLCALC 88 10/29/2022   ALT 16 10/29/2022   AST 20 10/29/2022   NA 138 10/29/2022   K 4.3 10/29/2022   CL 102 10/29/2022   CREATININE 0.60 10/29/2022   BUN 5 (L) 10/29/2022   CO2 28 10/29/2022   TSH 1.31 10/29/2022   INR 0.94 10/01/2014   HGBA1C 6.0 10/29/2022   Assessment/Plan:  Gabrielle Benson is a 68 y.o. Black or African American [2] female with  has a past medical history of Allergic rhinitis, ANA positive, Anemia, Anxiety, Cervicalgia, Complication of anesthesia, DDD (degenerative disc disease), DJD (degenerative joint disease), Fibromyalgia, GERD (gastroesophageal reflux disease), Hyperlipidemia, Multinodular thyroid , Neuropathy, peripheral, Pneumonia, and Vitamin D  deficiency (03/18/2011).  Elevated PTHrP level Eleavted oct 2024 - pt to continue f/u with endo as planned, decline f/u lab today  Vitamin D  deficiency Last vitamin D  Lab Results  Component Value Date   VD25OH 20.53 (L) 10/29/2022   Low, to start oral replacement   Acute upper respiratory infection Mild to mod, for antibx course zpack x 1, to f/u any worsening symptoms or concerns  Prediabetes Lab Results  Component Value Date   HGBA1C 6.0 10/29/2022   Stable, pt to  continue current medical treatment  - diet, wt control   Wheezing Mild to mod, for depomedrol IM 80 mg prednisone  taper, in haler prn,,  to f/u any worsening symptoms or concerns  Followup: Return if symptoms worsen or fail to improve.  Lynwood Rush, MD 02/26/2023 9:21 PM [facility]

## 2023-02-26 ENCOUNTER — Encounter: Payer: Self-pay | Admitting: Internal Medicine

## 2023-02-26 DIAGNOSIS — R062 Wheezing: Secondary | ICD-10-CM | POA: Insufficient documentation

## 2023-02-26 NOTE — Assessment & Plan Note (Signed)
Mild to mod, for antibx course zpack x 1,  to f/u any worsening symptoms or concerns

## 2023-02-26 NOTE — Assessment & Plan Note (Addendum)
 Mild to mod, for depomedrol IM 80 mg prednisone  taper, in haler prn,,  to f/u any worsening symptoms or concerns

## 2023-02-26 NOTE — Assessment & Plan Note (Signed)
 Lab Results  Component Value Date   HGBA1C 6.0 10/29/2022   Stable, pt to continue current medical treatment  - diet, wt control

## 2023-02-26 NOTE — Assessment & Plan Note (Signed)
Last vitamin D Lab Results  Component Value Date   VD25OH 20.53 (L) 10/29/2022   Low, to start oral replacement

## 2023-02-26 NOTE — Assessment & Plan Note (Signed)
 Eleavted oct 2024 - pt to continue f/u with endo as planned, decline f/u lab today

## 2023-03-09 ENCOUNTER — Other Ambulatory Visit: Payer: Self-pay | Admitting: Internal Medicine

## 2023-03-09 DIAGNOSIS — J069 Acute upper respiratory infection, unspecified: Secondary | ICD-10-CM

## 2023-03-09 MED ORDER — HYDROCODONE BIT-HOMATROP MBR 5-1.5 MG/5ML PO SOLN: 5.0000 mL | Freq: Three times a day (TID) | ORAL | 0 refills | Status: DC | PRN

## 2023-03-23 ENCOUNTER — Encounter: Payer: Self-pay | Admitting: Internal Medicine

## 2023-03-23 DIAGNOSIS — J069 Acute upper respiratory infection, unspecified: Secondary | ICD-10-CM

## 2023-03-23 MED ORDER — HYDROCODONE BIT-HOMATROP MBR 5-1.5 MG/5ML PO SOLN: 5.0000 mL | Freq: Three times a day (TID) | ORAL | 0 refills | Status: DC | PRN

## 2023-03-24 ENCOUNTER — Other Ambulatory Visit: Payer: Self-pay | Admitting: Internal Medicine

## 2023-03-28 ENCOUNTER — Encounter: Payer: Self-pay | Admitting: Internal Medicine

## 2023-05-27 NOTE — Progress Notes (Signed)
 Office Visit Note  Patient: Gabrielle Benson             Date of Birth: [birth year]           MRN: [id number]             PCP: Roslyn Coombe, MD Referring: Roslyn Coombe, MD Visit Date: 06/10/2023 Occupation: @GUAROCC @  Subjective:  Pain in both hips  History of Present Illness: Gabrielle Benson is a 68 y.o. female with fibromyalgia, osteoarthritis and degenerative disc disease.  She returns today after her last visit in November 2024.  She continues to have some generalized pain, fatigue and insomnia.  She states that insomnia has improved.  She has been having pain and discomfort in her bilateral trochanteric bursa and request injections today.  She had good response to previous injections in November.  She states she has some discomfort in her right partial knee replacement with the weather change.  She has off-and-on discomfort in the left knee joint.  She has some chronic discomfort in her neck and back which is unchanged.  She continues to be on Lyrica  and methocarbamol .    Activities of Daily Living:  Patient reports morning stiffness for several hours.   Patient Reports nocturnal pain.  Difficulty dressing/grooming: Denies Difficulty climbing stairs: Denies Difficulty getting out of chair: Denies Difficulty using hands for taps, buttons, cutlery, and/or writing: Reports  Review of Systems  Constitutional:  Positive for fatigue.  HENT:  Negative for mouth sores and mouth dryness.   Eyes:  Negative for dryness.  Respiratory:  Positive for shortness of breath.        On exertion   Cardiovascular:  Negative for palpitations.  Gastrointestinal:  Positive for constipation. Negative for blood in stool and diarrhea.  Endocrine: Negative for increased urination.  Genitourinary:  Negative for involuntary urination.  Musculoskeletal:  Positive for joint pain, joint pain, myalgias, muscle weakness, morning stiffness, muscle tenderness and myalgias. Negative for gait problem and  joint swelling.  Skin:  Negative for color change, rash, hair loss and sensitivity to sunlight.  Allergic/Immunologic: Negative for susceptible to infections.  Neurological:  Positive for numbness. Negative for dizziness and headaches.  Hematological:  Negative for swollen glands.  Psychiatric/Behavioral:  Positive for sleep disturbance. Negative for depressed mood. The patient is not nervous/anxious.     PMFS History:  Patient Active Problem List   Diagnosis Date Noted   Wheezing 02/26/2023   Multinodular goiter 02/22/2023   Postmenopausal osteoporosis 02/22/2023   Prediabetes 02/22/2023   Primary hyperparathyroidism (HCC) 02/22/2023   Acute dysfunction of right eustachian tube 11/09/2022   Bilateral leg pain 07/18/2022   Elevated PTHrP level 11/02/2021   Depression 11/02/2021   Hypercalcemia 10/23/2019   Acute upper respiratory infection 09/30/2017   DJD (degenerative joint disease), cervical 10/05/2016   DDD (degenerative disc disease), lumbar 10/05/2016   Primary osteoarthritis of left knee 10/05/2016   Acute left-sided low back pain 03/25/2016   Pain, joint, multiple sites 03/25/2016   History of insomnia 03/25/2016   Greater trochanteric bursitis of both hips 03/25/2016   Chronic pain syndrome 10/11/2015   Memory dysfunction 04/01/2015   Status post right partial knee replacement 10/07/2014   GERD (gastroesophageal reflux disease) 11/02/2012   Anxiety 07/01/2011   Vitamin D  deficiency 03/18/2011   Encounter for well adult exam with abnormal findings 03/13/2011   PERIPHERAL NEUROPATHY 10/03/2007   Hyperlipidemia 01/05/2007   ANEMIA-IRON DEFICIENCY 01/05/2007   INSOMNIA-SLEEP DISORDER-UNSPEC 01/05/2007   Allergic rhinitis 01/05/2007  Irritable bowel syndrome with constipation 01/05/2007   Fibromyalgia syndrome 01/05/2007    Past Medical History:  Diagnosis Date   Allergic rhinitis    ANA positive    Anemia    Iron deficiency   Anxiety    Cervicalgia    Chronic    Complication of anesthesia    hard to go to sleep with Anesthesia-had to have Benadryl  with Colonoscopy   DDD (degenerative disc disease)    Cervical   DJD (degenerative joint disease)    Fibromyalgia    GERD (gastroesophageal reflux disease)    Hyperlipidemia    Multinodular thyroid     Neuropathy, peripheral    Pneumonia    Vitamin D  deficiency 03/18/2011    Family History  Problem Relation Age of Onset   Heart disease Father    Sickle cell trait Other    Lung cancer Other    Colon cancer Other        Aunt   Diabetes Mother    Healthy Daughter    Healthy Son    Stomach cancer Neg Hx    Rectal cancer Neg Hx    Esophageal cancer Neg Hx    Past Surgical History:  Procedure Laterality Date   ABDOMINAL HYSTERECTOMY     ABDOMINAL HYSTERECTOMY     partial   BUNIONECTOMY  2011   left foot   DILATION AND CURETTAGE OF UTERUS     KNEE ARTHROSCOPY  2004   right   MYOMECTOMY  2002   prior to hysterectomy   PARTIAL KNEE ARTHROPLASTY Right 10/07/2014   Procedure: RIGHT UNI KNEE ARTHROPLASTY LATERALLY ;  Surgeon: Claiborne Crew, MD;  Location: [facility];  Service: Orthopedics;  Laterality: Right;   TONSILLECTOMY     age 23   TOTAL SHOULDER ARTHROPLASTY Left 04/28/2017   Social History   Social History Narrative   Married 2 kids   Receptionist at [employer] History  Administered Date(s) Administered   Influenza Whole 11/22/2006   Influenza, High Dose Seasonal PF 04/15/2017   Influenza,inj,Quad PF,6+ Mos 10/29/2020   Influenza-Unspecified 11/22/2016, 10/18/2017, 11/02/2018, 11/13/2019, 10/18/2020, 10/29/2021, 11/10/2022   PFIZER(Purple Top)SARS-COV-2 Vaccination 04/28/2019, 05/21/2019, 06/29/2019, 02/23/2020   PNEUMOCOCCAL CONJUGATE-20 11/05/2022   Rsv, Bivalent, Protein Subunit Rsvpref,pf Pattricia Bores) 03/26/2023   Td 01/19/1992, 09/06/2014   Tdap 07/01/2011   Zoster Recombinant(Shingrix) 10/23/2019, 01/04/2020     Objective: Vital Signs: BP 125/85 (BP Location: Left  Arm, Patient Position: Sitting, Cuff Size: Normal)   Pulse 68   Resp 15   Ht 5\' 3"  (1.6 m)   Wt 181 lb 9.6 oz (82.4 kg)   BMI 32.17 kg/m    Physical Exam Vitals and nursing note reviewed.  Constitutional:      Appearance: She is well-developed.  HENT:     Head: Normocephalic and atraumatic.  Eyes:     Conjunctiva/sclera: Conjunctivae normal.  Cardiovascular:     Rate and Rhythm: Normal rate and regular rhythm.     Heart sounds: Normal heart sounds.  Pulmonary:     Effort: Pulmonary effort is normal.     Breath sounds: Normal breath sounds.  Abdominal:     General: Bowel sounds are normal.     Palpations: Abdomen is soft.  Musculoskeletal:     Cervical back: Normal range of motion.  Lymphadenopathy:     Cervical: No cervical adenopathy.  Skin:    General: Skin is warm and dry.     Capillary Refill: Capillary refill takes less than 2 seconds.  Neurological:     Mental Status: She is alert and oriented to person, place, and time.  Psychiatric:        Behavior: Behavior normal.      Musculoskeletal Exam: Cervical, thoracic and lumbar spine were in good range of motion.  Shoulders, elbows, wrist joints, MCPs PIPs and DIPs Juengel range of motion with no synovitis.  Hip joints, knee joints, ankles, MTPs and PIPs with good range of motion with no synovitis.  She had tenderness over bilateral trochanteric region.  She had generalized hyperalgesia and positive tender points.  CDAI Exam: CDAI Score: -- Patient Global: --; Provider Global: -- Swollen: --; Tender: -- Joint Exam 06/10/2023   No joint exam has been documented for this visit   There is currently no information documented on the homunculus. Go to the Rheumatology activity and complete the homunculus joint exam.  Investigation: No additional findings.  Imaging: No results found.  Recent Labs: Lab Results  Component Value Date   WBC 5.0 10/29/2022   HGB 11.7 (L) 10/29/2022   PLT 357.0 10/29/2022   NA 138  10/29/2022   K 4.3 10/29/2022   CL 102 10/29/2022   CO2 28 10/29/2022   GLUCOSE 99 10/29/2022   BUN 5 (L) 10/29/2022   CREATININE 0.60 10/29/2022   BILITOT 0.3 10/29/2022   ALKPHOS 106 10/29/2022   AST 20 10/29/2022   ALT 16 10/29/2022   PROT 7.3 10/29/2022   ALBUMIN 4.4 10/29/2022   CALCIUM  10.5 (H) 10/29/2022   CALCIUM  10.7 (H) 10/29/2022   GFRAA >60 08/28/2016    Speciality Comments: No specialty comments available.  Procedures:  Large Joint Inj: bilateral greater trochanter on 06/10/2023 8:33 AM Indications: pain Details: 27 G 1.5 in needle, lateral approach  Arthrogram: No  Medications (Right): 1.5 mL lidocaine  1 %; 40 mg triamcinolone  acetonide 40 MG/ML Medications (Left): 1.5 mL lidocaine  1 %; 40 mg triamcinolone  acetonide 40 MG/ML Outcome: tolerated well, no immediate complications  Risk of infection, dermal atrophy and hypopigmentation was discussed. Procedure, treatment alternatives, risks and benefits explained, specific risks discussed. Consent was given by the patient. Immediately prior to procedure a time out was called to verify the correct patient, procedure, equipment, support staff and site/side marked as required. Patient was prepped and draped in the usual sterile fashion.     Allergies: Ampicillin, Duloxetine, and Penicillins   Assessment / Plan:     Visit Diagnoses: Fibromyalgia syndrome -she continues to have some generalized pain, hyperalgesia and positive tender points.  She states her symptoms are manageable on Lyrica  and methocarbamol .  She denies any side effects from the medications.  She has not been exercising on a regular basis.  She was encouraged to join the gym and consider water aerobics, stretching exercises.  She also needs strength training exercises.  Primary insomnia-good sleep hygiene was discussed.  Other fatigue-most likely related to fibromyalgia and insomnia.  Greater trochanteric bursitis of both hips-she has been having pain  and discomfort over bilateral trochanteric region.  She states she has been trying to do some IT band stretches but not on a regular basis.  She had good response to cortisone injections in November 2024.  Per patient's request bilateral trochanteric bursa were injected as described above.  Postprocedure instructions were given.  Primary osteoarthritis of left knee-she reports off-and-on discomfort.  Status post right partial knee replacement-doing well and had good range of motion.  Patient states she has some discomfort with the weather change.  DDD (degenerative disc disease),  cervical-she had good range of motion without discomfort.  Degeneration of intervertebral disc of lumbar region without discogenic back pain or lower extremity pain-she can continues to have intermittent discomfort.  Core strengthening exercises were emphasized.  Other medical problems are listed as follows:  History of scoliosis  History of neuropathy  History of chronic pain  History of anxiety  History of vitamin D  deficiency - 10/24 vitamin D  20.53 She was advised to take vitamin D  2000 units daily.  Osteopenia of multiple sites - January 07, 2023 T-score -1.8, BMD 0.584 the left radius one third.  DEXA results were discussed.  Benefits of daily exercise, resistance training and vitamin D  intake was discussed.  Hypercalcemia-patient is an appointment coming up with her PCP.  Patient states her calcium  was high due to taking Tums.  Orders: No orders of the defined types were placed in this encounter.  No orders of the defined types were placed in this encounter.    Follow-Up Instructions: Return in about 6 months (around 12/11/2023) for Osteoarthritis.   Nicholas Bari, MD  Note - This record has been created using Animal nutritionist.  Chart creation errors have been sought, but may not always  have been located. Such creation errors do not reflect on  the standard of medical care.

## 2023-06-10 ENCOUNTER — Ambulatory Visit: Payer: 59 | Attending: Rheumatology | Admitting: Rheumatology

## 2023-06-10 ENCOUNTER — Encounter: Payer: Self-pay | Admitting: Rheumatology

## 2023-06-10 VITALS — BP 125/85 | HR 68 | Resp 15 | Ht 63.0 in | Wt 181.6 lb

## 2023-06-10 DIAGNOSIS — Z8739 Personal history of other diseases of the musculoskeletal system and connective tissue: Secondary | ICD-10-CM

## 2023-06-10 DIAGNOSIS — Z8669 Personal history of other diseases of the nervous system and sense organs: Secondary | ICD-10-CM

## 2023-06-10 DIAGNOSIS — Z8659 Personal history of other mental and behavioral disorders: Secondary | ICD-10-CM

## 2023-06-10 DIAGNOSIS — R5383 Other fatigue: Secondary | ICD-10-CM | POA: Diagnosis not present

## 2023-06-10 DIAGNOSIS — Z13820 Encounter for screening for osteoporosis: Secondary | ICD-10-CM

## 2023-06-10 DIAGNOSIS — Z87898 Personal history of other specified conditions: Secondary | ICD-10-CM

## 2023-06-10 DIAGNOSIS — M51369 Other intervertebral disc degeneration, lumbar region without mention of lumbar back pain or lower extremity pain: Secondary | ICD-10-CM

## 2023-06-10 DIAGNOSIS — Z8639 Personal history of other endocrine, nutritional and metabolic disease: Secondary | ICD-10-CM

## 2023-06-10 DIAGNOSIS — M797 Fibromyalgia: Secondary | ICD-10-CM

## 2023-06-10 DIAGNOSIS — F5101 Primary insomnia: Secondary | ICD-10-CM

## 2023-06-10 DIAGNOSIS — M5030 Other cervical disc degeneration, unspecified cervical region: Secondary | ICD-10-CM

## 2023-06-10 DIAGNOSIS — M7061 Trochanteric bursitis, right hip: Secondary | ICD-10-CM

## 2023-06-10 DIAGNOSIS — M1712 Unilateral primary osteoarthritis, left knee: Secondary | ICD-10-CM

## 2023-06-10 DIAGNOSIS — M7062 Trochanteric bursitis, left hip: Secondary | ICD-10-CM

## 2023-06-10 DIAGNOSIS — Z96651 Presence of right artificial knee joint: Secondary | ICD-10-CM

## 2023-06-10 DIAGNOSIS — M8589 Other specified disorders of bone density and structure, multiple sites: Secondary | ICD-10-CM

## 2023-06-10 MED ORDER — TRIAMCINOLONE ACETONIDE 40 MG/ML IJ SUSP
40.0000 mg | INTRAMUSCULAR | Status: AC | PRN
  Administered 2023-06-10: 40 mg via INTRA_ARTICULAR

## 2023-06-10 MED ORDER — LIDOCAINE HCL 1 % IJ SOLN
1.5000 mL | INTRAMUSCULAR | Status: AC | PRN
  Administered 2023-06-10: 1.5 mL

## 2023-11-09 ENCOUNTER — Other Ambulatory Visit (INDEPENDENT_AMBULATORY_CARE_PROVIDER_SITE_OTHER)

## 2023-11-09 DIAGNOSIS — R7989 Other specified abnormal findings of blood chemistry: Secondary | ICD-10-CM | POA: Diagnosis not present

## 2023-11-09 DIAGNOSIS — E7800 Pure hypercholesterolemia, unspecified: Secondary | ICD-10-CM | POA: Diagnosis not present

## 2023-11-09 DIAGNOSIS — E559 Vitamin D deficiency, unspecified: Secondary | ICD-10-CM | POA: Diagnosis not present

## 2023-11-09 DIAGNOSIS — E538 Deficiency of other specified B group vitamins: Secondary | ICD-10-CM | POA: Diagnosis not present

## 2023-11-09 DIAGNOSIS — R739 Hyperglycemia, unspecified: Secondary | ICD-10-CM | POA: Diagnosis not present

## 2023-11-09 LAB — CBC WITH DIFFERENTIAL/PLATELET
Basophils Absolute: 0 K/uL (ref 0.0–0.1)
Basophils Relative: 0.7 % (ref 0.0–3.0)
Eosinophils Absolute: 0.1 K/uL (ref 0.0–0.7)
Eosinophils Relative: 1.1 % (ref 0.0–5.0)
HCT: 36.7 % (ref 36.0–46.0)
Hemoglobin: 11.7 g/dL — ABNORMAL LOW (ref 12.0–15.0)
Lymphocytes Relative: 29.9 % (ref 12.0–46.0)
Lymphs Abs: 1.6 K/uL (ref 0.7–4.0)
MCHC: 31.8 g/dL (ref 30.0–36.0)
MCV: 83.9 fl (ref 78.0–100.0)
Monocytes Absolute: 0.4 K/uL (ref 0.1–1.0)
Monocytes Relative: 7.8 % (ref 3.0–12.0)
Neutro Abs: 3.3 K/uL (ref 1.4–7.7)
Neutrophils Relative %: 60.5 % (ref 43.0–77.0)
Platelets: 361 K/uL (ref 150.0–400.0)
RBC: 4.37 Mil/uL (ref 3.87–5.11)
RDW: 13.5 % (ref 11.5–15.5)
WBC: 5.5 K/uL (ref 4.0–10.5)

## 2023-11-09 LAB — HEPATIC FUNCTION PANEL
ALT: 20 U/L (ref 0–35)
AST: 25 U/L (ref 0–37)
Albumin: 4.4 g/dL (ref 3.5–5.2)
Alkaline Phosphatase: 99 U/L (ref 39–117)
Bilirubin, Direct: 0.1 mg/dL (ref 0.0–0.3)
Total Bilirubin: 0.4 mg/dL (ref 0.2–1.2)
Total Protein: 7.7 g/dL (ref 6.0–8.3)

## 2023-11-09 LAB — VITAMIN B12: Vitamin B-12: 841 pg/mL (ref 211–911)

## 2023-11-09 LAB — LIPID PANEL
Cholesterol: 175 mg/dL (ref 0–200)
HDL: 65.8 mg/dL (ref >39.00)
LDL Cholesterol: 84 mg/dL (ref 0–99)
NonHDL: 109.55
Total CHOL/HDL Ratio: 3
Triglycerides: 126 mg/dL (ref 0.0–149.0)
VLDL: 25.2 mg/dL (ref 0.0–40.0)

## 2023-11-09 LAB — BASIC METABOLIC PANEL WITH GFR
BUN: 10 mg/dL (ref 6–23)
CO2: 32 meq/L (ref 19–32)
Calcium: 10.5 mg/dL (ref 8.4–10.5)
Chloride: 101 meq/L (ref 96–112)
Creatinine, Ser: 0.67 mg/dL (ref 0.40–1.20)
GFR: 91.11 mL/min (ref >60.00)
Glucose, Bld: 124 mg/dL — ABNORMAL HIGH (ref 70–99)
Potassium: 3.6 meq/L (ref 3.5–5.1)
Sodium: 139 meq/L (ref 135–145)

## 2023-11-09 LAB — MICROALBUMIN / CREATININE URINE RATIO
Creatinine,U: 171.7 mg/dL
Microalb Creat Ratio: 9.7 mg/g (ref 0.0–30.0)
Microalb, Ur: 1.7 mg/dL (ref 0.0–1.9)

## 2023-11-09 LAB — VITAMIN D 25 HYDROXY (VIT D DEFICIENCY, FRACTURES): VITD: 25.02 ng/mL — ABNORMAL LOW (ref 30.00–100.00)

## 2023-11-09 LAB — HEMOGLOBIN A1C: Hgb A1c MFr Bld: 6 % (ref 4.6–6.5)

## 2023-11-09 LAB — TSH: TSH: 0.99 u[IU]/mL (ref 0.35–5.50)

## 2023-11-10 LAB — URINALYSIS, ROUTINE W REFLEX MICROSCOPIC
Bilirubin Urine: NEGATIVE
Hgb urine dipstick: NEGATIVE
Nitrite: NEGATIVE
RBC / HPF: NONE SEEN (ref 0–?)
Specific Gravity, Urine: >=1.03 — AB (ref 1.000–1.030)
Total Protein, Urine: NEGATIVE
Urine Glucose: NEGATIVE
Urobilinogen, UA: 0.2 (ref 0.0–1.0)
pH: 6 (ref 5.0–8.0)

## 2023-11-10 LAB — PTH, INTACT AND CALCIUM
Calcium: 9.4 mg/dL (ref 8.6–10.4)
PTH: 83 pg/mL — ABNORMAL HIGH (ref 16–77)

## 2023-11-11 ENCOUNTER — Encounter: Payer: Self-pay | Admitting: Internal Medicine

## 2023-11-11 ENCOUNTER — Ambulatory Visit: Payer: 59 | Admitting: Internal Medicine

## 2023-11-11 VITALS — BP 122/78 | HR 80 | Temp 98.0°F | Ht 63.0 in | Wt 180.0 lb

## 2023-11-11 DIAGNOSIS — E7800 Pure hypercholesterolemia, unspecified: Secondary | ICD-10-CM | POA: Diagnosis not present

## 2023-11-11 DIAGNOSIS — Z0000 Encounter for general adult medical examination without abnormal findings: Secondary | ICD-10-CM | POA: Diagnosis not present

## 2023-11-11 DIAGNOSIS — E559 Vitamin D deficiency, unspecified: Secondary | ICD-10-CM

## 2023-11-11 DIAGNOSIS — Z0001 Encounter for general adult medical examination with abnormal findings: Secondary | ICD-10-CM

## 2023-11-11 DIAGNOSIS — R7303 Prediabetes: Secondary | ICD-10-CM

## 2023-11-11 DIAGNOSIS — R739 Hyperglycemia, unspecified: Secondary | ICD-10-CM

## 2023-11-11 DIAGNOSIS — E538 Deficiency of other specified B group vitamins: Secondary | ICD-10-CM

## 2023-11-11 DIAGNOSIS — R7989 Other specified abnormal findings of blood chemistry: Secondary | ICD-10-CM

## 2023-11-11 DIAGNOSIS — F32A Depression, unspecified: Secondary | ICD-10-CM | POA: Diagnosis not present

## 2023-11-11 MED ORDER — ATORVASTATIN CALCIUM 20 MG PO TABS: 20.0000 mg | ORAL_TABLET | Freq: Every day | ORAL | 3 refills | Status: AC

## 2023-11-11 NOTE — Assessment & Plan Note (Signed)
 Lab Results  Component Value Date   LDLCALC 84 11/09/2023   Stable, pt to continue current statin lipitor 20 mg qd

## 2023-11-11 NOTE — Patient Instructions (Signed)
 Ok to increase the Vitamin D  to 4000 units per day  Please continue all other medications as before, and refills have been done if requested.  Please have the pharmacy call with any other refills you may need.  Please continue your efforts at being more active, low cholesterol diet, and weight control.  You are otherwise up to date with prevention measures today.  Please keep your appointments with your specialists as you may have planned - rheumatology as planned  Your health form is signed today  Your Lab tests were good!  Please make an Appointment to return for your 1 year visit, or sooner if needed, with Lab testing by Appointment as well, to be done about 3-5 days before at the FIRST FLOOR Lab (so this is for TWO appointments - please see the scheduling desk as you leave)

## 2023-11-11 NOTE — Assessment & Plan Note (Signed)
 Lab Results  Component Value Date   HGBA1C 6.0 11/09/2023   Stable, pt to continue current medical treatment  - diet, wt control

## 2023-11-11 NOTE — Assessment & Plan Note (Signed)
Chronic stable, cont current med tx °

## 2023-11-11 NOTE — Progress Notes (Signed)
 Patient ID: Gabrielle Benson, female   DOB: 04-03-1955, 68 y.o.   MRN: [id number]         Chief Complaint:: wellness exam and low vit d, hld, elevated PTH and calcium        HPI:  Gabrielle Benson is a 68 y.o. female here for wellness exam; has mammogram sched for next Monday; o/w up to date                        [facility].  Pt denies chest pain, increased sob or doe, wheezing, orthopnea, PND, increased LE swelling, palpitations, dizziness or syncope.   Pt denies polydipsia, polyuria, or new focal neuro s/s, except subjectively increased distal leg numbness possibly c/w her neuropathy.    Pt denies fever, wt loss, night sweats, loss of appetite, or other constitutional symptoms  Taking 2000 u vit d daily  Denies worsening depressive symptoms, suicidal ideation, or panic   Wt Readings from Last 3 Encounters:  11/11/23 180 lb (81.6 kg)  06/10/23 181 lb 9.6 oz (82.4 kg)  02/22/23 179 lb (81.2 kg)   BP Readings from Last 3 Encounters:  11/11/23 122/78  06/10/23 125/85  02/22/23 124/80   Immunization History  Administered Date(s) Administered    sv, Bivalent, Protein Subunit Rsvpref,pf [place]) 03/26/2023   INFLUENZA, HIGH DOSE SEASONAL PF 04/15/2017   Influenza Whole 11/22/2006   Influenza,inj,Quad PF,6+ Mos 10/29/2020, 10/26/2023   Influenza-Unspecified 11/22/2016, 10/18/2017, 11/02/2018, 11/13/2019, 10/18/2020, 10/29/2021, 11/10/2022   PFIZER(Purple Top)SARS-COV-2 Vaccination 04/28/2019, 05/21/2019, 06/29/2019, 02/23/2020   PNEUMOCOCCAL CONJUGATE-20 11/05/2022   Td 01/19/1992, 09/06/2014   Tdap 07/01/2011   Zoster Recombinant(Shingrix) 10/23/2019, 01/04/2020   Health Maintenance Due  Topic Date Due   Mammogram  11/08/2023      Past Medical History:  Diagnosis Date   Allergic rhinitis    ANA positive    Anemia    Iron deficiency   Anxiety    Cervicalgia    Chronic   Complication of anesthesia    hard to go to sleep with Anesthesia-had  to have Benadryl  with Colonoscopy   DDD (degenerative disc disease)    Cervical   DJD (degenerative joint disease)    Fibromyalgia    GERD (gastroesophageal reflux disease)    Hyperlipidemia    Multinodular thyroid     Neuropathy, peripheral    Pneumonia    Vitamin D  deficiency 03/18/2011   Past Surgical History:  Procedure Laterality Date   ABDOMINAL HYSTERECTOMY     ABDOMINAL HYSTERECTOMY     partial   BUNIONECTOMY  2011   left foot   DILATION AND CURETTAGE OF UTERUS     KNEE ARTHROSCOPY  2004   right   MYOMECTOMY  2002   prior to hysterectomy   PARTIAL KNEE ARTHROPLASTY Right 10/07/2014   Procedure: RIGHT UNI KNEE ARTHROPLASTY LATERALLY ;  Surgeon: Donnice Car, MD;  Location: [facility];  Service: Orthopedics;  Laterality: Right;   TONSILLECTOMY     age 82   TOTAL SHOULDER ARTHROPLASTY Left 04/28/2017    reports that she has never smoked. She has been exposed to tobacco smoke. She has never used smokeless tobacco. She reports current alcohol use of about 3.0 - 4.0 standard drinks of alcohol per week. She reports that she does not use drugs. family history includes Colon cancer in an other family member; Diabetes in her mother; Healthy in her daughter and son; Heart disease in her father; Lung cancer in  an other family member; Sickle cell trait in an other family member. Allergies  Allergen Reactions   Ampicillin    Duloxetine     REACTION: dizzy, nervous   Penicillins     Has patient had a PCN reaction causing immediate rash, facial/tongue/throat swelling, SOB or lightheadedness with hypotension: No Has patient had a PCN reaction causing severe rash involving mucus membranes or skin necrosis: No Has patient had a PCN reaction that required hospitalization: No Has patient had a PCN reaction occurring within the last 10 years: No If all of the above answers are NO, then may proceed with Cephalosporin use.     Current Outpatient Medications on File Prior to Visit   Medication Sig Dispense Refill   azelastine  (ASTELIN ) 0.1 % nasal spray Place 2 sprays into both nostrils 2 (two) times daily. Use in each nostril as directed 30 mL 0   clindamycin (CLEOCIN) 300 MG capsule TAKE 2 CAPSULES BY MOUTH ONE HOUR PRIOR TO DENTAL APPOINTMENT     diclofenac  sodium (VOLTAREN ) 1 % GEL Apply 2-4 grams to affected joint up to 4 times a day as needed 4 Tube 2   levocetirizine (XYZAL ) 5 MG tablet Take 5 mg by mouth every evening.     Menthol , Topical Analgesic, (BIOFREEZE EX) Apply 1 application topically daily as needed (joint pain).      methocarbamol  (ROBAXIN ) 500 MG tablet Take 1 tablet (500 mg total) by mouth daily as needed. 90 tablet 0   naproxen sodium (ALEVE) 220 MG tablet Take 220 mg by mouth as needed.     pantoprazole  (PROTONIX ) 40 MG tablet Take 1 tablet (40 mg total) by mouth daily. 90 tablet 3   pregabalin  (LYRICA ) 150 MG capsule TAKE 1 CAPSULE BY MOUTH TWICE A DAY 180 capsule 0   No current facility-administered medications on file prior to visit.        ROS:  All others reviewed and negative.  Objective        PE:  BP 122/78 (BP Location: Right Arm, Patient Position: Sitting, Cuff Size: Normal)   Pulse 80   Temp 98 F (36.7 C) (Oral)   Ht 5' 3 (1.6 m)   Wt 180 lb (81.6 kg)   SpO2 98%   BMI 31.89 kg/m                 Constitutional: Pt appears in NAD               HENT: Head: NCAT.                Right Ear: External ear normal.                 Left Ear: External ear normal.                Eyes: . Pupils are equal, round, and reactive to light. Conjunctivae and EOM are normal               Nose: without d/c or deformity               Neck: Neck supple. Gross normal ROM               Cardiovascular: Normal rate and regular rhythm.                 Pulmonary/Chest: Effort normal and breath sounds without rales or wheezing.                Abd:  Soft, NT, ND, + BS, no organomegaly               Neurological: Pt is alert. At baseline orientation,  motor grossly intact               Skin: Skin is warm. No rashes, no other new lesions, LE edema - none               Psychiatric: Pt behavior is normal without agitation   Micro: none  Cardiac tracings I have personally interpreted today:  none  Pertinent Radiological findings (summarize): none   Lab Results  Component Value Date   WBC 5.5 11/09/2023   HGB 11.7 (L) 11/09/2023   HCT 36.7 11/09/2023   PLT 361.0 11/09/2023   GLUCOSE 124 (H) 11/09/2023   CHOL 175 11/09/2023   TRIG 126.0 11/09/2023   HDL 65.80 11/09/2023   LDLDIRECT 122.7 07/01/2011   LDLCALC 84 11/09/2023   ALT 20 11/09/2023   AST 25 11/09/2023   NA 139 11/09/2023   K 3.6 11/09/2023   CL 101 11/09/2023   CREATININE 0.67 11/09/2023   BUN 10 11/09/2023   CO2 32 11/09/2023   TSH 0.99 11/09/2023   INR 0.94 10/01/2014   HGBA1C 6.0 11/09/2023   MICROALBUR 1.7 11/09/2023   Assessment/Plan:  Gabrielle Benson is a 68 y.o. Black or African American [2] female with  has a past medical history of Allergic rhinitis, ANA positive, Anemia, Anxiety, Cervicalgia, Complication of anesthesia, DDD (degenerative disc disease), DJD (degenerative joint disease), Fibromyalgia, GERD (gastroesophageal reflux disease), Hyperlipidemia, Multinodular thyroid , Neuropathy, peripheral, Pneumonia, and Vitamin D  deficiency (03/18/2011).  Encounter for well adult exam with abnormal findings Age and sex appropriate education and counseling updated with regular exercise and diet Referrals for preventative services - has mammogram sched for next wk Immunizations addressed - none needed Smoking counseling  - none needed Evidence for depression or other mood disorder - none significant Most recent labs reviewed. I have personally reviewed and have noted: 1) the patient's medical and social history 2) The patient's current medications and supplements 3) The patient's height, weight, and BMI have been recorded in the chart   Vitamin D   deficiency Last vitamin D  Lab Results  Component Value Date   VD25OH 25.02 (L) 11/09/2023   Low, to increase oral replacement to 4000 u qd  Prediabetes Lab Results  Component Value Date   HGBA1C 6.0 11/09/2023   Stable, pt to continue current medical treatment  - diet, wt control   Hyperlipidemia Lab Results  Component Value Date   LDLCALC 84 11/09/2023   Stable, pt to continue current statin lipitor 20 mg qd   Elevated PTHrP level Lab Results  Component Value Date   PTH 83 (H) 11/09/2023   CALCIUM  9.4 11/09/2023   CALCIUM  10.5 11/09/2023   Improved and essentially stable, cont to follow  Depression Chronic stable, cont current med tx  Followup: Return in about 1 year (around 11/10/2024).  Lynwood Rush, MD 11/11/2023 1:04 PM [facility]

## 2023-11-11 NOTE — Assessment & Plan Note (Signed)
 Last vitamin D  Lab Results  Component Value Date   VD25OH 25.02 (L) 11/09/2023   Low, to increase oral replacement to 4000 u qd

## 2023-11-11 NOTE — Assessment & Plan Note (Signed)
 Age and sex appropriate education and counseling updated with regular exercise and diet Referrals for preventative services - has mammogram sched for next wk Immunizations addressed - none needed Smoking counseling  - none needed Evidence for depression or other mood disorder - none significant Most recent labs reviewed. I have personally reviewed and have noted: 1) the patient's medical and social history 2) The patient's current medications and supplements 3) The patient's height, weight, and BMI have been recorded in the chart

## 2023-11-11 NOTE — Assessment & Plan Note (Signed)
 Lab Results  Component Value Date   PTH 83 (H) 11/09/2023   CALCIUM  9.4 11/09/2023   CALCIUM  10.5 11/09/2023   Improved and essentially stable, cont to follow

## 2023-11-24 NOTE — Progress Notes (Unsigned)
 Office Visit Note  Patient: Gabrielle Benson             Date of Birth: 1955/06/06           MRN: [id number]             PCP: Norleen Lynwood ORN, MD Referring: Norleen Lynwood ORN, MD Visit Date: 12/08/2023 Occupation: Data Unavailable  Subjective:  Trochanteric bursitis of both hips   History of Present Illness: Gabrielle Benson is a 68 y.o. female with history of fibromyalgia and osteoarthritis. Patient had bilateral trochanteric bursa cortisone injections in May 2025.  Patient states that she had relief for about 2 months but her symptoms have recurred.  She requested repeat bilateral trochanteric bursa cortisone injections today.  Patient has been using Voltaren  gel as needed as well as Biofreeze topically.  She continues to have generalized myalgias and muscle tenderness due to fibromyalgia.  She has been under increased stress over the past several months which has contributed to some of her increased myofascial pain and fatigue. Patient continues to take robaxin  as needed for muscle spasms.  She continues to take lyrica  as prescribed.  She is scheduled for a left wrist injection with Dr. Shari in early December 2025.     Activities of Daily Living:  Patient reports morning stiffness for 24 hours.   Patient Reports nocturnal pain.  Difficulty dressing/grooming: Denies Difficulty climbing stairs: Denies Difficulty getting out of chair: Denies Difficulty using hands for taps, buttons, cutlery, and/or writing: Reports  Review of Systems  Constitutional:  Positive for fatigue.  HENT:  Positive for mouth dryness. Negative for mouth sores.   Eyes:  Positive for dryness.  Respiratory:  Negative for shortness of breath.   Cardiovascular:  Positive for chest pain. Negative for palpitations.  Gastrointestinal:  Positive for constipation. Negative for blood in stool and diarrhea.  Endocrine: Negative for increased urination.  Genitourinary:  Negative for involuntary urination.   Musculoskeletal:  Positive for joint pain, gait problem, joint pain, joint swelling, myalgias, muscle weakness, morning stiffness, muscle tenderness and myalgias.  Skin:  Negative for color change, rash, hair loss and sensitivity to sunlight.  Allergic/Immunologic: Negative for susceptible to infections.  Neurological:  Negative for dizziness and headaches.  Hematological:  Negative for swollen glands.  Psychiatric/Behavioral:  Positive for sleep disturbance. Negative for depressed mood. The patient is nervous/anxious.     PMFS History:  Patient Active Problem List   Diagnosis Date Noted   Multinodular goiter 02/22/2023   Postmenopausal osteoporosis 02/22/2023   Prediabetes 02/22/2023   Primary hyperparathyroidism 02/22/2023   Acute dysfunction of right eustachian tube 11/09/2022   Bilateral leg pain 07/18/2022   Elevated PTHrP level 11/02/2021   Depression 11/02/2021   Hypercalcemia 10/23/2019   DJD (degenerative joint disease), cervical 10/05/2016   DDD (degenerative disc disease), lumbar 10/05/2016   Primary osteoarthritis of left knee 10/05/2016   Acute left-sided low back pain 03/25/2016   Pain, joint, multiple sites 03/25/2016   History of insomnia 03/25/2016   Greater trochanteric bursitis of both hips 03/25/2016   Chronic pain syndrome 10/11/2015   Memory dysfunction 04/01/2015   Status post right partial knee replacement 10/07/2014   GERD (gastroesophageal reflux disease) 11/02/2012   Anxiety 07/01/2011   Vitamin D  deficiency 03/18/2011   Encounter for well adult exam with abnormal findings 03/13/2011   PERIPHERAL NEUROPATHY 10/03/2007   Hyperlipidemia 01/05/2007   ANEMIA-IRON DEFICIENCY 01/05/2007   INSOMNIA-SLEEP DISORDER-UNSPEC 01/05/2007   Allergic rhinitis 01/05/2007   Irritable bowel syndrome  with constipation 01/05/2007   Fibromyalgia syndrome 01/05/2007    Past Medical History:  Diagnosis Date   Allergic rhinitis    ANA positive    Anemia    Iron  deficiency   Anxiety    Cervicalgia    Chronic   Complication of anesthesia    hard to go to sleep with Anesthesia-had to have Benadryl  with Colonoscopy   DDD (degenerative disc disease)    Cervical   DJD (degenerative joint disease)    Fibromyalgia    GERD (gastroesophageal reflux disease)    Hyperlipidemia    Multinodular thyroid     Neuropathy, peripheral    Pneumonia    Vitamin D  deficiency 03/18/2011    Family History  Problem Relation Age of Onset   Heart disease Father    Sickle cell trait Other    Lung cancer Other    Colon cancer Other        Aunt   Diabetes Mother    Healthy Daughter    Healthy Son    Stomach cancer Neg Hx    Rectal cancer Neg Hx    Esophageal cancer Neg Hx    Past Surgical History:  Procedure Laterality Date   ABDOMINAL HYSTERECTOMY     ABDOMINAL HYSTERECTOMY     partial   BUNIONECTOMY  2011   left foot   DILATION AND CURETTAGE OF UTERUS     KNEE ARTHROSCOPY  2004   right   MYOMECTOMY  2002   prior to hysterectomy   PARTIAL KNEE ARTHROPLASTY Right 10/07/2014   Procedure: RIGHT UNI KNEE ARTHROPLASTY LATERALLY ;  Surgeon: Donnice Car, MD;  Location: [facility];  Service: Orthopedics;  Laterality: Right;   TONSILLECTOMY     age 56   TOTAL SHOULDER ARTHROPLASTY Left 04/28/2017   Social History   Tobacco Use   Smoking status: Never    Passive exposure: Current   Smokeless tobacco: Never  Vaping Use   Vaping status: Never Used  Substance Use Topics   Alcohol use: Yes    Alcohol/week: 3.0 - 4.0 standard drinks of alcohol    Types: 3 - 4 Glasses of wine per week    Comment: occ   Drug use: No   Social History   Social History Narrative   Married 2 kids   Receptionist at [employer] History  Administered Date(s) Administered    sv, Bivalent, Protein Subunit Rsvpref,pf (Abrysvo) 03/26/2023   INFLUENZA, HIGH DOSE SEASONAL PF 04/15/2017   Influenza Whole 11/22/2006   Influenza,inj,Quad PF,6+ Mos 10/29/2020, 10/26/2023    Influenza-Unspecified 11/22/2016, 10/18/2017, 11/02/2018, 11/13/2019, 10/18/2020, 10/29/2021, 11/10/2022   PFIZER(Purple Top)SARS-COV-2 Vaccination 04/28/2019, 05/21/2019, 06/29/2019, 02/23/2020   PNEUMOCOCCAL CONJUGATE-20 11/05/2022   Td 01/19/1992, 09/06/2014   Tdap 07/01/2011   Zoster Recombinant(Shingrix) 10/23/2019, 01/04/2020     Objective: Vital Signs: BP 128/84   Pulse (!) 59   Temp (!) 97.4 F (36.3 C)   Resp 14   Ht 5' 3 (1.6 m)   Wt 183 lb 9.6 oz (83.3 kg)   BMI 32.52 kg/m    Physical Exam Vitals and nursing note reviewed.  Constitutional:      Appearance: She is well-developed.  HENT:     Head: Normocephalic and atraumatic.  Eyes:     Conjunctiva/sclera: Conjunctivae normal.  Cardiovascular:     Rate and Rhythm: Normal rate and regular rhythm.     Heart sounds: Normal heart sounds.  Pulmonary:     Effort: Pulmonary effort is  normal.     Breath sounds: Normal breath sounds.  Abdominal:     General: Bowel sounds are normal.     Palpations: Abdomen is soft.  Musculoskeletal:     Cervical back: Normal range of motion.  Lymphadenopathy:     Cervical: No cervical adenopathy.  Skin:    General: Skin is warm and dry.     Capillary Refill: Capillary refill takes less than 2 seconds.  Neurological:     Mental Status: She is alert and oriented to person, place, and time.  Psychiatric:        Behavior: Behavior normal.      Musculoskeletal Exam: Generalized hyperalgesia and positive tender points on exam.  C-spine, thoracic spine, lumbar spine have good range of motion.  No midline spinal tenderness.  No SI joint tenderness.  Shoulder joints, elbow joints, wrist joints, MCPs, PIPs, DIPs have good range of motion with no synovitis.  Complete fist formation bilaterally.  Hip joints have good range of motion with no groin pain.  Knee joints have good range of motion no warmth or effusion.  Right knee has a partial replacement.  Ankle joints have good range of motion no  tenderness or joint swelling.   Tenderness of both trochanteric bursa.    CDAI Exam: CDAI Score: -- Patient Global: --; Provider Global: -- Swollen: --; Tender: -- Joint Exam 12/08/2023   No joint exam has been documented for this visit   There is currently no information documented on the homunculus. Go to the Rheumatology activity and complete the homunculus joint exam.  Investigation: No additional findings.  Imaging: No results found.  Recent Labs: Lab Results  Component Value Date   WBC 5.5 11/09/2023   HGB 11.7 (L) 11/09/2023   PLT 361.0 11/09/2023   NA 139 11/09/2023   K 3.6 11/09/2023   CL 101 11/09/2023   CO2 32 11/09/2023   GLUCOSE 124 (H) 11/09/2023   BUN 10 11/09/2023   CREATININE 0.67 11/09/2023   BILITOT 0.4 11/09/2023   ALKPHOS 99 11/09/2023   AST 25 11/09/2023   ALT 20 11/09/2023   PROT 7.7 11/09/2023   ALBUMIN 4.4 11/09/2023   CALCIUM  9.4 11/09/2023   CALCIUM  10.5 11/09/2023   GFRAA >60 08/28/2016    Speciality Comments: No specialty comments available.  Procedures:  Large Joint Inj: bilateral greater trochanter on 12/08/2023 8:44 AM Indications: pain Details: 27 G 1.5 in needle, lateral approach  Arthrogram: No  Medications (Right): 1.5 mL lidocaine  1 %; 40 mg triamcinolone  acetonide 40 MG/ML Aspirate (Right): 0 mL Medications (Left): 1.5 mL lidocaine  1 %; 40 mg triamcinolone  acetonide 40 MG/ML Aspirate (Left): 0 mL Outcome: tolerated well, no immediate complications Procedure, treatment alternatives, risks and benefits explained, specific risks discussed. Consent was given by the patient. Immediately prior to procedure a time out was called to verify the correct patient, procedure, equipment, support staff and site/side marked as required. Patient was prepped and draped in the usual sterile fashion.     Allergies: Ampicillin, Duloxetine, and Penicillins   Assessment / Plan:     Visit Diagnoses: Fibromyalgia syndrome: Generalized  hyperalgesia and positive tender points on exam.  Patient continues to have generalized myalgias and muscle tenderness due to fibromyalgia.  Patient remains on Lyrica  150 mg 1 capsule twice daily.  She also takes methocarbamol  500 mg 1 tablet daily as needed for muscle spasms.  She has been applying Biofreeze and Voltaren  gel topically as needed for pain relief.  Discussed the importance  of regular exercise and good sleep hygiene.  She recently renewed her gym membership and is planning to start exercising on regular basis.  Primary insomnia: Discussed importance of good sleep hygiene.  Other fatigue: Discussed importance of regular exercise.  Patient plans on starting to go to the gym on a regular basis for exercise.  Greater trochanteric bursitis of both hips -She presents today with a recurrence of pain on the lateral aspect of both hips consistent with trochanteric bursitis.  She had bilateral trochanteric bursa cortisone injections on 06/10/2023 which provided relief for about 2 months but her symptoms have recurred.  She requested repeat trochanteric bursa cortisone injections today.  She tolerated procedures well.  Procedure notes were completed above.  Aftercare was discussed.  She was advised notify us  if she develops any new or worsening symptoms.  Plan: Large Joint Inj: bilateral greater trochanter  Primary osteoarthritis of left knee: Intermittent discomfort especially when climbing steps.  No warmth or effusion noted today.  Status post right partial knee replacement: Doing well.  No warmth or effusion noted.  DDD (degenerative disc disease), cervical: Good range of motion with no discomfort at this time.  Degeneration of intervertebral disc of lumbar region without discogenic back pain or lower extremity pain: No symptoms of radiculopathy.  History of scoliosis  History of neuropathy: She remains on Lyrica  as prescribed.  History of chronic pain: She takes Lyrica  as  prescribed.  Other medical conditions are listed as follows:   History of anxiety  History of vitamin D  deficiency  Osteopenia of multiple sites - January 07, 2023 T-score -1.8, BMD 0.584 the left radius one third.  Hypercalcemia  Orders: Orders Placed This Encounter  Procedures   Large Joint Inj: bilateral greater trochanter   No orders of the defined types were placed in this encounter.    Follow-Up Instructions: Return in about 6 months (around 06/06/2024) for Fibromyalgia, Osteoarthritis.   Waddell CHRISTELLA Craze, PA-C  Note - This record has been created using Dragon software.  Chart creation errors have been sought, but may not always  have been located. Such creation errors do not reflect on  the standard of medical care.

## 2023-11-25 LAB — HM MAMMOGRAPHY

## 2023-12-08 ENCOUNTER — Encounter: Payer: Self-pay | Admitting: Physician Assistant

## 2023-12-08 ENCOUNTER — Ambulatory Visit: Attending: Physician Assistant | Admitting: Physician Assistant

## 2023-12-08 VITALS — BP 128/84 | HR 59 | Temp 97.4°F | Resp 14 | Ht 63.0 in | Wt 183.6 lb

## 2023-12-08 DIAGNOSIS — F5101 Primary insomnia: Secondary | ICD-10-CM | POA: Diagnosis not present

## 2023-12-08 DIAGNOSIS — M7061 Trochanteric bursitis, right hip: Secondary | ICD-10-CM | POA: Diagnosis not present

## 2023-12-08 DIAGNOSIS — R5383 Other fatigue: Secondary | ICD-10-CM | POA: Diagnosis not present

## 2023-12-08 DIAGNOSIS — Z8639 Personal history of other endocrine, nutritional and metabolic disease: Secondary | ICD-10-CM

## 2023-12-08 DIAGNOSIS — M51369 Other intervertebral disc degeneration, lumbar region without mention of lumbar back pain or lower extremity pain: Secondary | ICD-10-CM

## 2023-12-08 DIAGNOSIS — M797 Fibromyalgia: Secondary | ICD-10-CM | POA: Diagnosis not present

## 2023-12-08 DIAGNOSIS — M7062 Trochanteric bursitis, left hip: Secondary | ICD-10-CM

## 2023-12-08 DIAGNOSIS — M8589 Other specified disorders of bone density and structure, multiple sites: Secondary | ICD-10-CM

## 2023-12-08 DIAGNOSIS — Z87898 Personal history of other specified conditions: Secondary | ICD-10-CM

## 2023-12-08 DIAGNOSIS — Z8669 Personal history of other diseases of the nervous system and sense organs: Secondary | ICD-10-CM

## 2023-12-08 DIAGNOSIS — M5030 Other cervical disc degeneration, unspecified cervical region: Secondary | ICD-10-CM

## 2023-12-08 DIAGNOSIS — Z8739 Personal history of other diseases of the musculoskeletal system and connective tissue: Secondary | ICD-10-CM

## 2023-12-08 DIAGNOSIS — Z8659 Personal history of other mental and behavioral disorders: Secondary | ICD-10-CM

## 2023-12-08 DIAGNOSIS — Z96651 Presence of right artificial knee joint: Secondary | ICD-10-CM

## 2023-12-08 DIAGNOSIS — M1712 Unilateral primary osteoarthritis, left knee: Secondary | ICD-10-CM

## 2023-12-08 MED ORDER — TRIAMCINOLONE ACETONIDE 40 MG/ML IJ SUSP
40.0000 mg | INTRAMUSCULAR | Status: AC | PRN
  Administered 2023-12-08: 40 mg via INTRA_ARTICULAR

## 2023-12-08 MED ORDER — LIDOCAINE HCL 1 % IJ SOLN
1.5000 mL | INTRAMUSCULAR | Status: AC | PRN
  Administered 2023-12-08: 1.5 mL

## 2023-12-09 ENCOUNTER — Telehealth: Payer: Self-pay | Admitting: Internal Medicine

## 2023-12-09 NOTE — Telephone Encounter (Signed)
 Patient dropped off document Physical Form, to be filled out by provider. Patient requested to send it back via Fax within 7-days. Document is located in providers tray at front office.Please advise at [phone]   Pt states that form needs date added

## 2023-12-13 NOTE — Telephone Encounter (Signed)
 Called and let Pt know her form is ready for pick up.

## 2023-12-20 ENCOUNTER — Encounter: Payer: Self-pay | Admitting: Emergency Medicine

## 2023-12-20 ENCOUNTER — Ambulatory Visit

## 2023-12-20 ENCOUNTER — Ambulatory Visit
Admission: EM | Admit: 2023-12-20 | Discharge: 2023-12-20 | Disposition: A | Discharge status: Home / Self Care | Attending: Internal Medicine | Admitting: Internal Medicine

## 2023-12-20 ENCOUNTER — Other Ambulatory Visit: Payer: Self-pay

## 2023-12-20 DIAGNOSIS — R0789 Other chest pain: Secondary | ICD-10-CM

## 2023-12-20 DIAGNOSIS — M79632 Pain in left forearm: Secondary | ICD-10-CM

## 2023-12-20 DIAGNOSIS — M79645 Pain in left finger(s): Secondary | ICD-10-CM

## 2023-12-20 DIAGNOSIS — M79644 Pain in right finger(s): Secondary | ICD-10-CM
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
## 2023-12-20 NOTE — Discharge Instructions (Addendum)
 X-rays are negative for any fracture.  Suspect bruising.  May apply ice to areas.  Follow-up with orthopedist if pain persists or worsens.

## 2024-01-19 ENCOUNTER — Other Ambulatory Visit: Payer: Self-pay | Admitting: Internal Medicine

## 2024-01-20 ENCOUNTER — Other Ambulatory Visit: Payer: Self-pay

## 2024-01-27 ENCOUNTER — Telehealth: Admitting: Nurse Practitioner

## 2024-01-27 DIAGNOSIS — B9689 Other specified bacterial agents as the cause of diseases classified elsewhere: Secondary | ICD-10-CM | POA: Insufficient documentation

## 2024-01-27 DIAGNOSIS — J208 Acute bronchitis due to other specified organisms: Secondary | ICD-10-CM

## 2024-01-27 MED ORDER — AZITHROMYCIN 250 MG PO TABS: ORAL_TABLET | ORAL | 0 refills | Status: AC

## 2024-01-27 MED ORDER — HYDROCODONE BIT-HOMATROP MBR 5-1.5 MG/5ML PO SOLN: 5.0000 mL | Freq: Three times a day (TID) | ORAL | 0 refills | Status: AC | PRN

## 2024-01-27 NOTE — Progress Notes (Signed)
" ° °  Established Patient Office Visit  An audio/visual tele-health visit was completed today for this patient. I connected with  Gabrielle Benson on 01/27/2024 utilizing audio/visual technology and verified that I am speaking with the correct person using two identifiers. The patient was located at their home, and I was located at the office of [facility] at [facility] during the encounter. I discussed the limitations of evaluation and management by telemedicine. The patient expressed understanding and agreed to proceed.     Subjective   Patient ID: Gabrielle Benson, female    DOB: 26-Jul-1955  Age: 69 y.o. MRN: [id number]  Chief Complaint  Patient presents with   Cough    Been going on for about two weeks    Discussed the use of AI scribe software for clinical note transcription with the patient, who gave verbal consent to proceed.  History of Present Illness Gabrielle Benson is a 69 year old female who presents with a hacking cough and sore throat for a week and a half.  Cough and throat inflammation - Hacking cough and sore throat for approximately 1.5 weeks - No fever or chills - Mucinex taken without symptom relief - Z-Pak and Hycodan cough syrup have provided effective relief for similar symptoms in the past      Review of Systems  Constitutional:  Negative for chills and fever.  HENT:  Positive for sore throat.   Respiratory:  Positive for cough and sputum production. Negative for shortness of breath.   Cardiovascular:  Negative for chest pain and palpitations.      Objective:     There were no vitals taken for this visit.   Physical Exam Comprehensive physical exam not completed today as office visit was conducted remotely.  No evidence of acute distress on video.  Patient was alert and oriented, and appeared to have appropriate judgment.   No results found for any visits on 01/27/24.    The 10-year ASCVD risk score (Arnett DK, et al.,  2019) is: 10.2%    Assessment & Plan:   Problem List Items Addressed This Visit       Respiratory   Acute bacterial bronchitis - Primary   Acute bacterial bronchitis Cough and inflamed throat for 1.5 weeks. Previous azithromycin  and hydrocodone  with homatropine effective.  - Prescribed azithromycin  (Z-Pak): two tablets on day one, then one tablet daily thereafter until course is complete. - Prescribed hydrocodone  with homatropine syrup: one teaspoon every eight hours as needed. - Advised against driving, operating heavy machinery, and consuming alcohol while taking hydrocodone  with homatropine.      Relevant Medications   azithromycin  (ZITHROMAX ) 250 MG tablet   HYDROcodone  bit-homatropine (HYCODAN) 5-1.5 MG/5ML syrup  Assessment and Plan Assessment & Plan Acute bacterial bronchitis Cough and inflamed throat for 1.5 weeks. Previous azithromycin  and hydrocodone  with homatropine effective.  - Prescribed azithromycin  (Z-Pak): two tablets on day one, then one tablet daily thereafter until course is complete. - Prescribed hydrocodone  with homatropine syrup: one teaspoon every eight hours as needed. - Advised against driving, operating heavy machinery, and consuming alcohol while taking hydrocodone  with homatropine.    Return if symptoms worsen or fail to improve.    Lauraine FORBES Pereyra, NP  "

## 2024-01-27 NOTE — Assessment & Plan Note (Signed)
 Acute bacterial bronchitis Cough and inflamed throat for 1.5 weeks. Previous azithromycin  and hydrocodone  with homatropine effective.  - Prescribed azithromycin  (Z-Pak): two tablets on day one, then one tablet daily thereafter until course is complete. - Prescribed hydrocodone  with homatropine syrup: one teaspoon every eight hours as needed. - Advised against driving, operating heavy machinery, and consuming alcohol while taking hydrocodone  with homatropine.

## 2024-02-15 ENCOUNTER — Encounter: Payer: Self-pay | Admitting: Internal Medicine

## 2024-06-12 ENCOUNTER — Ambulatory Visit: Admitting: Rheumatology

## 2024-11-16 ENCOUNTER — Encounter: Admitting: Internal Medicine
